# Patient Record
Sex: Female | Born: 1945 | Race: Black or African American | Hispanic: No | Smoking: Never smoker
Health system: Southern US, Community
[De-identification: ages and names within clinical notes are randomized; demographics above are authoritative.]

## PROBLEM LIST (undated history)

## (undated) DIAGNOSIS — F32A Depression, unspecified: Secondary | ICD-10-CM

## (undated) DIAGNOSIS — F329 Major depressive disorder, single episode, unspecified: Secondary | ICD-10-CM

## (undated) DIAGNOSIS — E119 Type 2 diabetes mellitus without complications: Secondary | ICD-10-CM

## (undated) DIAGNOSIS — E785 Hyperlipidemia, unspecified: Secondary | ICD-10-CM

## (undated) DIAGNOSIS — I10 Essential (primary) hypertension: Secondary | ICD-10-CM

## (undated) DIAGNOSIS — N022 Recurrent and persistent hematuria with diffuse membranous glomerulonephritis: Secondary | ICD-10-CM

## (undated) HISTORY — DX: Major depressive disorder, single episode, unspecified: F32.9

## (undated) HISTORY — DX: Hyperlipidemia, unspecified: E78.5

## (undated) HISTORY — DX: Depression, unspecified: F32.A

## (undated) HISTORY — DX: Type 2 diabetes mellitus without complications: E11.9

## (undated) HISTORY — DX: Recurrent and persistent hematuria with diffuse membranous glomerulonephritis: N02.2

## (undated) HISTORY — DX: Essential (primary) hypertension: I10

---

## 1997-08-25 ENCOUNTER — Other Ambulatory Visit: Admission: RE | Admit: 1997-08-25 | Discharge: 1997-08-25 | Payer: Self-pay | Admitting: Obstetrics

## 1997-09-23 ENCOUNTER — Observation Stay (HOSPITAL_COMMUNITY): Admission: AD | Admit: 1997-09-23 | Discharge: 1997-09-24 | Payer: Self-pay | Admitting: Diagnostic Radiology

## 1998-09-23 ENCOUNTER — Other Ambulatory Visit: Admission: RE | Admit: 1998-09-23 | Discharge: 1998-09-23 | Payer: Self-pay | Admitting: Obstetrics

## 1999-08-03 ENCOUNTER — Other Ambulatory Visit: Admission: RE | Admit: 1999-08-03 | Discharge: 1999-08-03 | Payer: Self-pay | Admitting: Obstetrics

## 1999-08-09 ENCOUNTER — Encounter: Payer: Self-pay | Admitting: Obstetrics

## 1999-08-09 ENCOUNTER — Ambulatory Visit (HOSPITAL_COMMUNITY): Admission: RE | Admit: 1999-08-09 | Discharge: 1999-08-09 | Payer: Self-pay | Admitting: Obstetrics

## 2000-01-04 ENCOUNTER — Encounter: Admission: RE | Admit: 2000-01-04 | Discharge: 2000-01-04 | Payer: Self-pay | Admitting: Obstetrics

## 2000-01-04 ENCOUNTER — Encounter: Payer: Self-pay | Admitting: Obstetrics

## 2001-02-07 ENCOUNTER — Encounter: Payer: Self-pay | Admitting: Obstetrics

## 2001-02-07 ENCOUNTER — Ambulatory Visit (HOSPITAL_COMMUNITY): Admission: RE | Admit: 2001-02-07 | Discharge: 2001-02-07 | Payer: Self-pay | Admitting: Obstetrics

## 2001-02-12 ENCOUNTER — Encounter: Payer: Self-pay | Admitting: Obstetrics

## 2001-02-12 ENCOUNTER — Ambulatory Visit (HOSPITAL_COMMUNITY): Admission: RE | Admit: 2001-02-12 | Discharge: 2001-02-12 | Payer: Self-pay | Admitting: Obstetrics

## 2002-09-12 ENCOUNTER — Emergency Department (HOSPITAL_COMMUNITY): Admission: EM | Admit: 2002-09-12 | Discharge: 2002-09-12 | Payer: Self-pay | Admitting: Emergency Medicine

## 2002-09-12 ENCOUNTER — Encounter: Payer: Self-pay | Admitting: Emergency Medicine

## 2004-04-06 ENCOUNTER — Other Ambulatory Visit: Admission: RE | Admit: 2004-04-06 | Discharge: 2004-04-06 | Payer: Self-pay | Admitting: Obstetrics and Gynecology

## 2006-08-17 ENCOUNTER — Emergency Department (HOSPITAL_COMMUNITY): Admission: EM | Admit: 2006-08-17 | Discharge: 2006-08-17 | Payer: Self-pay | Admitting: Family Medicine

## 2006-08-23 ENCOUNTER — Emergency Department (HOSPITAL_COMMUNITY): Admission: EM | Admit: 2006-08-23 | Discharge: 2006-08-23 | Payer: Self-pay | Admitting: Family Medicine

## 2008-03-05 ENCOUNTER — Ambulatory Visit: Payer: Self-pay | Admitting: Family Medicine

## 2008-03-16 ENCOUNTER — Ambulatory Visit: Payer: Self-pay | Admitting: *Deleted

## 2008-04-01 ENCOUNTER — Ambulatory Visit: Payer: Self-pay | Admitting: Family Medicine

## 2008-04-01 LAB — CONVERTED CEMR LAB
BUN: 17 mg/dL (ref 6–23)
Basophils Relative: 0 % (ref 0–1)
CO2: 22 meq/L (ref 19–32)
Calcium: 8.5 mg/dL (ref 8.4–10.5)
Chloride: 108 meq/L (ref 96–112)
Cholesterol: 398 mg/dL — ABNORMAL HIGH (ref 0–200)
Cocaine Metabolites: NEGATIVE
Creatinine, Ser: 0.81 mg/dL (ref 0.40–1.20)
Creatinine,U: 112.9 mg/dL
HDL: 73 mg/dL (ref >39)
Hemoglobin: 14.3 g/dL (ref 12.0–15.0)
Lymphocytes Relative: 38 % (ref 12–46)
MCHC: 34.5 g/dL (ref 30.0–36.0)
Marijuana Metabolite: POSITIVE — AB
Monocytes Absolute: 0.4 10*3/uL (ref 0.1–1.0)
Monocytes Relative: 6 % (ref 3–12)
Neutro Abs: 3.1 10*3/uL (ref 1.7–7.7)
Opiate Screen, Urine: NEGATIVE
Phencyclidine (PCP): NEGATIVE
RBC: 4.73 M/uL (ref 3.87–5.11)
Total CHOL/HDL Ratio: 5.5
Triglycerides: 326 mg/dL — ABNORMAL HIGH (ref <150)

## 2008-04-07 ENCOUNTER — Ambulatory Visit: Payer: Self-pay | Admitting: Internal Medicine

## 2008-04-21 ENCOUNTER — Encounter: Payer: Self-pay | Admitting: Family Medicine

## 2008-04-21 LAB — CONVERTED CEMR LAB
Creatinine 24 HR UR: 957 mg/24hr (ref 700–1800)
Creatinine Clearance: 82 mL/min (ref 75–115)

## 2008-04-29 ENCOUNTER — Ambulatory Visit: Payer: Self-pay | Admitting: Family Medicine

## 2008-05-07 ENCOUNTER — Ambulatory Visit: Payer: Self-pay | Admitting: Family Medicine

## 2008-05-25 ENCOUNTER — Ambulatory Visit: Payer: Self-pay | Admitting: Internal Medicine

## 2008-07-01 ENCOUNTER — Emergency Department (HOSPITAL_COMMUNITY): Admission: EM | Admit: 2008-07-01 | Discharge: 2008-07-01 | Payer: Self-pay | Admitting: Family Medicine

## 2008-07-27 ENCOUNTER — Ambulatory Visit: Payer: Self-pay | Admitting: Family Medicine

## 2008-07-27 LAB — CONVERTED CEMR LAB
AST: 28 units/L (ref 0–37)
Albumin: 2.7 g/dL — ABNORMAL LOW (ref 3.5–5.2)
Alkaline Phosphatase: 66 units/L (ref 39–117)
BUN: 14 mg/dL (ref 6–23)
HDL: 70 mg/dL (ref >39)
LDL Cholesterol: 132 mg/dL — ABNORMAL HIGH (ref 0–99)
Microalb, Ur: 379.4 mg/dL — ABNORMAL HIGH (ref 0.00–1.89)
Potassium: 4.4 meq/L (ref 3.5–5.3)
Sodium: 142 meq/L (ref 135–145)
Total Bilirubin: 0.5 mg/dL (ref 0.3–1.2)
VLDL: 25 mg/dL (ref 0–40)

## 2008-08-03 ENCOUNTER — Ambulatory Visit: Payer: Self-pay | Admitting: Family Medicine

## 2008-08-24 ENCOUNTER — Ambulatory Visit: Payer: Self-pay | Admitting: Internal Medicine

## 2008-08-25 ENCOUNTER — Ambulatory Visit: Payer: Self-pay | Admitting: Internal Medicine

## 2008-09-07 ENCOUNTER — Ambulatory Visit: Payer: Self-pay | Admitting: Internal Medicine

## 2008-09-09 ENCOUNTER — Ambulatory Visit (HOSPITAL_COMMUNITY): Admission: RE | Admit: 2008-09-09 | Discharge: 2008-09-09 | Payer: Self-pay | Admitting: Family Medicine

## 2008-09-09 ENCOUNTER — Ambulatory Visit: Payer: Self-pay | Admitting: Internal Medicine

## 2008-09-30 ENCOUNTER — Ambulatory Visit: Payer: Self-pay | Admitting: Internal Medicine

## 2008-10-09 ENCOUNTER — Ambulatory Visit (HOSPITAL_COMMUNITY): Admission: RE | Admit: 2008-10-09 | Discharge: 2008-10-09 | Payer: Self-pay | Admitting: Internal Medicine

## 2008-11-04 ENCOUNTER — Ambulatory Visit: Payer: Self-pay | Admitting: Internal Medicine

## 2008-11-12 ENCOUNTER — Ambulatory Visit (HOSPITAL_COMMUNITY): Admission: RE | Admit: 2008-11-12 | Discharge: 2008-11-12 | Payer: Self-pay | Admitting: Nephrology

## 2008-11-12 ENCOUNTER — Encounter (INDEPENDENT_AMBULATORY_CARE_PROVIDER_SITE_OTHER): Payer: Self-pay | Admitting: Diagnostic Radiology

## 2008-11-12 DIAGNOSIS — N022 Recurrent and persistent hematuria with diffuse membranous glomerulonephritis: Secondary | ICD-10-CM

## 2008-11-12 HISTORY — DX: Recurrent and persistent hematuria with diffuse membranous glomerulonephritis: N02.2

## 2009-03-23 ENCOUNTER — Ambulatory Visit: Payer: Self-pay | Admitting: Family Medicine

## 2009-04-12 ENCOUNTER — Encounter: Payer: Self-pay | Admitting: Family Medicine

## 2009-04-12 ENCOUNTER — Ambulatory Visit: Payer: Self-pay | Admitting: Family Medicine

## 2009-04-12 LAB — CONVERTED CEMR LAB
Albumin: 3.6 g/dL (ref 3.5–5.2)
Calcium: 8.9 mg/dL (ref 8.4–10.5)
Chloride: 108 meq/L (ref 96–112)
Collection Interval-CRCL: 24 hr
Creatinine Clearance: 127 mL/min — ABNORMAL HIGH (ref 75–115)
Creatinine, Ser: 0.74 mg/dL (ref 0.40–1.20)
Phosphorus: 4.8 mg/dL — ABNORMAL HIGH (ref 2.3–4.6)

## 2009-05-07 ENCOUNTER — Ambulatory Visit: Payer: Self-pay | Admitting: Family Medicine

## 2009-05-31 ENCOUNTER — Ambulatory Visit: Payer: Self-pay | Admitting: Internal Medicine

## 2009-05-31 ENCOUNTER — Encounter (INDEPENDENT_AMBULATORY_CARE_PROVIDER_SITE_OTHER): Payer: Self-pay | Admitting: Family Medicine

## 2009-05-31 LAB — CONVERTED CEMR LAB
Albumin: 3.5 g/dL (ref 3.5–5.2)
BUN: 18 mg/dL (ref 6–23)
CO2: 22 meq/L (ref 19–32)
Calcium: 8.5 mg/dL (ref 8.4–10.5)
Chloride: 109 meq/L (ref 96–112)
Creatinine, Ser: 0.75 mg/dL (ref 0.40–1.20)
Creatinine, Urine: 94.8 mg/dL
Glucose, Bld: 172 mg/dL — ABNORMAL HIGH (ref 70–99)
Phosphorus: 3.8 mg/dL (ref 2.3–4.6)
Potassium: 4.3 meq/L (ref 3.5–5.3)
Total Protein, Urine: 245

## 2009-08-24 ENCOUNTER — Ambulatory Visit: Payer: Self-pay | Admitting: Family Medicine

## 2009-08-24 LAB — CONVERTED CEMR LAB: Microalb, Ur: 195.91 mg/dL — ABNORMAL HIGH (ref 0.00–1.89)

## 2009-09-24 ENCOUNTER — Ambulatory Visit (HOSPITAL_COMMUNITY): Admission: RE | Admit: 2009-09-24 | Discharge: 2009-09-24 | Payer: Self-pay | Admitting: Internal Medicine

## 2009-09-29 ENCOUNTER — Encounter (INDEPENDENT_AMBULATORY_CARE_PROVIDER_SITE_OTHER): Payer: Self-pay | Admitting: Family Medicine

## 2009-09-29 ENCOUNTER — Ambulatory Visit: Payer: Self-pay | Admitting: Internal Medicine

## 2009-09-29 LAB — CONVERTED CEMR LAB
Albumin: 4.3 g/dL (ref 3.5–5.2)
BUN: 29 mg/dL — ABNORMAL HIGH (ref 6–23)
Creatinine, Ser: 0.96 mg/dL (ref 0.40–1.20)
Hgb A1c MFr Bld: 6.6 % — ABNORMAL HIGH (ref <5.7)
Phosphorus: 5 mg/dL — ABNORMAL HIGH (ref 2.3–4.6)

## 2009-11-30 ENCOUNTER — Ambulatory Visit: Payer: Self-pay | Admitting: Family Medicine

## 2010-02-04 ENCOUNTER — Encounter (INDEPENDENT_AMBULATORY_CARE_PROVIDER_SITE_OTHER): Payer: Self-pay | Admitting: Family Medicine

## 2010-02-04 ENCOUNTER — Other Ambulatory Visit: Admission: RE | Admit: 2010-02-04 | Discharge: 2010-02-04 | Payer: Self-pay | Admitting: Family Medicine

## 2010-02-04 LAB — CONVERTED CEMR LAB
Albumin: 4.3 g/dL (ref 3.5–5.2)
BUN: 15 mg/dL (ref 6–23)
CO2: 29 meq/L (ref 19–32)
Calcium: 9.3 mg/dL (ref 8.4–10.5)
Glucose, Bld: 113 mg/dL — ABNORMAL HIGH (ref 70–99)
Total Protein, Urine: 102

## 2010-02-17 ENCOUNTER — Encounter (INDEPENDENT_AMBULATORY_CARE_PROVIDER_SITE_OTHER): Payer: Self-pay | Admitting: Family Medicine

## 2010-02-17 LAB — CONVERTED CEMR LAB
CO2: 25 meq/L (ref 19–32)
Calcium: 9.4 mg/dL (ref 8.4–10.5)
Chloride: 106 meq/L (ref 96–112)
Glucose, Bld: 95 mg/dL (ref 70–99)
HDL: 52 mg/dL (ref >39)
Hgb A1c MFr Bld: 6 % — ABNORMAL HIGH (ref <5.7)
LDL Cholesterol: 109 mg/dL — ABNORMAL HIGH (ref 0–99)
Sodium: 141 meq/L (ref 135–145)
Total CHOL/HDL Ratio: 3.9

## 2010-03-03 ENCOUNTER — Ambulatory Visit: Payer: Self-pay | Admitting: Family Medicine

## 2010-06-08 ENCOUNTER — Encounter (INDEPENDENT_AMBULATORY_CARE_PROVIDER_SITE_OTHER): Payer: Self-pay | Admitting: Family Medicine

## 2010-06-08 LAB — CONVERTED CEMR LAB
ALT: 26 units/L (ref 0–35)
AST: 30 units/L (ref 0–37)
Alkaline Phosphatase: 51 units/L (ref 39–117)
CO2: 26 meq/L (ref 19–32)
Creatinine, Ser: 0.85 mg/dL (ref 0.40–1.20)
LDL Cholesterol: 63 mg/dL (ref 0–99)
Phosphorus: 4.4 mg/dL (ref 2.3–4.6)
Sodium: 142 meq/L (ref 135–145)
Total Bilirubin: 0.9 mg/dL (ref 0.3–1.2)
Total CHOL/HDL Ratio: 2.5
Total Protein: 6.7 g/dL (ref 6.0–8.3)
VLDL: 22 mg/dL (ref 0–40)

## 2010-06-10 ENCOUNTER — Encounter (INDEPENDENT_AMBULATORY_CARE_PROVIDER_SITE_OTHER): Payer: Self-pay | Admitting: Family Medicine

## 2010-06-10 LAB — CONVERTED CEMR LAB
ALT: 28 units/L (ref 0–35)
AST: 34 units/L (ref 0–37)
Albumin: 4.3 g/dL (ref 3.5–5.2)
Alkaline Phosphatase: 52 units/L (ref 39–117)
Creatinine, Urine: 53.5 mg/dL
Glucose, Bld: 75 mg/dL (ref 70–99)
Potassium: 4.9 meq/L (ref 3.5–5.3)
Protein, 24H Urine: 1554 mg/24hr — ABNORMAL HIGH (ref 50–100)
Sodium: 141 meq/L (ref 135–145)
Total Bilirubin: 0.7 mg/dL (ref 0.3–1.2)
Total Protein: 7 g/dL (ref 6.0–8.3)

## 2010-06-13 ENCOUNTER — Encounter: Payer: Self-pay | Admitting: Internal Medicine

## 2010-07-19 ENCOUNTER — Ambulatory Visit (HOSPITAL_COMMUNITY)
Admission: RE | Admit: 2010-07-19 | Discharge: 2010-07-19 | Disposition: A | Discharge status: Home / Self Care | Payer: Medicare Other | Source: Ambulatory Visit | Attending: Gastroenterology | Admitting: Gastroenterology

## 2010-07-19 ENCOUNTER — Other Ambulatory Visit: Payer: Self-pay | Admitting: Gastroenterology

## 2010-07-19 DIAGNOSIS — D126 Benign neoplasm of colon, unspecified: Secondary | ICD-10-CM | POA: Insufficient documentation

## 2010-07-19 DIAGNOSIS — Z01812 Encounter for preprocedural laboratory examination: Secondary | ICD-10-CM | POA: Insufficient documentation

## 2010-07-19 DIAGNOSIS — Z1211 Encounter for screening for malignant neoplasm of colon: Secondary | ICD-10-CM | POA: Insufficient documentation

## 2010-07-19 LAB — GLUCOSE, CAPILLARY: Glucose-Capillary: 109 mg/dL — ABNORMAL HIGH (ref 70–99)

## 2010-08-29 LAB — CBC
Hemoglobin: 12.8 g/dL (ref 12.0–15.0)
RDW: 13.6 % (ref 11.5–15.5)
WBC: 5.9 10*3/uL (ref 4.0–10.5)

## 2010-08-29 LAB — APTT: aPTT: 27 seconds (ref 24–37)

## 2010-08-29 LAB — GLUCOSE, CAPILLARY: Glucose-Capillary: 101 mg/dL — ABNORMAL HIGH (ref 70–99)

## 2010-08-29 LAB — PROTIME-INR: INR: 1 (ref 0.00–1.49)

## 2010-10-25 ENCOUNTER — Other Ambulatory Visit: Payer: Self-pay | Admitting: Family Medicine

## 2011-02-02 IMAGING — US US RENAL
1 series · 14 of 21 positions shown · non-contrast
Comparison: None

CLINICAL DATA: Proteinuria

RENAL/URINARY TRACT ULTRASOUND COMPLETE

[Series 1: us renal · 0.28mm/px · 14 of 21 slices shown]
[im 1/21]
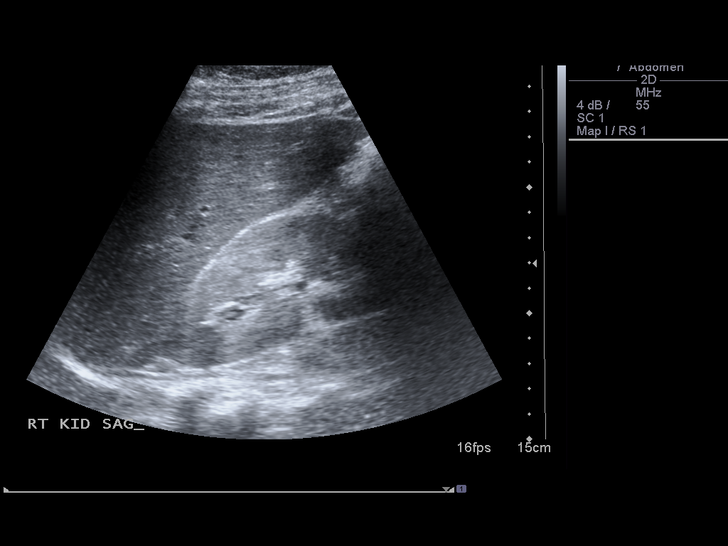
[im 3/21]
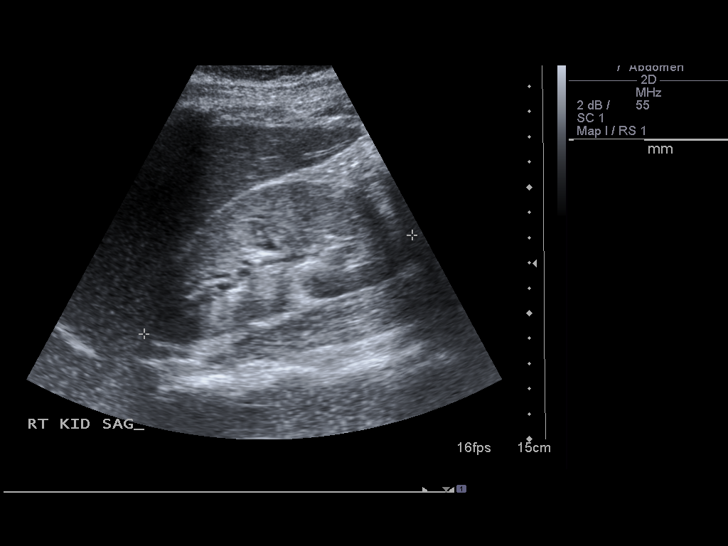
[im 4/21]
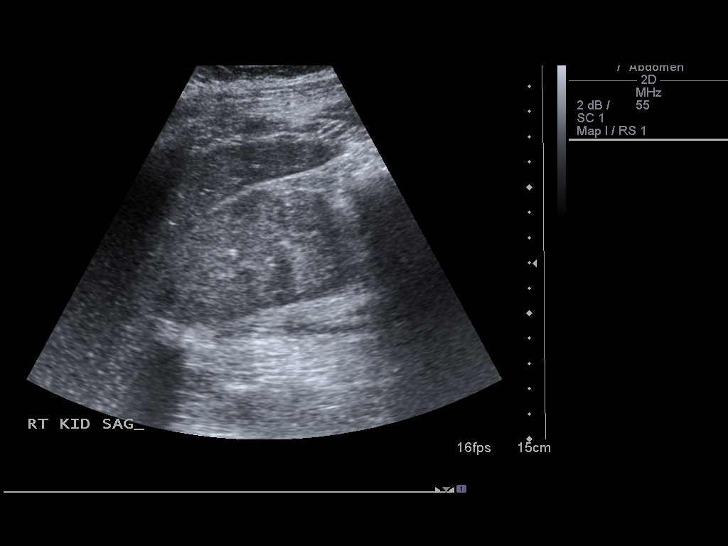
[im 6/21]
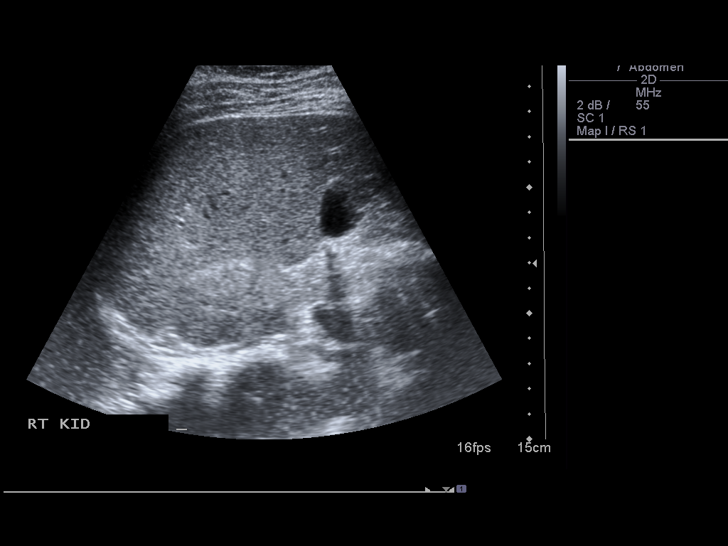
[im 7/21]
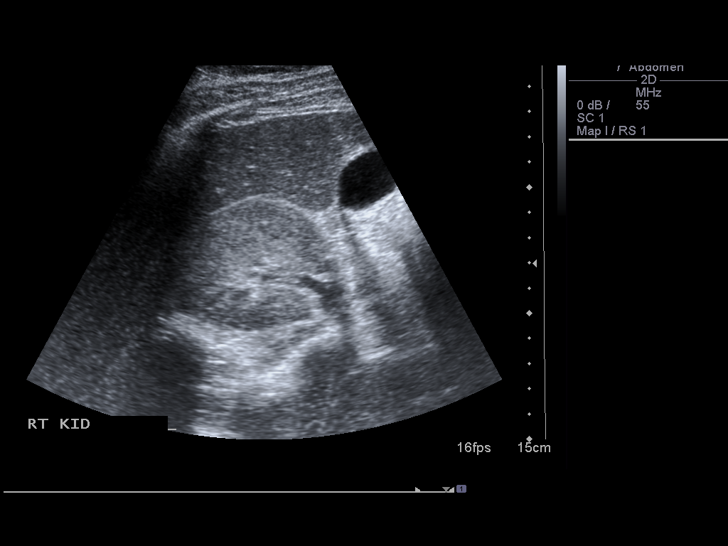
[im 9/21]
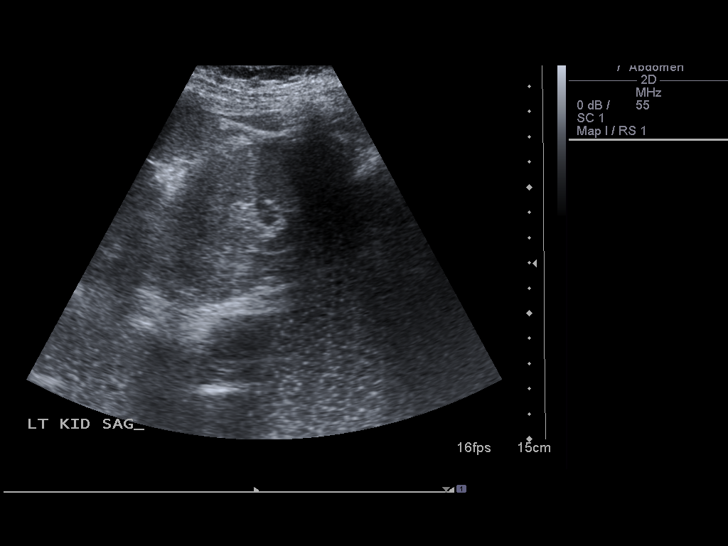
[im 10/21]
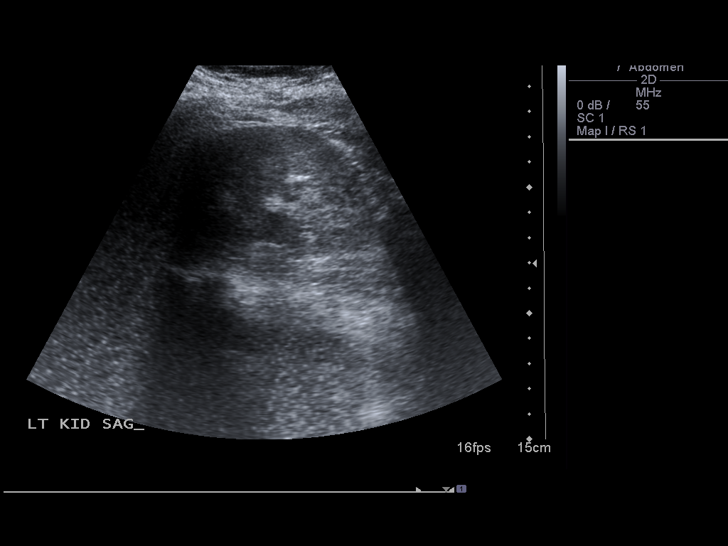
[im 12/21]
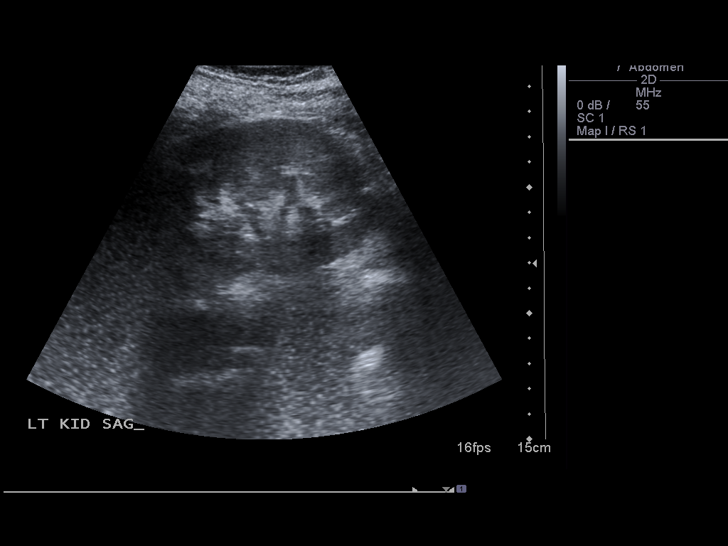
[im 13/21]
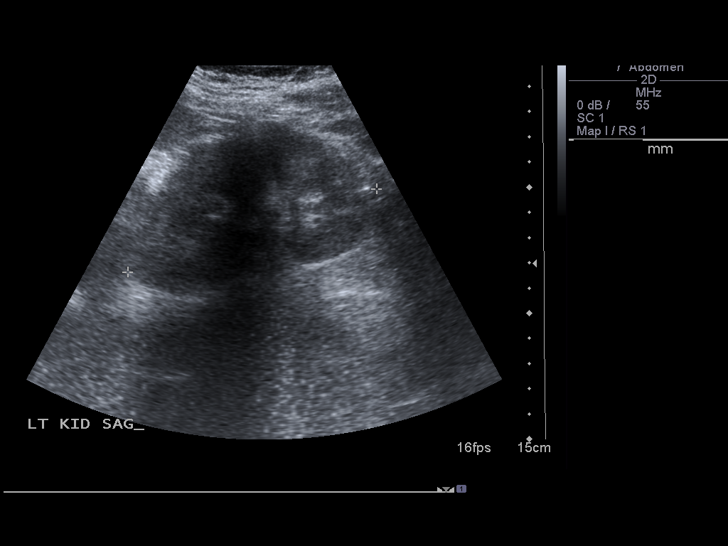
[im 15/21]
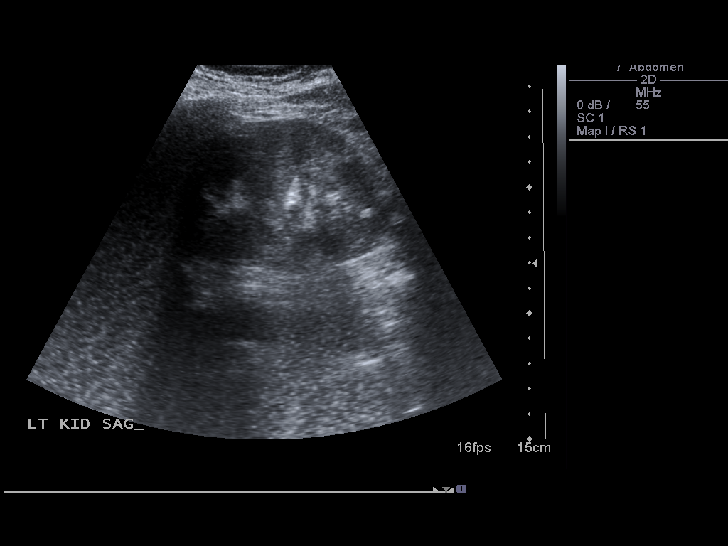
[im 16/21]
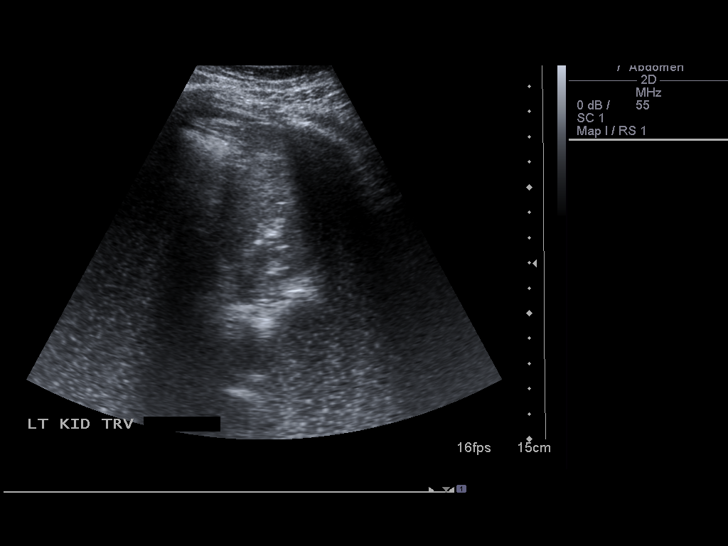
[im 18/21]
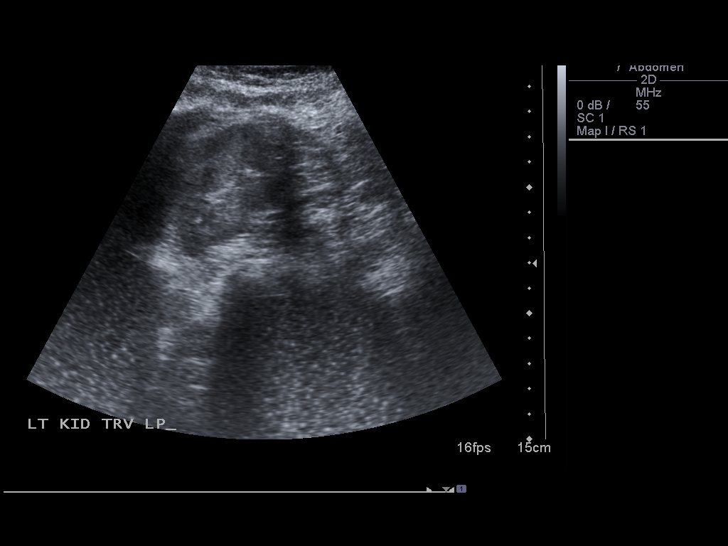
[im 19/21]
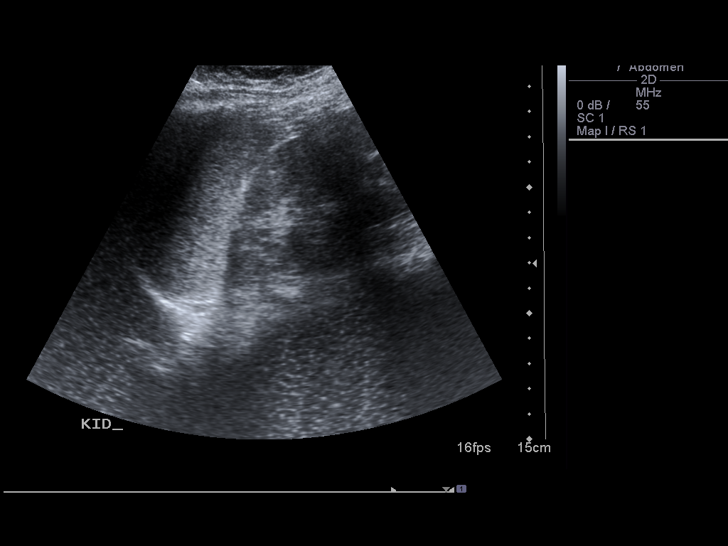
[im 21/21]
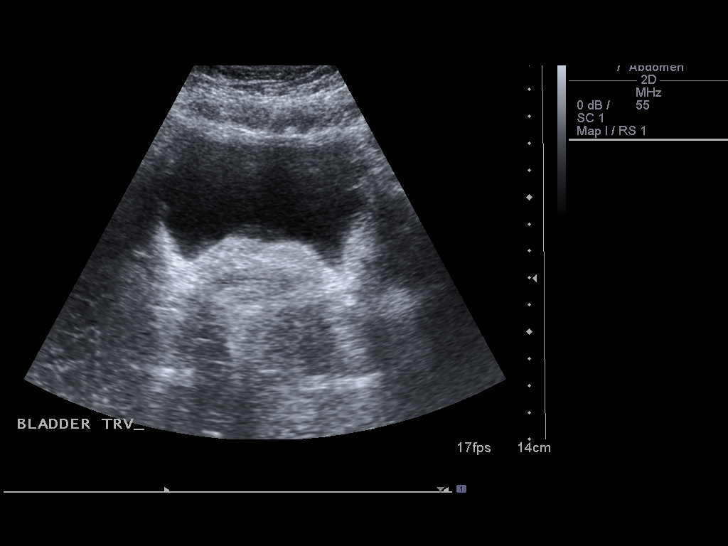

[14 of 21 positions shown; findings below may reference images not displayed]

FINDINGS: Right Kidney:  Normal in size and parenchymal echogenicity.  No
evidence of mass or hydronephrosis.

Left Kidney:  Normal in size and parenchymal echogenicity.  No
evidence of mass or hydronephrosis.

Bladder:  Appears normal for degree of bladder distention.
IMPRESSION: Normal study.

## 2011-11-07 ENCOUNTER — Other Ambulatory Visit (HOSPITAL_COMMUNITY)
Admission: RE | Admit: 2011-11-07 | Discharge: 2011-11-07 | Disposition: A | Discharge status: Home / Self Care | Payer: Medicare Other | Source: Ambulatory Visit | Attending: Family Medicine | Admitting: Family Medicine

## 2011-11-07 ENCOUNTER — Other Ambulatory Visit (HOSPITAL_COMMUNITY): Payer: Self-pay | Admitting: Family Medicine

## 2011-11-07 ENCOUNTER — Other Ambulatory Visit: Payer: Self-pay

## 2011-11-07 DIAGNOSIS — Z124 Encounter for screening for malignant neoplasm of cervix: Secondary | ICD-10-CM | POA: Insufficient documentation

## 2011-11-07 DIAGNOSIS — Z1231 Encounter for screening mammogram for malignant neoplasm of breast: Secondary | ICD-10-CM

## 2011-12-01 ENCOUNTER — Ambulatory Visit (HOSPITAL_COMMUNITY)
Admission: RE | Admit: 2011-12-01 | Discharge: 2011-12-01 | Disposition: A | Discharge status: Home / Self Care | Payer: Medicare Other | Source: Ambulatory Visit | Attending: Family Medicine | Admitting: Family Medicine

## 2011-12-01 DIAGNOSIS — Z1231 Encounter for screening mammogram for malignant neoplasm of breast: Secondary | ICD-10-CM | POA: Insufficient documentation

## 2011-12-06 ENCOUNTER — Other Ambulatory Visit: Payer: Self-pay | Admitting: Family Medicine

## 2011-12-06 DIAGNOSIS — R928 Other abnormal and inconclusive findings on diagnostic imaging of breast: Secondary | ICD-10-CM

## 2011-12-18 ENCOUNTER — Ambulatory Visit
Admission: RE | Admit: 2011-12-18 | Discharge: 2011-12-18 | Disposition: A | Discharge status: Home / Self Care | Payer: Medicare Other | Source: Ambulatory Visit | Attending: Family Medicine | Admitting: Family Medicine

## 2011-12-18 DIAGNOSIS — R928 Other abnormal and inconclusive findings on diagnostic imaging of breast: Secondary | ICD-10-CM

## 2013-02-21 ENCOUNTER — Other Ambulatory Visit: Payer: Self-pay

## 2013-02-21 DIAGNOSIS — Z1231 Encounter for screening mammogram for malignant neoplasm of breast: Secondary | ICD-10-CM

## 2013-02-26 ENCOUNTER — Ambulatory Visit
Admission: RE | Admit: 2013-02-26 | Discharge: 2013-02-26 | Disposition: A | Discharge status: Home / Self Care | Payer: Medicare Other | Source: Ambulatory Visit

## 2013-02-26 DIAGNOSIS — Z1231 Encounter for screening mammogram for malignant neoplasm of breast: Secondary | ICD-10-CM

## 2013-03-31 ENCOUNTER — Encounter: Payer: Self-pay | Admitting: *Deleted

## 2013-05-27 ENCOUNTER — Encounter (INDEPENDENT_AMBULATORY_CARE_PROVIDER_SITE_OTHER): Payer: Self-pay

## 2013-05-27 ENCOUNTER — Ambulatory Visit (HOSPITAL_COMMUNITY)
Admission: RE | Admit: 2013-05-27 | Discharge: 2013-05-27 | Disposition: A | Discharge status: Home / Self Care | Payer: Medicare HMO | Source: Ambulatory Visit | Attending: Internal Medicine | Admitting: Internal Medicine

## 2013-05-27 ENCOUNTER — Other Ambulatory Visit: Payer: Self-pay | Admitting: Internal Medicine

## 2013-05-27 DIAGNOSIS — R52 Pain, unspecified: Secondary | ICD-10-CM

## 2013-05-27 DIAGNOSIS — M25569 Pain in unspecified knee: Secondary | ICD-10-CM | POA: Insufficient documentation

## 2014-04-01 ENCOUNTER — Other Ambulatory Visit: Payer: Self-pay

## 2014-04-01 DIAGNOSIS — Z1231 Encounter for screening mammogram for malignant neoplasm of breast: Secondary | ICD-10-CM

## 2014-05-05 ENCOUNTER — Ambulatory Visit
Admission: RE | Admit: 2014-05-05 | Discharge: 2014-05-05 | Disposition: A | Discharge status: Home / Self Care | Payer: Medicare HMO | Source: Ambulatory Visit

## 2014-05-05 DIAGNOSIS — Z1231 Encounter for screening mammogram for malignant neoplasm of breast: Secondary | ICD-10-CM

## 2015-05-05 DIAGNOSIS — I10 Essential (primary) hypertension: Secondary | ICD-10-CM | POA: Diagnosis not present

## 2015-05-05 DIAGNOSIS — E559 Vitamin D deficiency, unspecified: Secondary | ICD-10-CM | POA: Diagnosis not present

## 2015-05-05 DIAGNOSIS — M150 Primary generalized (osteo)arthritis: Secondary | ICD-10-CM | POA: Diagnosis not present

## 2015-05-05 DIAGNOSIS — E119 Type 2 diabetes mellitus without complications: Secondary | ICD-10-CM | POA: Diagnosis not present

## 2015-05-14 DIAGNOSIS — Z0001 Encounter for general adult medical examination with abnormal findings: Secondary | ICD-10-CM | POA: Diagnosis not present

## 2015-06-28 ENCOUNTER — Other Ambulatory Visit: Payer: Self-pay

## 2015-06-28 DIAGNOSIS — Z1231 Encounter for screening mammogram for malignant neoplasm of breast: Secondary | ICD-10-CM

## 2015-07-06 ENCOUNTER — Ambulatory Visit
Admission: RE | Admit: 2015-07-06 | Discharge: 2015-07-06 | Disposition: A | Discharge status: Home / Self Care | Payer: Medicare HMO | Source: Ambulatory Visit

## 2015-07-06 DIAGNOSIS — Z1231 Encounter for screening mammogram for malignant neoplasm of breast: Secondary | ICD-10-CM | POA: Diagnosis not present

## 2015-11-02 DIAGNOSIS — I129 Hypertensive chronic kidney disease with stage 1 through stage 4 chronic kidney disease, or unspecified chronic kidney disease: Secondary | ICD-10-CM | POA: Diagnosis not present

## 2015-11-02 DIAGNOSIS — E669 Obesity, unspecified: Secondary | ICD-10-CM | POA: Diagnosis not present

## 2015-11-02 DIAGNOSIS — E785 Hyperlipidemia, unspecified: Secondary | ICD-10-CM | POA: Diagnosis not present

## 2015-11-02 DIAGNOSIS — N052 Unspecified nephritic syndrome with diffuse membranous glomerulonephritis: Secondary | ICD-10-CM | POA: Diagnosis not present

## 2015-11-02 DIAGNOSIS — E119 Type 2 diabetes mellitus without complications: Secondary | ICD-10-CM | POA: Diagnosis not present

## 2016-02-14 DIAGNOSIS — E559 Vitamin D deficiency, unspecified: Secondary | ICD-10-CM | POA: Diagnosis not present

## 2016-02-14 DIAGNOSIS — I10 Essential (primary) hypertension: Secondary | ICD-10-CM | POA: Diagnosis not present

## 2016-02-14 DIAGNOSIS — Z0001 Encounter for general adult medical examination with abnormal findings: Secondary | ICD-10-CM | POA: Diagnosis not present

## 2016-02-14 DIAGNOSIS — R7302 Impaired glucose tolerance (oral): Secondary | ICD-10-CM | POA: Diagnosis not present

## 2016-02-14 LAB — VITAMIN D 25 HYDROXY (VIT D DEFICIENCY, FRACTURES): Vit D, 25-Hydroxy: 23.5

## 2016-02-14 LAB — BASIC METABOLIC PANEL
BUN: 19 (ref 4–21)
CREATININE: 0.9 (ref 0.5–1.1)
Glucose: 155
POTASSIUM: 4.2 (ref 3.4–5.3)
Sodium: 145 (ref 137–147)

## 2016-02-14 LAB — CBC AND DIFFERENTIAL
HCT: 37 (ref 36–46)
HEMOGLOBIN: 12.4 (ref 12.0–16.0)
Platelets: 282 (ref 150–399)
WBC: 6.4

## 2016-02-14 LAB — LIPID PANEL
CHOLESTEROL: 207 — AB (ref 0–200)
HDL: 45 (ref 35–70)
LDL Cholesterol: 120
TRIGLYCERIDES: 211 — AB (ref 40–160)

## 2016-02-14 LAB — HEMOGLOBIN A1C: Hemoglobin A1C: 6

## 2016-02-14 LAB — TSH: TSH: 1.84 (ref 0.41–5.90)

## 2016-02-17 DIAGNOSIS — E119 Type 2 diabetes mellitus without complications: Secondary | ICD-10-CM | POA: Diagnosis not present

## 2016-02-17 DIAGNOSIS — I10 Essential (primary) hypertension: Secondary | ICD-10-CM | POA: Diagnosis not present

## 2016-03-08 DIAGNOSIS — Z23 Encounter for immunization: Secondary | ICD-10-CM | POA: Diagnosis not present

## 2016-04-08 DIAGNOSIS — Z1211 Encounter for screening for malignant neoplasm of colon: Secondary | ICD-10-CM | POA: Diagnosis not present

## 2016-05-31 DIAGNOSIS — M25511 Pain in right shoulder: Secondary | ICD-10-CM | POA: Diagnosis not present

## 2016-05-31 DIAGNOSIS — I10 Essential (primary) hypertension: Secondary | ICD-10-CM | POA: Diagnosis not present

## 2016-05-31 DIAGNOSIS — E119 Type 2 diabetes mellitus without complications: Secondary | ICD-10-CM | POA: Diagnosis not present

## 2016-06-26 DIAGNOSIS — E119 Type 2 diabetes mellitus without complications: Secondary | ICD-10-CM | POA: Diagnosis not present

## 2016-08-16 ENCOUNTER — Other Ambulatory Visit: Payer: Self-pay | Admitting: Internal Medicine

## 2016-08-16 DIAGNOSIS — Z1231 Encounter for screening mammogram for malignant neoplasm of breast: Secondary | ICD-10-CM

## 2016-09-05 ENCOUNTER — Ambulatory Visit
Admission: RE | Admit: 2016-09-05 | Discharge: 2016-09-05 | Disposition: A | Discharge status: Home / Self Care | Payer: Medicare HMO | Source: Ambulatory Visit | Attending: Internal Medicine | Admitting: Internal Medicine

## 2016-09-05 DIAGNOSIS — Z1231 Encounter for screening mammogram for malignant neoplasm of breast: Secondary | ICD-10-CM | POA: Diagnosis not present

## 2016-09-26 DIAGNOSIS — E559 Vitamin D deficiency, unspecified: Secondary | ICD-10-CM | POA: Diagnosis not present

## 2016-09-26 DIAGNOSIS — E119 Type 2 diabetes mellitus without complications: Secondary | ICD-10-CM | POA: Diagnosis not present

## 2016-09-26 DIAGNOSIS — I10 Essential (primary) hypertension: Secondary | ICD-10-CM | POA: Diagnosis not present

## 2016-09-26 DIAGNOSIS — E1165 Type 2 diabetes mellitus with hyperglycemia: Secondary | ICD-10-CM | POA: Diagnosis not present

## 2016-09-26 LAB — HEMOGLOBIN A1C: Hemoglobin A1C: 6.8

## 2016-09-26 LAB — CBC AND DIFFERENTIAL
HCT: 36 (ref 36–46)
Hemoglobin: 12.7 (ref 12.0–16.0)
NEUTROS ABS: 4
PLATELETS: 290 (ref 150–399)
WBC: 6.9

## 2016-09-26 LAB — BASIC METABOLIC PANEL
BUN: 15 (ref 4–21)
Creatinine: 0.9 (ref 0.5–1.1)
GLUCOSE: 164
Potassium: 3.5 (ref 3.4–5.3)
Sodium: 140 (ref 137–147)

## 2016-09-26 LAB — LIPID PANEL
Cholesterol: 136 (ref 0–200)
HDL: 50 (ref 35–70)
LDL CALC: 59
Triglycerides: 137 (ref 40–160)

## 2016-09-26 LAB — TSH: TSH: 2.16 (ref 0.41–5.90)

## 2016-09-26 LAB — VITAMIN D 25 HYDROXY (VIT D DEFICIENCY, FRACTURES): VIT D 25 HYDROXY: 37.9

## 2016-10-03 DIAGNOSIS — I10 Essential (primary) hypertension: Secondary | ICD-10-CM | POA: Diagnosis not present

## 2016-10-03 DIAGNOSIS — E119 Type 2 diabetes mellitus without complications: Secondary | ICD-10-CM | POA: Diagnosis not present

## 2016-10-09 DIAGNOSIS — I10 Essential (primary) hypertension: Secondary | ICD-10-CM | POA: Diagnosis not present

## 2016-10-09 DIAGNOSIS — E1165 Type 2 diabetes mellitus with hyperglycemia: Secondary | ICD-10-CM | POA: Diagnosis not present

## 2017-01-08 DIAGNOSIS — E1165 Type 2 diabetes mellitus with hyperglycemia: Secondary | ICD-10-CM | POA: Diagnosis not present

## 2017-01-08 DIAGNOSIS — E119 Type 2 diabetes mellitus without complications: Secondary | ICD-10-CM | POA: Diagnosis not present

## 2017-01-08 DIAGNOSIS — I10 Essential (primary) hypertension: Secondary | ICD-10-CM | POA: Diagnosis not present

## 2017-01-08 LAB — HEMOGLOBIN A1C: Hemoglobin A1C: 6.3

## 2017-01-08 LAB — BASIC METABOLIC PANEL
BUN: 16 (ref 4–21)
CREATININE: 0.8 (ref 0.5–1.1)
Glucose: 130
Potassium: 4.2 (ref 3.4–5.3)
Sodium: 145 (ref 137–147)

## 2017-01-08 LAB — TSH: TSH: 2.27 (ref 0.41–5.90)

## 2017-01-17 DIAGNOSIS — N052 Unspecified nephritic syndrome with diffuse membranous glomerulonephritis: Secondary | ICD-10-CM | POA: Diagnosis not present

## 2017-01-17 DIAGNOSIS — E785 Hyperlipidemia, unspecified: Secondary | ICD-10-CM | POA: Diagnosis not present

## 2017-01-17 DIAGNOSIS — E119 Type 2 diabetes mellitus without complications: Secondary | ICD-10-CM | POA: Diagnosis not present

## 2017-01-17 DIAGNOSIS — I129 Hypertensive chronic kidney disease with stage 1 through stage 4 chronic kidney disease, or unspecified chronic kidney disease: Secondary | ICD-10-CM | POA: Diagnosis not present

## 2017-01-17 DIAGNOSIS — E669 Obesity, unspecified: Secondary | ICD-10-CM | POA: Diagnosis not present

## 2017-03-21 DIAGNOSIS — Z23 Encounter for immunization: Secondary | ICD-10-CM | POA: Diagnosis not present

## 2017-03-21 DIAGNOSIS — Z0001 Encounter for general adult medical examination with abnormal findings: Secondary | ICD-10-CM | POA: Diagnosis not present

## 2017-04-03 DIAGNOSIS — N052 Unspecified nephritic syndrome with diffuse membranous glomerulonephritis: Secondary | ICD-10-CM | POA: Diagnosis not present

## 2017-07-02 ENCOUNTER — Other Ambulatory Visit: Payer: Medicare HMO

## 2017-07-04 ENCOUNTER — Ambulatory Visit (INDEPENDENT_AMBULATORY_CARE_PROVIDER_SITE_OTHER): Payer: Medicare HMO | Admitting: Family Medicine

## 2017-07-04 ENCOUNTER — Encounter: Payer: Self-pay | Admitting: Family Medicine

## 2017-07-04 VITALS — BP 128/86 | HR 65 | Ht 63.0 in | Wt 167.7 lb

## 2017-07-04 DIAGNOSIS — I10 Essential (primary) hypertension: Secondary | ICD-10-CM

## 2017-07-04 DIAGNOSIS — N183 Chronic kidney disease, stage 3 unspecified: Secondary | ICD-10-CM

## 2017-07-04 DIAGNOSIS — E663 Overweight: Secondary | ICD-10-CM

## 2017-07-04 DIAGNOSIS — E1169 Type 2 diabetes mellitus with other specified complication: Secondary | ICD-10-CM | POA: Insufficient documentation

## 2017-07-04 DIAGNOSIS — E0822 Diabetes mellitus due to underlying condition with diabetic chronic kidney disease: Secondary | ICD-10-CM | POA: Diagnosis not present

## 2017-07-04 DIAGNOSIS — R809 Proteinuria, unspecified: Secondary | ICD-10-CM | POA: Insufficient documentation

## 2017-07-04 DIAGNOSIS — E118 Type 2 diabetes mellitus with unspecified complications: Secondary | ICD-10-CM | POA: Insufficient documentation

## 2017-07-04 DIAGNOSIS — E1129 Type 2 diabetes mellitus with other diabetic kidney complication: Secondary | ICD-10-CM | POA: Diagnosis not present

## 2017-07-04 DIAGNOSIS — E119 Type 2 diabetes mellitus without complications: Secondary | ICD-10-CM

## 2017-07-04 DIAGNOSIS — E559 Vitamin D deficiency, unspecified: Secondary | ICD-10-CM | POA: Diagnosis not present

## 2017-07-04 DIAGNOSIS — I152 Hypertension secondary to endocrine disorders: Secondary | ICD-10-CM | POA: Insufficient documentation

## 2017-07-04 DIAGNOSIS — E1159 Type 2 diabetes mellitus with other circulatory complications: Secondary | ICD-10-CM | POA: Diagnosis not present

## 2017-07-04 DIAGNOSIS — R69 Illness, unspecified: Secondary | ICD-10-CM | POA: Diagnosis not present

## 2017-07-04 DIAGNOSIS — F39 Unspecified mood [affective] disorder: Secondary | ICD-10-CM | POA: Diagnosis not present

## 2017-07-04 DIAGNOSIS — E782 Mixed hyperlipidemia: Secondary | ICD-10-CM

## 2017-07-04 LAB — POCT GLYCOSYLATED HEMOGLOBIN (HGB A1C): Hemoglobin A1C: 6.9

## 2017-07-04 NOTE — Progress Notes (Signed)
New patient office visit note:  Impression and Recommendations:    1. Hypertension associated with diabetes ([place])   2. Diabetes mellitus without complication ([place])   3. Diabetes mellitus due to underlying condition with stage 3 chronic kidney disease, without long-term current use of insulin ([place])   4. Microalbuminuria due to type 2 diabetes mellitus ([place])   5. Mixed diabetic hyperlipidemia associated with type 2 diabetes mellitus ([place])   6. Mood disorder ([place])   7. Overweight (BMI 25.0-29.9)   8. Vitamin D insufficiency     1. Diabetes - Advised the patient that an A1C of 6.9 is well controlled for her age.    - However, with her added kidney disease, advised the patient that the better we control her blood sugars, the better her kidney health will be.  Our goal is to prevent worsening kidney disease.  - Discussed with the patient that metformin may be a better medicine to use to control her sugars.    - check her fasting blood sugars on the glucometer regularly, especially when she eats especially bad, or especially good.  Obtain fasting blood sugar readings after a "good meal for dinner" vs a "bad meal for dinner."  If she feels enthusiastic about it, she may also record some 2-hour post-prandial readings.  - Patient will keep a blood glucose log and bring this to her next appointment.  - Noted that alcohol is sugar, and cutting down on alcohol should help to control her sugars.   2. Ambulatory Referral to Opthalmology - She should continue getting routine diabetic eye exams.  3. Kidney Disease - Advised the patient that the better we can control her blood sugar and blood pressure, the better her kidney health will be.  - Reviewed that proteinuria can be caused by both diabetes and high blood pressure.  - Reviewed that losartan can help protect the kidneys, and she currently takes losartan.  4. HTN - We will not make any medication changes at this time, especially  given the fact that she regularly sees nephrology for her blood pressure.  - Per patient, she takes her losartan, but only took HCTZ as needed for swelling in her legs, and only took it occasionally.  - Reviewed that her goal blood pressure should fall consistently under 130/80.  Patient should watch for dizziness or lightheadedness - make sure she does not feel these symptoms.  With her medical history, given her diabetes and kidney concerns, she should have a blood pressure consistently under 130/80.  - To control her blood pressure, recommend meditation, weight loss, diet, exercise, and potentially altered dosage or different medicines.  We will monitor and evaluate this in the future.  - Advised the patient to take her medications as prescribed, every day.  - Patient should check her blood pressure every day, and keep a log to bring in to her next appointment.  Prior to taking her blood pressure readings at home, she should sit for 15 minutes, resting quietly.  She should also not consume caffeine prior to taking her blood pressure readings.  - Educational handout on taking blood pressure readings provided to the patient today.  5. Cholesterol - Advised the patient that she should be taking her Lipitor every night before bed.  6. General health - Advised the patient to continue her active habits, keep up with her exercise and outdoor activities.  7. Left Knee Pain - Since patient mentioned this when we were walking out of the  office visit, advice given to patient and we will bring her back to fully evaluate in the future as needed.  Told patient she is more than welcome to do that in the future as desired.  - If arthritis in her knee flares up, ice for 15-20 minutes.  Also apply ice after exercise, going on the trail, going up and down stairs, to help with the pain.  If the pain doesn't subside, she may add Tylenol.  If the pain still doesn't subside, even after proper treatment, she will  make an appointment to evaluate it.  8. Follow-Up - Patient will return in the near future for fasting blood work, to establish baseline.  Patient knows to come in at her convenience.  - The patient would like to come in only if there's a problem with her lab work.  - Otherwise, we will see her back in 3 months for a check of her A1C and blood pressure, and OV to follow.  - If things are not controlled, she will come in to the clinic sooner than planned.    Education and routine counseling performed. Handouts provided.  Orders Placed This Encounter  Procedures  . CBC with Differential/Platelet  . Comprehensive metabolic panel  . Lipid panel  . Magnesium  . Phosphorus  . TSH  . T4, free  . VITAMIN D 25 Hydroxy (Vit-D Deficiency, Fractures)  . Microalbumin / creatinine urine ratio  . Ambulatory referral to Ophthalmology  . POCT glycosylated hemoglobin (Hb A1C)   Gross side effects, risk and benefits, and alternatives of medications discussed with patient.  Patient is aware that all medications have potential side effects and we are unable to predict every side effect or drug-drug interaction that may occur.  Expresses verbal understanding and consents to current therapy plan and treatment regimen.  Return for diabetes and blood pressure follow up every 1mo;  near future come in for fbw only.  Please see AVS handed out to patient at the end of our visit for further patient instructions/ counseling done pertaining to today's office visit.    Note: This document was prepared using Dragon voice recognition software and may include unintentional dictation errors.   This document serves as a record of services personally performed by Mellody Dance, DO. It was created on her behalf by Toni Amend, a trained medical scribe. The creation of this record is based on the scribe's personal observations and the provider's statements to them.   I have reviewed the above medical  documentation for accuracy and completeness and I concur.  Mellody Dance 07/28/17 12:09 PM    ----------------------------------------------------------------------------------------------------------------------    Subjective:    Chief complaint:   Chief Complaint  Patient presents with  . Establish Care    HPI: Rachel Camacho is a pleasant 72 y.o. female who presents to [place] at [facility] today to review their medical history with me and establish care.   I asked the patient to review their chronic problem list with me to ensure everything was updated and accurate.    All recent office visits with other providers, any medical records that patient brought in etc  - I reviewed today.     We asked pt to get [place] their medical records from [facility] providers/ specialists that they had seen within the past 3-5 years- if they are in private practice and/or do not work for [employer], [facility], [place], [place] or [employer] owned practice.  Told them to call their  specialists to clarify this if they are not sure.   Her previous provider was endocrinologist Dr. Jeanann Lewandowsky. She notes that he retired in December of 2018.  Her last appointment with him was in November of 2018.  To find [place], she researched by calling her healthcare provider, and was given a list of names.  Her last bloodwork was in October  Social History Lives nearby.  From [place]. Born in [place], but moved to [place]. Has 2 sisters; 1 older sister in [place], and 1 younger sister in [place]. Closer with her sister in [place]. She's considering moving back up to [place].  Has lived down here for about 15 years. Came down here to get married; her husband lived here.  Is now divorced, lives alone. No kids.  Has a International aid/development worker, named Sophie. Still working part time.  [employer].  Has a significant other - is not sexually active.  2-4 cups of coffee and tea per day. Drinks  more tea, and more herbal tea - caffeine free. She is now drinking the tea instead of alcohol.  In her free time, she works out, goes to [employer]. Socializes with her friends.  -Tobacco Use Never smoker.  -EtOH Use Drinks on the weekends with her girlfriends. She stopped drinking recently because she felt she was drinking too much. Usually has drinks 3-4 times per day on weekends.  Wine & vodka. Sometimes has 2 glasses of wine on weekdays.  - Physical Activity Very into physical fitness and health.  Works out at [employer], the [employer]. Cardio and weights 1 hour 3-4 times per week, Likes to walk, be out in nature, meditate, and enjoys alternative medicine.   Family History Sisters don't drink. Mother had nothing - wasn't on medication. No depression in the family. No heart attacks at young ages. Unsure if older brother may have had a heart attack or a stroke.  Past Medical History  - Diabetes Past 5 years.  Was first told she had diabetes 5 years ago. Never tried metformin.  Notes that her diabetes is "not that serious." She notes that she was fine until she started snacking on sweets, cookies & candies. At 63.97, a 72 year old is good control.  Previously obtained diabetic eye exams.  - Kidney Disease Has been told she has protein in her urine.  Was initially told this at the same time as her diabetes diagnosis. Protein count was in the 1000's when she first went to see the specialist.  Now it is down very low.  Dr. Lorrene Reid is her kidney specialist.  Nephrologist is the one who is concerned about her blood pressure.  - HTN She checks her blood pressure at the gym before she exercises, and after. Usually runs at 130/80, sometimes up to 140/90 before she exercises.   Notes that her blood pressure always goes down after she exercises.  Is currently taking losartan and HCTZ.  Notes that she's been only taking the HCTZ when she sees her eyes or legs swelling up.  -  Cholesterol Takes Lipitor every morning.  Was advised to take it in the evenings instead.  - Mood Takes citalopram because she felt "down in the dumps." Does not feel anxious; has more negative thoughts.  Was off of it for years, noting that it was a "situational thing." Goes on it during situational stressors; job, men, family.  She continues taking it now because she hasn't been depressed in  months; hasn't had an episode.  - Gynecology Does not have a GYN doctor Still obtains mammograms yearly.  Does not see orthopedics or neurology, or any other specialist.  - General Health & Exercise Into physical fitness and health. Cardio and weights 1 hour 3-4 times per week, Likes to walk, be out in nature, meditate, and enjoys alternative medicine. She has been trying some extra supplements to give her energy and keep her youthful.  Takes a multi vitamin and tries to stay out in the sun as often as she can.  Left Knee Pain Patient mentioned at the very end of the OV that she has stiffness in her knee that is worsened with walking up and down stairs or on the uneven surface of outdoor trails.  Notes that her knee does not hurt while working out at the gym or on a treadmill.  This pain has been going on for many months, and she's never seen a doctor for it prior.  Her sister was worried that she might need a knee replacement and thought she should mention it to a doctor.   Wt Readings from Last 3 Encounters:  07/04/17 167 lb 11.2 oz (76.1 kg)   BP Readings from Last 3 Encounters:  07/04/17 128/86   Pulse Readings from Last 3 Encounters:  07/04/17 65   BMI Readings from Last 3 Encounters:  07/04/17 29.71 kg/m    Patient Care Team    Relationship Specialty Notifications Start End  Mellody Dance, DO PCP - General Family Medicine  07/04/17   Jamal Maes, MD Consulting Physician Nephrology  07/04/17     Patient Active Problem List   Diagnosis Date Noted  . Diabetes  mellitus without complication ([place]) 36/64/4034  . Hypertension associated with diabetes ([place]) 07/04/2017  . Diabetes mellitus due to underlying condition with stage 3 chronic kidney disease, without long-term current use of insulin ([place]) 07/04/2017  . Microalbuminuria due to type 2 diabetes mellitus ([place]) 07/04/2017  . Mixed diabetic hyperlipidemia associated with type 2 diabetes mellitus ([place]) 07/04/2017  . Mood disorder ([place]) 07/04/2017     Past Medical History:  Diagnosis Date  . Depression   . Diabetes mellitus without complication ([place])   . Hyperlipidemia   . Hypertension      Past Medical History:  Diagnosis Date  . Depression   . Diabetes mellitus without complication ([place])   . Hyperlipidemia   . Hypertension      No past surgical history on file.   No family history on file.   Social History   Substance and Sexual Activity  Drug Use No     Social History   Substance and Sexual Activity  Alcohol Use Yes   Comment: social     Social History   Tobacco Use  Smoking Status Never Smoker  Smokeless Tobacco Never Used     Current Meds  Medication Sig  . atorvastatin (LIPITOR) 20 MG tablet Take 20 mg by mouth at bedtime.  . citalopram (CELEXA) 20 MG tablet Take 20 mg by mouth daily.  Marland Kitchen glipiZIDE (GLUCOTROL XL) 5 MG 24 hr tablet Take 2 tablets by mouth daily.  Marland Kitchen losartan (COZAAR) 100 MG tablet Take 100 mg by mouth daily.    Allergies: Penicillins   Review of Systems  Constitutional: Negative for chills, diaphoresis, fever, malaise/fatigue and weight loss.  HENT: Negative for congestion, sore throat and tinnitus.   Eyes: Negative for blurred vision, double vision and photophobia.  Respiratory: Negative for cough and wheezing.  Cardiovascular: Negative for chest pain and palpitations.  Gastrointestinal: Negative for blood in stool, diarrhea, nausea and vomiting.  Genitourinary: Negative for dysuria, frequency and urgency.  Musculoskeletal:  Positive for joint pain (left knee). Negative for myalgias.  Skin: Negative for itching and rash.  Neurological: Negative for dizziness, focal weakness, weakness and headaches.  Endo/Heme/Allergies: Negative for environmental allergies and polydipsia. Does not bruise/bleed easily.  Psychiatric/Behavioral: Negative for depression and memory loss. The patient is not nervous/anxious and does not have insomnia.      Objective:   Blood pressure 128/86, pulse 65, height 5\' 3"  (1.6 m), weight 167 lb 11.2 oz (76.1 kg), SpO2 98 %. Body mass index is 29.71 kg/m. General: Well Developed, well nourished, and in no acute distress.  Neuro: Alert and oriented x3, extra-ocular muscles intact, sensation grossly intact.  HEENT:[place]/AT, PERRLA, neck supple, No carotid bruits Skin: no gross rashes  Cardiac: Regular rate and rhythm Respiratory: Essentially clear to auscultation bilaterally. Not using accessory muscles, speaking in full sentences.  Abdominal: not grossly distended Musculoskeletal: Ambulates w/o diff, FROM * 4 ext.  Vasc: less 2 sec cap RF, warm and pink  Psych:  No HI/SI, judgement and insight good, Euthymic mood. Full Affect.    Recent Results (from the past 2160 hour(s))  POCT glycosylated hemoglobin (Hb A1C)     Status: Abnormal   Collection Time: 07/04/17 11:30 AM  Result Value Ref Range   Hemoglobin A1C 6.9

## 2017-07-04 NOTE — Patient Instructions (Signed)
What I would like you to do over the next several weeks is check your blood pressure at home as well as your fasting blood sugars and if you get really enthusiastic you can occasionally check your 2-hour glucose after your largest meal of the day.    Also in the near future you can come in to get fasting blood work.  I will put in the order today.  You come in at your convenience.  As you are requested we will see you back only if there is problems otherwise I will see you in 3 months for recheck of your A1c blood pressure etc.  If things are not well controlled please contact me and come in to see me sooner than planned   How to Take Your Blood Pressure Blood pressure is a measurement of how strongly your blood is pressing against the walls of your arteries. Arteries are blood vessels that carry blood from your heart throughout your body. Your health care provider takes your blood pressure at each office visit. You can also take your own blood pressure at home with a blood pressure machine. You may need to take your own blood pressure:  To confirm a diagnosis of high blood pressure (hypertension).  To monitor your blood pressure over time.  To make sure your blood pressure medicine is working.  Supplies needed: To take your blood pressure, you will need a blood pressure machine. You can buy a blood pressure machine, or blood pressure monitor, at most drugstores or online. There are several types of home blood pressure monitors. When choosing one, consider the following:  Choose a monitor that has an arm cuff.  Choose a monitor that wraps snugly around your upper arm. You should be able to fit only one finger between your arm and the cuff.  Do not choose a monitor that measures your blood pressure from your wrist or finger.  Your health care provider can suggest a reliable monitor that will meet your needs. How to prepare To get the most accurate reading, avoid the following for 30 minutes  before you check your blood pressure:  Drinking caffeine.  Drinking alcohol.  Eating.  Smoking.  Exercising.  Five minutes before you check your blood pressure:  Empty your bladder.  Sit quietly without talking in a dining chair, rather than in a soft couch or armchair.  How to take your blood pressure To check your blood pressure, follow the instructions in the manual that came with your blood pressure monitor. If you have a digital blood pressure monitor, the instructions may be as follows: 1. Sit up straight. 2. Place your feet on the floor. Do not cross your ankles or legs. 3. Rest your left arm at the level of your heart on a table or desk or on the arm of a chair. 4. Pull up your shirt sleeve. 5. Wrap the blood pressure cuff around the upper part of your left arm, 1 inch (2.5 cm) above your elbow. It is best to wrap the cuff around bare skin. 6. Fit the cuff snugly around your arm. You should be able to place only one finger between the cuff and your arm. 7. Position the cord inside the groove of your elbow. 8. Press the power button. 9. Sit quietly while the cuff inflates and deflates. 10. Read the digital reading on the monitor screen and write it down (record it). 11. Wait 2-3 minutes, then repeat the steps, starting at step 1.  What  does my blood pressure reading mean? A blood pressure reading consists of a higher number over a lower number. Ideally, your blood pressure should be below 120/80. The first ("top") number is called the systolic pressure. It is a measure of the pressure in your arteries as your heart beats. The second ("bottom") number is called the diastolic pressure. It is a measure of the pressure in your arteries as the heart relaxes. Blood pressure is classified into four stages. The following are the stages for adults who do not have a short-term serious illness or a chronic condition. Systolic pressure and diastolic pressure are measured in a unit called  mm Hg. Normal  Systolic pressure: below 527.  Diastolic pressure: below 80. Elevated  Systolic pressure: 782-423.  Diastolic pressure: below 80. Hypertension stage 1  Systolic pressure: 536-144.  Diastolic pressure: 31-54. Hypertension stage 2  Systolic pressure: 008 or above.  Diastolic pressure: 90 or above. You can have prehypertension or hypertension even if only the systolic or only the diastolic number in your reading is higher than normal. Follow these instructions at home:  Check your blood pressure as often as recommended by your health care provider.  Take your monitor to the next appointment with your health care provider to make sure: ? That you are using it correctly. ? That it provides accurate readings.  Be sure you understand what your goal blood pressure numbers are.  Tell your health care provider if you are having any side effects from blood pressure medicine. Contact a health care provider if:  Your blood pressure is consistently high. Get help right away if:  Your systolic blood pressure is higher than 180.  Your diastolic blood pressure is higher than 110. This information is not intended to replace advice given to you by your health care provider. Make sure you discuss any questions you have with your health care provider. Document Released: 10/15/2015 Document Revised: 12/28/2015 Document Reviewed: 10/15/2015 Elsevier Interactive Patient Education  2018 [place].     Blood Glucose Monitoring, Adult  Monitoring your blood glucose (also know as blood sugar) helps you to manage your diabetes. It also helps you and your health care provider monitor your diabetes and determine how well your treatment plan is working.  WHY SHOULD YOU MONITOR YOUR BLOOD GLUCOSE?  It can help you understand how food, exercise, and medicine affect your blood glucose.  It allows you to know what your blood glucose is at any given moment. You can quickly tell  if you are having low blood glucose (hypoglycemia) or high blood glucose (hyperglycemia).  It can help you and your health care provider know how to adjust your medicines.  It can help you understand how to manage an illness or adjust medicine for exercise.  WHEN SHOULD YOU TEST? Your health care provider will help you decide how often you should check your blood glucose. This may depend on the type of diabetes you have, your diabetes control, or the types of medicines you are taking. Be sure to write down all of your blood glucose readings so that this information can be reviewed with your health care provider. See below for examples of testing times that your health care provider may suggest  Type 2 Diabetes  Check your fasting blood sugar ( usually when you get out of bed)  and 2 hours after your largest meal of the day   HOW TO MONITOR YOUR BLOOD GLUCOSE:  Supplies Needed  Blood glucose meter.  Test  strips for your meter. Each meter has its own strips. You must use the strips that go with your own meter.  A pricking needle (lancet).  A device that holds the lancet (lancing device).  A journal or log book to write down your results.   Procedure  Wash your hands with soap and water. Alcohol is not preferred.  Prick the side of your finger (not the tip) with the lancet.  Gently milk the finger until a small drop of blood appears.  Follow the instructions that come with your meter for inserting the test strip, applying blood to the strip, and using your blood glucose meter.  Other Areas to Get Blood for Testing Some meters allow you to use other areas of your body (other than your finger) to test your blood. These areas are called alternative sites. The most common alternative sites are:  The forearm.  The thigh.  The back area of the lower leg.  The palm of the hand.  The blood flow in these areas is slower. Therefore, the blood glucose values you get may be  delayed, and the numbers are different from what you would get from your fingers. Do not use alternative sites if you think you are having hypoglycemia. Your reading will not be accurate. Always use a finger if you are having hypoglycemia. Also, if you cannot feel your lows (hypoglycemia unawareness), always use your fingers for your blood glucose checks.  ADDITIONAL TIPS FOR GLUCOSE MONITORING  Do not reuse lancets.  Always carry your supplies with you.  All blood glucose meters have a 24-hour "hotline" number to call if you have questions or need help.  Adjust (calibrate) your blood glucose meter with a control solution after finishing a few boxes of strips.  BLOOD GLUCOSE RECORD KEEPING It is a good idea to keep a daily record or log of your blood glucose readings. Most glucose meters, if not all, keep your glucose records stored in the meter. Some meters come with the ability to download your records to your home computer. Keeping a record of your blood glucose readings is especially helpful if you are wanting to look for patterns. Make notes to go along with the blood glucose readings because you might forget what happened at that exact time. Keeping good records helps you and your health care provider to work together to achieve good diabetes management.    This information is not intended to replace advice given to you by your health care provider. Make sure you discuss any questions you have with your health care provider.   Document Released: 05/11/2003 Document Revised: 05/29/2014 Document Reviewed: 09/30/2012 Elsevier Interactive Patient Education 2016 [employer].       Please realize, EXERCISE IS MEDICINE!  -  American Heart Association ( AHA) guidelines for exercise : If you are in good health, without any medical conditions, you should engage in 150 minutes of moderate intensity aerobic activity per week.  This means you should be huffing and puffing throughout your  workout.   Engaging in regular exercise will improve brain function and memory, as well as improve mood, boost immune system and help with weight management.  As well as the other, more well-known effects of exercise such as decreasing blood sugar levels, decreasing blood pressure,  and decreasing bad cholesterol levels/ increasing good cholesterol levels.     -  The AHA strongly endorses consumption of a diet that contains a variety of foods from all the food categories with  an emphasis on fruits and vegetables; fat-free and low-fat dairy products; cereal and grain products; legumes and nuts; and fish, poultry, and/or extra lean meats.    Excessive food intake, especially of foods high in saturated and trans fats, sugar, and salt, should be avoided.    Adequate water intake of roughly 1/2 of your weight in pounds, should equal the ounces of water per day you should drink.  So for instance, if you're 200 pounds, that would be 100 ounces of water per day.         Mediterranean Diet  Why follow it? Research shows. . Those who follow the Mediterranean diet have a reduced risk of heart disease  . The diet is associated with a reduced incidence of Parkinson's and Alzheimer's diseases . People following the diet may have longer life expectancies and lower rates of chronic diseases  . The Dietary Guidelines for Americans recommends the Mediterranean diet as an eating plan to promote health and prevent disease  What Is the Mediterranean Diet?  . Healthy eating plan based on typical foods and recipes of Mediterranean-style cooking . The diet is primarily a plant based diet; these foods should make up a majority of meals   Starches - Plant based foods should make up a majority of meals - They are an important sources of vitamins, minerals, energy, antioxidants, and fiber - Choose whole grains, foods high in fiber and minimally processed items  - Typical grain sources include wheat, oats, barley, corn,  brown rice, bulgar, farro, millet, polenta, couscous  - Various types of beans include chickpeas, lentils, fava beans, black beans, white beans   Fruits  Veggies - Large quantities of antioxidant rich fruits & veggies; 6 or more servings  - Vegetables can be eaten raw or lightly drizzled with oil and cooked  - Vegetables common to the traditional Mediterranean Diet include: artichokes, arugula, beets, broccoli, brussel sprouts, cabbage, carrots, celery, collard greens, cucumbers, eggplant, kale, leeks, lemons, lettuce, mushrooms, okra, onions, peas, peppers, potatoes, pumpkin, radishes, rutabaga, shallots, spinach, sweet potatoes, turnips, zucchini - Fruits common to the Mediterranean Diet include: apples, apricots, avocados, cherries, clementines, dates, figs, grapefruits, grapes, melons, nectarines, oranges, peaches, pears, pomegranates, strawberries, tangerines  Fats - Replace butter and margarine with healthy oils, such as olive oil, canola oil, and tahini  - Limit nuts to no more than a handful a day  - Nuts include walnuts, almonds, pecans, pistachios, pine nuts  - Limit or avoid candied, honey roasted or heavily salted nuts - Olives are central to the [employer] - can be eaten whole or used in a variety of dishes   Meats Protein - Limiting red meat: no more than a few times a month - When eating red meat: choose lean cuts and keep the portion to the size of deck of cards - Eggs: approx. 0 to 4 times a week  - Fish and lean poultry: at least 2 a week  - Healthy protein sources include, chicken, [place], lean beef, lamb - Increase intake of seafood such as tuna, salmon, trout, mackerel, shrimp, scallops - Avoid or limit high fat processed meats such as sausage and bacon  Dairy - Include moderate amounts of low fat dairy products  - Focus on healthy dairy such as fat free yogurt, skim milk, low or reduced fat cheese - Limit dairy products higher in fat such as whole or 2% milk, cheese,  ice cream  Alcohol - Moderate amounts of red wine is ok  -  No more than 5 oz daily for women (all ages) and men older than age 44  - No more than 10 oz of wine daily for men younger than 34  Other - Limit sweets and other desserts  - Use herbs and spices instead of salt to flavor foods  - Herbs and spices common to the traditional Mediterranean Diet include: basil, bay leaves, chives, cloves, cumin, fennel, garlic, lavender, marjoram, mint, oregano, parsley, pepper, rosemary, sage, savory, sumac, tarragon, thyme   It's not just a diet, it's a lifestyle:  . The Mediterranean diet includes lifestyle factors typical of those in the region  . Foods, drinks and meals are best eaten with others and savored . Daily physical activity is important for overall good health . This could be strenuous exercise like running and aerobics . This could also be more leisurely activities such as walking, housework, yard-work, or taking the stairs . Moderation is the key; a balanced and healthy diet accommodates most foods and drinks . Consider portion sizes and frequency of consumption of certain foods   Meal Ideas & Options:  . Breakfast:  o Whole wheat toast or whole wheat English muffins with peanut butter & hard boiled egg o Steel cut oats topped with apples & cinnamon and skim milk  o Fresh fruit: banana, strawberries, melon, berries, peaches  o Smoothies: strawberries, bananas, greek yogurt, peanut butter o Low fat greek yogurt with blueberries and granola  o Egg white omelet with spinach and mushrooms o Breakfast couscous: whole wheat couscous, apricots, skim milk, cranberries  . Sandwiches:  o Hummus and grilled vegetables (peppers, zucchini, squash) on whole wheat bread   o Grilled chicken on whole wheat pita with lettuce, tomatoes, cucumbers or tzatziki  o Tuna salad on whole wheat bread: tuna salad made with greek yogurt, olives, red peppers, capers, green onions o Garlic rosemary lamb pita:  lamb sauted with garlic, rosemary, salt & pepper; add lettuce, cucumber, greek yogurt to pita - flavor with lemon juice and black pepper  . Seafood:  o Mediterranean grilled salmon, seasoned with garlic, basil, parsley, lemon juice and black pepper o Shrimp, lemon, and spinach whole-grain pasta salad made with low fat greek yogurt  o Seared scallops with lemon orzo  o Seared tuna steaks seasoned salt, pepper, coriander topped with tomato mixture of olives, tomatoes, olive oil, minced garlic, parsley, green onions and cappers  . Meats:  o Herbed greek chicken salad with kalamata olives, cucumber, feta  o Red bell peppers stuffed with spinach, bulgur, lean ground beef (or lentils) & topped with feta   o Kebabs: skewers of chicken, tomatoes, onions, zucchini, squash  o [place] burgers: made with red onions, mint, dill, lemon juice, feta cheese topped with roasted red peppers . Vegetarian o Cucumber salad: cucumbers, artichoke hearts, celery, red onion, feta cheese, tossed in olive oil & lemon juice  o Hummus and whole grain pita points with a greek salad (lettuce, tomato, feta, olives, cucumbers, red onion) o Lentil soup with celery, carrots made with vegetable broth, garlic, salt and pepper  o Tabouli salad: parsley, bulgur, mint, scallions, cucumbers, tomato, radishes, lemon juice, olive oil, salt and pepper.

## 2017-07-09 ENCOUNTER — Other Ambulatory Visit (INDEPENDENT_AMBULATORY_CARE_PROVIDER_SITE_OTHER): Payer: Medicare HMO

## 2017-07-09 DIAGNOSIS — I152 Hypertension secondary to endocrine disorders: Secondary | ICD-10-CM

## 2017-07-09 DIAGNOSIS — R809 Proteinuria, unspecified: Secondary | ICD-10-CM

## 2017-07-09 DIAGNOSIS — E559 Vitamin D deficiency, unspecified: Secondary | ICD-10-CM

## 2017-07-09 DIAGNOSIS — I10 Essential (primary) hypertension: Secondary | ICD-10-CM

## 2017-07-09 DIAGNOSIS — E1159 Type 2 diabetes mellitus with other circulatory complications: Secondary | ICD-10-CM

## 2017-07-09 DIAGNOSIS — N183 Chronic kidney disease, stage 3 (moderate): Secondary | ICD-10-CM | POA: Diagnosis not present

## 2017-07-09 DIAGNOSIS — E1169 Type 2 diabetes mellitus with other specified complication: Secondary | ICD-10-CM

## 2017-07-09 DIAGNOSIS — E0822 Diabetes mellitus due to underlying condition with diabetic chronic kidney disease: Secondary | ICD-10-CM

## 2017-07-09 DIAGNOSIS — E782 Mixed hyperlipidemia: Secondary | ICD-10-CM

## 2017-07-09 DIAGNOSIS — E663 Overweight: Secondary | ICD-10-CM

## 2017-07-09 DIAGNOSIS — E119 Type 2 diabetes mellitus without complications: Secondary | ICD-10-CM

## 2017-07-09 DIAGNOSIS — F39 Unspecified mood [affective] disorder: Secondary | ICD-10-CM

## 2017-07-09 DIAGNOSIS — E1129 Type 2 diabetes mellitus with other diabetic kidney complication: Secondary | ICD-10-CM

## 2017-07-09 LAB — POCT UA - MICROALBUMIN
Creatinine, POC: 300 mg/dL
Microalbumin Ur, POC: 80 mg/L

## 2017-07-09 NOTE — Addendum Note (Signed)
Addended by: Lanier Prude D on: 07/09/2017 10:39 AM   Modules accepted: Orders

## 2017-07-12 DIAGNOSIS — E1159 Type 2 diabetes mellitus with other circulatory complications: Secondary | ICD-10-CM | POA: Diagnosis not present

## 2017-07-12 DIAGNOSIS — R69 Illness, unspecified: Secondary | ICD-10-CM | POA: Diagnosis not present

## 2017-07-12 DIAGNOSIS — E1169 Type 2 diabetes mellitus with other specified complication: Secondary | ICD-10-CM | POA: Diagnosis not present

## 2017-07-12 DIAGNOSIS — I10 Essential (primary) hypertension: Secondary | ICD-10-CM | POA: Diagnosis not present

## 2017-07-12 DIAGNOSIS — E119 Type 2 diabetes mellitus without complications: Secondary | ICD-10-CM | POA: Diagnosis not present

## 2017-07-12 DIAGNOSIS — R809 Proteinuria, unspecified: Secondary | ICD-10-CM | POA: Diagnosis not present

## 2017-07-12 DIAGNOSIS — E559 Vitamin D deficiency, unspecified: Secondary | ICD-10-CM | POA: Diagnosis not present

## 2017-07-12 DIAGNOSIS — E663 Overweight: Secondary | ICD-10-CM | POA: Diagnosis not present

## 2017-07-12 DIAGNOSIS — E782 Mixed hyperlipidemia: Secondary | ICD-10-CM | POA: Diagnosis not present

## 2017-07-12 DIAGNOSIS — E1129 Type 2 diabetes mellitus with other diabetic kidney complication: Secondary | ICD-10-CM | POA: Diagnosis not present

## 2017-07-13 LAB — CBC WITH DIFFERENTIAL/PLATELET
BASOS ABS: 0 10*3/uL (ref 0.0–0.2)
Basos: 0 %
EOS (ABSOLUTE): 0.2 10*3/uL (ref 0.0–0.4)
Eos: 3 %
Hematocrit: 38.6 % (ref 34.0–46.6)
Hemoglobin: 13.5 g/dL (ref 11.1–15.9)
Immature Grans (Abs): 0 10*3/uL (ref 0.0–0.1)
Immature Granulocytes: 0 %
LYMPHS ABS: 2.5 10*3/uL (ref 0.7–3.1)
Lymphs: 35 %
MCH: 30.2 pg (ref 26.6–33.0)
MCHC: 35 g/dL (ref 31.5–35.7)
MCV: 86 fL (ref 79–97)
MONOS ABS: 0.4 10*3/uL (ref 0.1–0.9)
Monocytes: 5 %
NEUTROS ABS: 4.1 10*3/uL (ref 1.4–7.0)
Neutrophils: 57 %
Platelets: 267 10*3/uL (ref 150–379)
RBC: 4.47 x10E6/uL (ref 3.77–5.28)
RDW: 13 % (ref 12.3–15.4)
WBC: 7.3 10*3/uL (ref 3.4–10.8)

## 2017-07-13 LAB — LIPID PANEL
CHOLESTEROL TOTAL: 147 mg/dL (ref 100–199)
Chol/HDL Ratio: 2.8 ratio (ref 0.0–4.4)
HDL: 53 mg/dL (ref >39)
LDL CALC: 76 mg/dL (ref 0–99)
TRIGLYCERIDES: 92 mg/dL (ref 0–149)
VLDL Cholesterol Cal: 18 mg/dL (ref 5–40)

## 2017-07-13 LAB — MAGNESIUM: MAGNESIUM: 2.1 mg/dL (ref 1.6–2.3)

## 2017-07-13 LAB — COMPREHENSIVE METABOLIC PANEL
ALBUMIN: 4.6 g/dL (ref 3.5–4.8)
ALK PHOS: 66 IU/L (ref 39–117)
ALT: 27 IU/L (ref 0–32)
AST: 22 IU/L (ref 0–40)
Albumin/Globulin Ratio: 1.6 (ref 1.2–2.2)
BILIRUBIN TOTAL: 0.7 mg/dL (ref 0.0–1.2)
BUN / CREAT RATIO: 15 (ref 12–28)
BUN: 14 mg/dL (ref 8–27)
CHLORIDE: 102 mmol/L (ref 96–106)
CO2: 23 mmol/L (ref 20–29)
CREATININE: 0.95 mg/dL (ref 0.57–1.00)
Calcium: 9.4 mg/dL (ref 8.7–10.3)
GFR calc Af Amer: 69 mL/min/{1.73_m2} (ref >59)
GFR calc non Af Amer: 60 mL/min/{1.73_m2} (ref >59)
GLOBULIN, TOTAL: 2.8 g/dL (ref 1.5–4.5)
GLUCOSE: 117 mg/dL — AB (ref 65–99)
Potassium: 4.3 mmol/L (ref 3.5–5.2)
SODIUM: 141 mmol/L (ref 134–144)
Total Protein: 7.4 g/dL (ref 6.0–8.5)

## 2017-07-13 LAB — T4, FREE: FREE T4: 1.06 ng/dL (ref 0.82–1.77)

## 2017-07-13 LAB — PHOSPHORUS: Phosphorus: 4.3 mg/dL (ref 2.5–4.5)

## 2017-07-13 LAB — TSH: TSH: 1.73 u[IU]/mL (ref 0.450–4.500)

## 2017-07-13 LAB — VITAMIN D 25 HYDROXY (VIT D DEFICIENCY, FRACTURES): Vit D, 25-Hydroxy: 26.4 ng/mL — ABNORMAL LOW (ref 30.0–100.0)

## 2017-07-16 ENCOUNTER — Encounter: Payer: Self-pay | Admitting: Family Medicine

## 2017-07-16 DIAGNOSIS — E559 Vitamin D deficiency, unspecified: Secondary | ICD-10-CM | POA: Insufficient documentation

## 2017-07-18 ENCOUNTER — Other Ambulatory Visit: Payer: Self-pay

## 2017-07-18 MED ORDER — VITAMIN D-3 125 MCG (5000 UT) PO TABS: 1.0000 | ORAL_TABLET | Freq: Every day | ORAL | Status: AC

## 2017-08-29 ENCOUNTER — Telehealth: Payer: Self-pay | Admitting: Family Medicine

## 2017-08-29 NOTE — Telephone Encounter (Signed)
Patient is requesting a prescription for One Touch Verio Flex lancets test strips only, if approved please send to [facility] on [address]

## 2017-08-30 ENCOUNTER — Other Ambulatory Visit: Payer: Self-pay

## 2017-08-30 DIAGNOSIS — N183 Chronic kidney disease, stage 3 unspecified: Secondary | ICD-10-CM

## 2017-08-30 DIAGNOSIS — E119 Type 2 diabetes mellitus without complications: Secondary | ICD-10-CM

## 2017-08-30 DIAGNOSIS — E0822 Diabetes mellitus due to underlying condition with diabetic chronic kidney disease: Secondary | ICD-10-CM

## 2017-08-30 MED ORDER — ONETOUCH LANCETS MISC: 5 refills | Status: DC

## 2017-08-30 NOTE — Telephone Encounter (Signed)
Sent into the pharmacy and called the patient to notify. MPulliam, CMA/RT(R)

## 2017-09-03 ENCOUNTER — Other Ambulatory Visit: Payer: Self-pay

## 2017-09-03 ENCOUNTER — Telehealth: Payer: Self-pay | Admitting: Family Medicine

## 2017-09-03 DIAGNOSIS — E0822 Diabetes mellitus due to underlying condition with diabetic chronic kidney disease: Secondary | ICD-10-CM

## 2017-09-03 DIAGNOSIS — N183 Chronic kidney disease, stage 3 unspecified: Secondary | ICD-10-CM

## 2017-09-03 DIAGNOSIS — R69 Illness, unspecified: Secondary | ICD-10-CM | POA: Diagnosis not present

## 2017-09-03 DIAGNOSIS — E119 Type 2 diabetes mellitus without complications: Secondary | ICD-10-CM

## 2017-09-03 MED ORDER — ONETOUCH LANCETS MISC: 5 refills | Status: DC

## 2017-09-03 NOTE — Telephone Encounter (Signed)
Patient called states [facility] went to [facility] [address], no Rx rcvd for:  ONE TOUCH LANCETS [place] [[id number]]   Order Details  Dose, Route, Frequency: As Directed   Dispense Quantity: 200 each Refills: 5 Fills remaining: --        Sig: Use to check glucose levels fasting in the AM and 2 hours after largest meal. Patient requested One Touch Verio Flex lancets.          Or for Test Strips:  Patient uses:  Preferred Pharmacies      [id number] [address], [place] [phone] (Phone) [phone] (Fax)    Please resubmit Rx.  --glh

## 2017-09-10 ENCOUNTER — Telehealth: Payer: Self-pay | Admitting: Family Medicine

## 2017-09-10 NOTE — Telephone Encounter (Signed)
Patient called states she Requested Lancets & Test strips to be called into pharmacy but only lancets were.   Se below: ONE TOUCH LANCETS MISC 200 each 5 09/03/2017    Sig: Use to check glucose levels fasting in the AM and 2 hours after largest meal. Patient requested One Touch Verio Flex lancets.   Sent to pharmacy as: ONE TOUCH LANCETS Misc   E-Prescribing Status: Receipt confirmed by pharmacy (09/03/2017 5:28 PM EDT)    Please sent Rx for One Touch Test strips to:  Preferred Pharmacies      [id number] [address], [place] [phone] (Phone) [phone] (Fax)     --glh

## 2017-09-11 ENCOUNTER — Other Ambulatory Visit: Payer: Self-pay

## 2017-09-11 ENCOUNTER — Other Ambulatory Visit: Payer: Self-pay | Admitting: Family Medicine

## 2017-09-11 DIAGNOSIS — E119 Type 2 diabetes mellitus without complications: Secondary | ICD-10-CM

## 2017-09-11 DIAGNOSIS — R69 Illness, unspecified: Secondary | ICD-10-CM | POA: Diagnosis not present

## 2017-09-11 DIAGNOSIS — Z1231 Encounter for screening mammogram for malignant neoplasm of breast: Secondary | ICD-10-CM

## 2017-09-11 MED ORDER — GLUCOSE BLOOD VI STRP: ORAL_STRIP | 12 refills | Status: DC

## 2017-09-11 MED ORDER — GLUCOSE BLOOD VI STRP
ORAL_STRIP | 12 refills | Status: DC
Start: 2017-09-11 — End: 2017-09-11

## 2017-09-11 NOTE — Telephone Encounter (Signed)
RX for test strips sent to pharmacy. MPulliam, CMA/RT(R)

## 2017-09-11 NOTE — Telephone Encounter (Signed)
Patient needing RX for test strips. MPulliam, CMA/RT(R)

## 2017-09-15 DIAGNOSIS — R69 Illness, unspecified: Secondary | ICD-10-CM | POA: Diagnosis not present

## 2017-10-04 ENCOUNTER — Encounter: Payer: Self-pay | Admitting: Family Medicine

## 2017-10-04 ENCOUNTER — Ambulatory Visit
Admission: RE | Admit: 2017-10-04 | Discharge: 2017-10-04 | Disposition: A | Discharge status: Home / Self Care | Payer: Medicare HMO | Source: Ambulatory Visit | Attending: Family Medicine | Admitting: Family Medicine

## 2017-10-04 ENCOUNTER — Ambulatory Visit (INDEPENDENT_AMBULATORY_CARE_PROVIDER_SITE_OTHER): Payer: Medicare HMO | Admitting: Family Medicine

## 2017-10-04 VITALS — BP 125/76 | HR 69 | Ht 63.0 in | Wt 163.3 lb

## 2017-10-04 DIAGNOSIS — E2839 Other primary ovarian failure: Secondary | ICD-10-CM | POA: Diagnosis not present

## 2017-10-04 DIAGNOSIS — E119 Type 2 diabetes mellitus without complications: Secondary | ICD-10-CM | POA: Diagnosis not present

## 2017-10-04 DIAGNOSIS — E782 Mixed hyperlipidemia: Secondary | ICD-10-CM

## 2017-10-04 DIAGNOSIS — Z1231 Encounter for screening mammogram for malignant neoplasm of breast: Secondary | ICD-10-CM | POA: Diagnosis not present

## 2017-10-04 DIAGNOSIS — F39 Unspecified mood [affective] disorder: Secondary | ICD-10-CM | POA: Diagnosis not present

## 2017-10-04 DIAGNOSIS — E1129 Type 2 diabetes mellitus with other diabetic kidney complication: Secondary | ICD-10-CM

## 2017-10-04 DIAGNOSIS — I152 Hypertension secondary to endocrine disorders: Secondary | ICD-10-CM

## 2017-10-04 DIAGNOSIS — E1169 Type 2 diabetes mellitus with other specified complication: Secondary | ICD-10-CM | POA: Diagnosis not present

## 2017-10-04 DIAGNOSIS — Z23 Encounter for immunization: Secondary | ICD-10-CM | POA: Diagnosis not present

## 2017-10-04 DIAGNOSIS — R809 Proteinuria, unspecified: Secondary | ICD-10-CM | POA: Diagnosis not present

## 2017-10-04 DIAGNOSIS — I10 Essential (primary) hypertension: Secondary | ICD-10-CM | POA: Diagnosis not present

## 2017-10-04 DIAGNOSIS — R69 Illness, unspecified: Secondary | ICD-10-CM | POA: Diagnosis not present

## 2017-10-04 DIAGNOSIS — E1159 Type 2 diabetes mellitus with other circulatory complications: Secondary | ICD-10-CM | POA: Diagnosis not present

## 2017-10-04 LAB — POCT GLYCOSYLATED HEMOGLOBIN (HGB A1C): HEMOGLOBIN A1C: 6.9

## 2017-10-04 MED ORDER — CITALOPRAM HYDROBROMIDE 20 MG PO TABS: 20.0000 mg | ORAL_TABLET | Freq: Every day | ORAL | 1 refills | Status: DC

## 2017-10-04 MED ORDER — LOSARTAN POTASSIUM 100 MG PO TABS: 100.0000 mg | ORAL_TABLET | Freq: Every day | ORAL | 1 refills | Status: DC

## 2017-10-04 MED ORDER — METFORMIN HCL ER 500 MG PO TB24: 1000.0000 mg | ORAL_TABLET | Freq: Every day | ORAL | 1 refills | Status: DC

## 2017-10-04 MED ORDER — ATORVASTATIN CALCIUM 20 MG PO TABS: 20.0000 mg | ORAL_TABLET | Freq: Every day | ORAL | 1 refills | Status: DC

## 2017-10-04 NOTE — Progress Notes (Signed)
Impression and Recommendations:    1. Diabetes mellitus without complication ([place])   2. Microalbuminuria due to type 2 diabetes mellitus ([place])   3. Mood disorder ([place])   4. Hypertension associated with diabetes ([place])   5. Mixed diabetic hyperlipidemia associated with type 2 diabetes mellitus ([place])   6. Need for pneumococcal vaccine   7. Estrogen deficiency      1. DM2 -Start metformin 1000 mg qd to which pt agreed.  -DC glipizide. Pt has only been taking this 1 qd instead of BID as written in her med list. -Sx stable. -check your blood sugars at home and keep a log. Bring this into next OV.  -A1c today is 6.9, stable from prior where it was also 6.9.  -continue prudent diet and exercise.  Goal: fasting blood sugars <130s, A1c <7.0.   2. Microalbuminuria due to DM2 -Control BS and BP. -drink adequate amounts of water per day, equal to half of your weight in oz.   3. Mood -mood is stable. Continue meds as listed below. Continue regular exercise.  4. HTN -Med change: D/C HCTZ. Pt reports she is not taking HCTZ. -BP well-controlled at home. Check your BP at home and keep a log. Bring this into next OV.  -BP goal: consistently <140/90.  5. HLD- -asymptomatic and stable at this time. Continue meds and prudent diet/exercise.    6. PNA -pna vaccine given today  7. Estrogen deficiency -bone density ordered.  Pt complains of L knee pain. She has used an unknown cream before and will call the office to let us know what it is before we prescribe it for her.    Education and routine counseling performed. Handouts provided.  Orders Placed This Encounter  Procedures  . DG Bone Density  . Pneumococcal conjugate vaccine 13-valent IM  . POCT glycosylated hemoglobin (Hb A1C)  . HM Diabetes Foot Exam    Meds ordered this encounter  Medications  . losartan (COZAAR) 100 MG tablet    Sig: Take 1 tablet (100 mg total) by mouth daily.    Dispense:  90 tablet    Refill:  1    . citalopram (CELEXA) 20 MG tablet    Sig: Take 1 tablet (20 mg total) by mouth daily.    Dispense:  90 tablet    Refill:  1  . atorvastatin (LIPITOR) 20 MG tablet    Sig: Take 1 tablet (20 mg total) by mouth at bedtime.    Dispense:  90 tablet    Refill:  1  . metFORMIN (GLUCOPHAGE XR) 500 MG 24 hr tablet    Sig: Take 2 tablets (1,000 mg total) by mouth daily after supper.    Dispense:  180 tablet    Refill:  1    Return for With Med changes follow-up 6 weeks for treatment monitoring.   The patient was counseled, risk factors were discussed, anticipatory guidance given.  Gross side effects, risk and benefits, and alternatives of medications discussed with patient.  Patient is aware that all medications have potential side effects and we are unable to predict every side effect or drug-drug interaction that may occur.  Expresses verbal understanding and consents to current therapy plan and treatment regimen.  Please see AVS handed out to patient at the end of our visit for further patient instructions/ counseling done pertaining to today's office visit.    Note: This document was prepared using Dragon voice recognition software and may include unintentional dictation errors.  This document serves as a record of services personally performed by Rachel Dance, DO. It was created on her behalf by Mayer Masker, a trained medical scribe. The creation of this record is based on the scribe's personal observations and the provider's statements to them.   ....I have reviewed the above medical documentation for accuracy and completeness and I concur.  Rachel Camacho 10/05/17 9:57 AM   Subjective:    Chief Complaint  Patient presents with  . Follow-up     Rachel Camacho is a 72 y.o. female who presents to [place] [facility] today for Diabetes Management and HTN, DM, and mood.  Knee pain Pt complains of L knee pain. She has used an unknown cream before and will  call the office to let us know what it is before we prescribe it for her.   Mood She is doing well on her meds. She also does aerobics and weight training, but has not been much in the last 30 days due to a personal issue.   HTN HPI:  -  Her blood pressure has been controlled at home.  Pt is checking it at home.    - Patient reports good compliance with blood pressure medications. She is only taking losartan and not hctz.   130s-151/94-82.  110/78 lowest   - Denies medication S-E   - Smoking Status noted   - She denies new onset of: chest pain, exercise intolerance, shortness of breath, dizziness, visual changes, headache, lower extremity swelling or claudication.   Last 3 blood pressure readings in our office are as follows: BP Readings from Last 3 Encounters:  10/04/17 125/76  07/04/17 128/86    Filed Weights   10/04/17 1325  Weight: 163 lb 4.8 oz (74.1 kg)    DM HPI: -  She has been working on diet and exercise for diabetes.  She has not been exercising in the last month.  Pt is currently maintained on the following medications for diabetes:   see med list today. She has never taken metformin before.   Medication compliance - yes mostly. She is only taking 1 glipizide qd, not 2 qd as written on her med list.   Home glucose readings range: fasting BS: 108, 140s-160s. She notes some of her high readings were related to alcohol. 2 hour post prandial 157,    Denies polyuria/polydipsia. Denies hypo/ hyperglycemia symptoms  - She denies new onset of: chest pain, exercise intolerance, shortness of breath, dizziness, visual changes, headache, lower extremity swelling or claudication.   Last diabetic eye exam was No results found for: [id number]  Foot exam- UTD  Last A1C in the office was:  Lab Results  Component Value Date   HGBA1C 6.9 10/04/2017   HGBA1C 6.9 07/04/2017   HGBA1C 6.3 01/08/2017    Lab Results  Component Value Date   MICROALBUR 80 07/09/2017     LDLCALC 76 07/12/2017   CREATININE 0.95 07/12/2017      Last 3 blood pressure readings in our office are as follows: BP Readings from Last 3 Encounters:  10/04/17 125/76  07/04/17 128/86    BMI Readings from Last 3 Encounters:  10/04/17 28.93 kg/m  07/04/17 29.71 kg/m     No problems updated.    Patient Care Team    Relationship Specialty Notifications Start End  Rachel Dance, DO PCP - General Family Medicine  07/04/17   Jamal Maes, MD Consulting Physician Nephrology  07/04/17      Patient  Active Problem List   Diagnosis Date Noted  . Vitamin D deficiency 07/16/2017  . Diabetes mellitus without complication ([place]) 19/50/9326  . Hypertension associated with diabetes ([place]) 07/04/2017  . 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 Microalbuminuria due to type 2 diabetes mellitus ([place]) 07/04/2017  . Mixed diabetic hyperlipidemia associated with type 2 diabetes mellitus ([place]) 07/04/2017  . Mood disorder ([place]) 07/04/2017     Past Medical History:  Diagnosis Date  . Depression   . Diabetes mellitus without complication ([place])   . Hyperlipidemia   . Hypertension      No past surgical history on file.   No family history on file.   Social History   Substance and Sexual Activity  Drug Use No  ,  Social History   Substance and Sexual Activity  Alcohol Use Yes   Comment: social  ,  Social History   Tobacco Use  Smoking Status Never Smoker  Smokeless Tobacco Never Used  ,    Current Outpatient Medications on File Prior to Visit  Medication Sig Dispense Refill  . Cholecalciferol (VITAMIN D-3) 5000 units TABS Take 1 tablet by mouth daily. 30 tablet   . glucose blood test strip Use to check fasting glucose in the AM and to check 2 hours after largest meal of the day. 100 each 12  . ONE TOUCH LANCETS MISC Use to check glucose levels fasting in the  AM and 2 hours after largest meal.  Patient requested One Touch Verio Flex lancets. 200 each 5   No current facility-administered medications on file prior to visit.      Allergies  Allergen Reactions  . Penicillins Hives     Review of Systems:   General:  Denies fever, chills Optho/Auditory:   Denies visual changes, blurred vision Respiratory:   Denies SOB, cough, wheeze, DIB  Cardiovascular:   Denies chest pain, palpitations, painful respirations Gastrointestinal:   Denies nausea, vomiting, diarrhea.  Endocrine:     Denies new hot or cold intolerance Musculoskeletal:  Denies joint swelling, gait issues, or new unexplained myalgias/ arthralgias Skin:  Denies rash, suspicious lesions  Neurological:    Denies dizziness, unexplained weakness, numbness  Psychiatric/Behavioral:   Denies mood changes    Objective:     Blood pressure 125/76, pulse 69, height 5\' 3"  (1.6 m), weight 163 lb 4.8 oz (74.1 kg), SpO2 98 %.  Body mass index is 28.93 kg/m.  General: Well Developed, well nourished, and in no acute distress.  HEENT: Normocephalic, atraumatic, pupils equal round reactive to light, neck supple, No carotid bruits, no JVD Skin: Warm and dry, cap RF less 2 sec Cardiac: Regular rate and rhythm, S1, S2 WNL's, no murmurs rubs or gallops Respiratory: ECTA B/L, Not using accessory muscles, speaking in full sentences. NeuroM-Sk: Ambulates w/o assistance, moves ext * 4 w/o difficulty, sensation grossly intact.  Ext: scant edema b/l lower ext Psych: No HI/SI, judgement and insight good, Euthymic mood. Full Affect.

## 2017-10-04 NOTE — Patient Instructions (Signed)
We are taking you off your glipizide as well as your hydrochlorothiazide and changing you to metformin and you will continue your losartan alone for your blood pressure.    Diabetes Mellitus and Standards of Medical Care  Managing diabetes (diabetes mellitus) can be complicated. Your diabetes treatment may be managed by a team of health care providers, including:  A diet and nutrition specialist (registered dietitian).  A nurse.  A certified diabetes educator (CDE).  A diabetes specialist (endocrinologist).  An eye doctor.  A primary care provider.  A dentist.  Your health care providers follow a schedule in order to help you get the best quality of care. The following schedule is a general guideline for your diabetes management plan. Your health care providers may also give you more specific instructions.  HbA1c (hemoglobin A1c) test This test provides information about blood sugar (glucose) control over the previous 2-3 months. It is used to check whether your diabetes management plan needs to be adjusted.  If you are meeting your treatment goals, this test is done at least 2 times a year.  If you are not meeting treatment goals or if your treatment goals have changed, this test is done 4 times a year.  Blood pressure test  This test is done at every routine medical visit. For most people, the goal is less than 130/80. Ask your health care provider what your goal blood pressure should be.  Dental and eye exams  Visit your dentist two times a year.  If you have type 1 diabetes, get an eye exam 3-5 years after you are diagnosed, and then once a year after your first exam. ? If you were diagnosed with type 1 diabetes as a child, get an eye exam when you are age 11 or older and have had diabetes for 3-5 years. After the first exam, you should get an eye exam once a year.  If you have type 2 diabetes, have an eye exam as soon as you are diagnosed, and then once a year after  your first exam.  Foot care exam  Visual foot exams are done at every routine medical visit. The exams check for cuts, bruises, redness, blisters, sores, or other problems with the feet.  A complete foot exam is done by your health care provider once a year. This exam includes an inspection of the structure and skin of your feet, and a check of the pulses and sensation in your feet. ? Type 1 diabetes: Get your first exam 3-5 years after diagnosis. ? Type 2 diabetes: Get your first exam as soon as you are diagnosed.  Check your feet every day for cuts, bruises, redness, blisters, or sores. If you have any of these or other problems that are not healing, contact your health care provider.  Kidney function test (urine microalbumin)  This test is done once a year. ? Type 1 diabetes: Get your first test 5 years after diagnosis. ? Type 2 diabetes: Get your first test as soon as you are diagnosed._  If you have chronic kidney disease (CKD), get a serum creatinine and estimated glomerular filtration rate (eGFR) test once a year.  Lipid profile (cholesterol, HDL, LDL, triglycerides)  This test should be done when you are diagnosed with diabetes, and every 5 years after the first test. If you are on medicines to lower your cholesterol, you may need to get this test done every year. ? The goal for LDL is less than 100 mg/dL (5.5 mmol/L).  If you are at high risk, the goal is less than 70 mg/dL (3.9 mmol/L). ? The goal for HDL is 40 mg/dL (2.2 mmol/L) for men and 50 mg/dL(2.8 mmol/L) for women. An HDL cholesterol of 60 mg/dL (3.3 mmol/L) or higher gives some protection against heart disease. ? The goal for triglycerides is less than 150 mg/dL (8.3 mmol/L).  Immunizations  The yearly flu (influenza) vaccine is recommended for everyone 6 months or older who has diabetes.  The pneumonia (pneumococcal) vaccine is recommended for everyone 2 years or older who has diabetes. If you are 21 or older, you  may get the pneumonia vaccine as a series of two separate shots.  The hepatitis B vaccine is recommended for adults shortly after they have been diagnosed with diabetes.  The Tdap (tetanus, diphtheria, and pertussis) vaccine should be given: ? According to normal childhood vaccination schedules, for children. ? Every 10 years, for adults who have diabetes.  The shingles vaccine is recommended for people who have had chicken pox and are 50 years or older.  Mental and emotional health  Screening for symptoms of eating disorders, anxiety, and depression is recommended at the time of diagnosis and afterward as needed. If your screening shows that you have symptoms (you have a positive screening result), you may need further evaluation and be referred to a mental health care provider.  Diabetes self-management education  Education about how to manage your diabetes is recommended at diagnosis and ongoing as needed.  Treatment plan  Your treatment plan will be reviewed at every medical visit.  Summary  Managing diabetes (diabetes mellitus) can be complicated. Your diabetes treatment may be managed by a team of health care providers.  Your health care providers follow a schedule in order to help you get the best quality of care.  Standards of care including having regular physical exams, blood tests, blood pressure monitoring, immunizations, screening tests, and education about how to manage your diabetes.  Your health care providers may also give you more specific instructions based on your individual health.      Type 2 Diabetes Mellitus, Self Care, Adult Caring for yourself after you have been diagnosed with type 2 diabetes (type 2 diabetes mellitus) means keeping your blood sugar (glucose) under control with a balance of:  Nutrition.  Exercise.  Lifestyle changes.  Medicines or insulin, if necessary.  Support from your team of health care providers and others.  The  following information explains what you need to know to manage your diabetes at home. What do I need to do to manage my blood glucose?  Check your blood glucose every day, as often as told by your health care provider.  Contact your health care provider if your blood glucose is above your target for 2 tests in a row.  Have your A1c (hemoglobin A1c) level checked at least two times a year, or as often as told by your health care provider. Your health care provider will set individualized treatment goals for you. Generally, the goal of treatment is to maintain the following blood glucose levels:  Before meals (preprandial): 80-130 mg/dL (4.4-7.2 mmol/L).  After meals (postprandial): below 180 mg/dL (10 mmol/L).  A1c level: less than 7%.  What do I need to know about hyperglycemia and hypoglycemia? What is hyperglycemia? Hyperglycemia, also called high blood glucose, occurs when blood glucose is too high.Make sure you know the early signs of hyperglycemia, such as:  Increased thirst.  Hunger.  Feeling very tired.  Needing  to urinate more often than usual.  Blurry vision.  What is hypoglycemia? Hypoglycemia, also called low blood glucose, occurswith a blood glucose level at or below 70 mg/dL (3.9 mmol/L). The risk for hypoglycemia increases during or after exercise, during sleep, during illness, and when skipping meals or not eating for a long time (fasting). It is important to know the symptoms of hypoglycemia and treat it right away. Always have a 15-gram rapid-acting carbohydrate snack with you to treat low blood glucose. Family members and close friends should also know the symptoms and should understand how to treat hypoglycemia, in case you are not able to treat yourself. What are the symptoms of hypoglycemia? Hypoglycemia symptoms can include:  Hunger.  Anxiety.  Sweating and feeling clammy.  Confusion.  Dizziness or feeling  light-headed.  Sleepiness.  Nausea.  Increased heart rate.  Headache.  Blurry vision.  Seizure.  Nightmares.  Tingling or numbness around the mouth, lips, or tongue.  A change in speech.  Decreased ability to concentrate.  A change in coordination.  Restless sleep.  Tremors or shakes.  Fainting.  Irritability.  How do I treat hypoglycemia?  If you are alert and able to swallow safely, follow the 15:15 rule:  Take 15 grams of a rapid-acting carbohydrate. Rapid-acting options include: ? 1 tube of glucose gel. ? 3 glucose pills. ? 6-8 pieces of hard candy. ? 4 oz (120 mL) of fruit juice. ? 4 oz (120 mL) of regular (not diet) soda.  Check your blood glucose 15 minutes after you take the carbohydrate.  If the repeat blood glucose level is still at or below 70 mg/dL (3.9 mmol/L), take 15 grams of a carbohydrate again.  If your blood glucose level does not increase above 70 mg/dL (3.9 mmol/L) after 3 tries, seek emergency medical care.  After your blood glucose level returns to normal, eat a meal or a snack within 1 hour.  How do I treat severe hypoglycemia? Severe hypoglycemia is when your blood glucose level is at or below 54 mg/dL (3 mmol/L). Severe hypoglycemia is an emergency. Do not wait to see if the symptoms will go away. Get medical help right away. Call your local emergency services (911 in the [place] Do not drive yourself to the hospital. If you have severe hypoglycemia and you cannot eat or drink, you may need an injection of glucagon. A family member or close friend should learn how to check your blood glucose and how to give you a glucagon injection. Ask your health care provider if you need to have an emergency glucagon injection kit available. Severe hypoglycemia may need to be treated in a hospital. The treatment may include getting glucose through an IV tube. You may also need treatment for the cause of your hypoglycemia. Can having diabetes put me at  risk for other conditions? Having diabetes can put you at risk for other long-term (chronic) conditions, such as heart disease and kidney disease. Your health care provider may prescribe medicines to help prevent complications from diabetes. These medicines may include:  Aspirin.  Medicine to lower cholesterol.  Medicine to control blood pressure.  What else can I do to manage my diabetes? Take your diabetes medicines as told  If your health care provider prescribed insulin or diabetes medicines, take them every day.  Do not run out of insulin or other diabetes medicines that you take. Plan ahead so you always have these available.  If you use insulin, adjust your dosage based on how  physically active you are and what foods you eat. Your health care provider will tell you how to adjust your dosage. Make healthy food choices  The things that you eat and drink affect your blood glucose and your insulin dosage. Making good choices helps to control your diabetes and prevent other health problems. A healthy meal plan includes eating lean proteins, complex carbohydrates, fresh fruits and vegetables, low-fat dairy products, and healthy fats. Make an appointment to see a diet and nutrition specialist (registered dietitian) to help you create an eating plan that is right for you. Make sure that you:  Follow instructions from your health care provider about eating or drinking restrictions.  Drink enough fluid to keep your urine clear or pale yellow.  Eat healthy snacks between nutritious meals.  Track the carbohydrates that you eat. Do this by reading food labels and learning the standard serving sizes of foods.  Follow your sick day plan whenever you cannot eat or drink as usual. Make this plan in advance with your health care provider.  Stay active  Exercise regularly, as told by your health care provider. This may include:  Stretching and doing strength exercises, such as yoga or  weightlifting, at least 2 times a week.  Doing at least 150 minutes of moderate-intensity or vigorous-intensity exercise each week. This could be brisk walking, biking, or water aerobics. ? Spread out your activity over at least 3 days of the week. ? Do not go more than 2 days in a row without doing some kind of physical activity.  When you start a new exercise or activity, work with your health care provider to adjust your insulin, medicines, or food intake as needed. Make healthy lifestyle choices  Do not use any tobacco products, such as cigarettes, chewing tobacco, and e-cigarettes. If you need help quitting, ask your health care provider.  If your health care provider says that alcohol is safe for you, limit alcohol intake to no more than 1 drink per day for nonpregnant women and 2 drinks per day for men. One drink equals 12 oz of beer, 5 oz of wine, or 1 oz of hard liquor.  Learn to manage stress. If you need help with this, ask your health care provider. Care for your body   Keep your immunizations up to date. In addition to getting vaccinations as told by your health care provider, it is recommended that you get vaccinated against the following illnesses: ? The flu (influenza). Get a flu shot every year. ? Pneumonia. ? Hepatitis B.  Schedule an eye exam soon after your diagnosis, and then one time every year after that.  Check your skin and feet every day for cuts, bruises, redness, blisters, or sores. Schedule a foot exam with your health care provider once every year.  Brush your teeth and gums two times a day, and floss at least one time a day. Visit your dentist at least once every 6 months.  Maintain a healthy weight. General instructions  Take over-the-counter and prescription medicines only as told by your health care provider.  Share your diabetes management plan with people in your workplace, school, and household.  Check your urine for ketones when you are ill  and as told by your health care provider.  Ask your health care provider: ? Do I need to meet with a diabetes educator? ? Where can I find a support group for people with diabetes?  Carry a medical alert card or wear medical alert jewelry.  Keep all follow-up visits as told by your health care provider. This is important. Where to find more information: For more information about diabetes, visit:  American Diabetes Association (ADA): www.diabetes.org  American Association of Diabetes Educators (AADE): www.diabeteseducator.org/patient-resources  This information is not intended to replace advice given to you by your health care provider. Make sure you discuss any questions you have with your health care provider. Document Released: 08/30/2015 Document Revised: 10/14/2015 Document Reviewed: 06/11/2015 Elsevier Interactive Patient Education  2017 [place].      Blood Glucose Monitoring, Adult Monitoring your blood sugar (glucose) helps you manage your diabetes. It also helps you and your health care provider determine how well your diabetes management plan is working. Blood glucose monitoring involves checking your blood glucose as often as directed, and keeping a record (log) of your results over time. Why should I monitor my blood glucose? Checking your blood glucose regularly can:  Help you understand how food, exercise, illnesses, and medicines affect your blood glucose.  Let you know what your blood glucose is at any time. You can quickly tell if you are having low blood glucose (hypoglycemia) or high blood glucose (hyperglycemia).  Help you and your health care provider adjust your medicines as needed.  When should I check my blood glucose? Follow instructions from your health care provider about how often to check your blood glucose.   This may depend on:  The type of diabetes you have.  How well-controlled your diabetes is.  Medicines you are taking.  If you have  type 1 diabetes:  Check your blood glucose at least 2 times a day.  Also check your blood glucose: ? Before every insulin injection. ? Before and after exercise. ? Between meals. ? 2 hours after a meal. ? Occasionally between 2:00 a.m. and 3:00 a.m., as directed. ? Before potentially dangerous tasks, like driving or using heavy machinery. ? At bedtime.  You may need to check your blood glucose more often, up to 6-10 times a day: ? If you use an insulin pump. ? If you need multiple daily injections (MDI). ? If your diabetes is not well-controlled. ? If you are ill. ? If you have a history of severe hypoglycemia. ? If you have a history of not knowing when your blood glucose is getting low (hypoglycemia unawareness).  If you have type 2 diabetes:  If you take insulin or other diabetes medicines, check your blood glucose at least 2 times a day.  If you are on intensive insulin therapy, check your blood glucose at least 4 times a day. Occasionally, you may also need to check between 2:00 a.m. and 3:00 a.m., as directed.  Also check your blood glucose: ? Before and after exercise. ? Before potentially dangerous tasks, like driving or using heavy machinery.  You may need to check your blood glucose more often if: ? Your medicine is being adjusted. ? Your diabetes is not well-controlled. ? You are ill.  What is a blood glucose log?  A blood glucose log is a record of your blood glucose readings. It helps you and your health care provider: ? Look for patterns in your blood glucose over time. ? Adjust your diabetes management plan as needed.  Every time you check your blood glucose, write down your result and notes about things that may be affecting your blood glucose, such as your diet and exercise for the day.  Most glucose meters store a record of glucose readings in the meter.  Some meters allow you to download your records to a computer. How do I check my blood  glucose? Follow these steps to get accurate readings of your blood glucose: Supplies needed   Blood glucose meter.  Test strips for your meter. Each meter has its own strips. You must use the strips that come with your meter.  A needle to prick your finger (lancet). Do not use lancets more than once.  A device that holds the lancet (lancing device).  A journal or log book to write down your results.  Procedure  Wash your hands with soap and water.  Prick the side of your finger (not the tip) with the lancet. Use a different finger each time.  Gently rub the finger until a small drop of blood appears.  Follow instructions that come with your meter for inserting the test strip, applying blood to the strip, and using your blood glucose meter.  Write down your result and any notes.  Alternative testing sites  Some meters allow you to use areas of your body other than your finger (alternative sites) to test your blood.  If you think you may have hypoglycemia, or if you have hypoglycemia unawareness, do not use alternative sites. Use your finger instead.  Alternative sites may not be as accurate as the fingers, because blood flow is slower in these areas. This means that the result you get may be delayed, and it may be different from the result that you would get from your finger.  The most common alternative sites are: ? Forearm. ? Thigh. ? Palm of the hand.  Additional tips  Always keep your supplies with you.  If you have questions or need help, all blood glucose meters have a 24-hour "hotline" number that you can call. You may also contact your health care provider.  After you use a few boxes of test strips, adjust (calibrate) your blood glucose meter by following instructions that came with your meter.    The American Diabetes Association suggests the following targets for most nonpregnant adults with diabetes.  More or less stringent glycemic goals may be appropriate  for each individual.  A1C: Less than 7% A1C may also be reported as eAG: Less than 154 mg/dl Before a meal (preprandial plasma glucose): 80-130 mg/dl 1-2 hours after beginning of the meal (Postprandial plasma glucose)*: Less than 180 mg/dl  *Postprandial glucose may be targeted if A1C goals are not met despite reaching preprandial glucose goals.   GOALS in short:  The goals are for the Hgb A1C to be less than 7.0 & blood pressure to be less than 130/80.    It is recommended that all diabetics are educated on and follow a healthy diabetic diet, exercise for 30 minutes 3-4 times per week (walking, biking, swimming, or machine), monitor blood glucose readings and bring that record with you to be reviewed at your next office visit.     You should be checking fasting blood sugars- especially after you eat poorly or eat really healthy, and also check 2 hour postprandial blood sugars after largest meal of the day.    Write these down and bring in your log at each office visit.    You will need to be seen every 3 months by the provider managing your Diabetes unless told otherwise by that provider.   You will need yearly eye exams from an eye specialist and foot exams to check the nerves of your feet.  Also, your urine should be  checked yearly as well to make sure excess protein is not present.   If you are checking your blood pressure at home, please record it and bring it to your next office visit.    Follow the Dietary Approaches to Stop Hypertension (DASH) diet (3 servings of fruit and vegetables daily, whole grains, low sodium, low-fat proteins).  See below.    Lastly, when it comes to your cholesterol, the goal is to have the HDL (good cholesterol) >40, and the LDL (bad cholesterol) <100.   It is recommended that you follow a heart healthy, low saturated and trans-fat diet and exercise for 30 minutes at least 5 times a week.     (( Check out the DASH diet = 1.5 Gram Low Sodium Diet   A 1.5  gram sodium diet restricts the amount of sodium in the diet to no more than 1.5 g or 1500 mg daily.  The American Heart Association recommends Americans over the age of 55 to consume no more than 1500 mg of sodium each day to reduce the risk of developing high blood pressure.  Research also shows that limiting sodium may reduce heart attack and stroke risk.  Many foods contain sodium for flavor and sometimes as a preservative.  When the amount of sodium in a diet needs to be low, it is important to know what to look for when choosing foods and drinks.  The following includes some information and guidelines to help make it easier for you to adapt to a low sodium diet.    QUICK TIPS  Do not add salt to food.  Avoid convenience items and fast food.  Choose unsalted snack foods.  Buy lower sodium products, often labeled as "lower sodium" or "no salt added."  Check food labels to learn how much sodium is in 1 serving.  When eating at a restaurant, ask that your food be prepared with less salt or none, if possible.    READING FOOD LABELS FOR SODIUM INFORMATION  The nutrition facts label is a good place to find how much sodium is in foods. Look for products with no more than 400 mg of sodium per serving.  Remember that 1.5 g = 1500 mg.  The food label may also list foods as:  Sodium-free: Less than 5 mg in a serving.  Very low sodium: 35 mg or less in a serving.  Low-sodium: 140 mg or less in a serving.  Light in sodium: 50% less sodium in a serving. For example, if a food that usually has 300 mg of sodium is changed to become light in sodium, it will have 150 mg of sodium.  Reduced sodium: 25% less sodium in a serving. For example, if a food that usually has 400 mg of sodium is changed to reduced sodium, it will have 300 mg of sodium.    CHOOSING FOODS  Grains  Avoid: Salted crackers and snack items. Some cereals, including instant hot cereals. Bread stuffing and biscuit mixes. Seasoned rice or  pasta mixes.  Choose: Unsalted snack items. Low-sodium cereals, oats, puffed wheat and rice, shredded wheat. English muffins and bread. Pasta.  Meats  Avoid: Salted, canned, smoked, spiced, pickled meats, including fish and poultry. Bacon, ham, sausage, cold cuts, hot dogs, anchovies.  Choose: Low-sodium canned tuna and salmon. Fresh or frozen meat, poultry, and fish.  Dairy  Avoid: Processed cheese and spreads. Cottage cheese. Buttermilk and condensed milk. Regular cheese.  Choose: Milk. Low-sodium cottage cheese. Yogurt. Sour cream. Low-sodium  cheese.  Fruits and Vegetables  Avoid: Regular canned vegetables. Regular canned tomato sauce and paste. Frozen vegetables in sauces. Olives. Angie Fava. Relishes. Sauerkraut.  Choose: Low-sodium canned vegetables. Low-sodium tomato sauce and paste. Frozen or fresh vegetables. Fresh and frozen fruit.  Condiments  Avoid: Canned and packaged gravies. Worcestershire sauce. Tartar sauce. Barbecue sauce. Soy sauce. Steak sauce. Ketchup. Onion, garlic, and table salt. Meat flavorings and tenderizers.  Choose: Fresh and dried herbs and spices. Low-sodium varieties of mustard and ketchup. Lemon juice. Tabasco sauce. Horseradish.    SAMPLE 1.5 GRAM SODIUM MEAL PLAN:   Breakfast / Sodium (mg)  1 cup low-fat milk / 143 mg  1 whole-wheat English muffin / 240 mg  1 tbs heart-healthy margarine / 153 mg  1 hard-boiled egg / 139 mg  1 small orange / 0 mg  Lunch / Sodium (mg)  1 cup raw carrots / 76 mg  2 tbs no salt added peanut butter / 5 mg  2 slices whole-wheat bread / 270 mg  1 tbs jelly / 6 mg   cup red grapes / 2 mg  Dinner / Sodium (mg)  1 cup whole-wheat pasta / 2 mg  1 cup low-sodium tomato sauce / 73 mg  3 oz lean ground beef / 57 mg  1 small side salad (1 cup raw spinach leaves,  cup cucumber,  cup yellow bell pepper) with 1 tsp olive oil and 1 tsp red wine vinegar / 25 mg  Snack / Sodium (mg)  1 container low-fat vanilla yogurt / 107 mg  3  graham cracker squares / 127 mg  Nutrient Analysis  Calories: 1745  Protein: 75 g  Carbohydrate: 237 g  Fat: 57 g  Sodium: 1425 mg  Document Released: 05/08/2005 Document Revised: 01/18/2011 Document Reviewed: 08/09/2009  ExitCare Patient Information 2012 [place].))    This information is not intended to replace advice given to you by your health care provider. Make sure you discuss any questions you have with your health care provider. Document Released: 05/11/2003 Document Revised: 11/26/2015 Document Reviewed: 10/18/2015 Elsevier Interactive Patient Education  2017 [employer].

## 2017-10-29 ENCOUNTER — Telehealth: Payer: Self-pay | Admitting: Family Medicine

## 2017-10-29 NOTE — Telephone Encounter (Signed)
Patient states has questions regarding new medication & request medical assistant call her at [phone].  --forwarding message to medical assistant. --Dion Body

## 2017-10-30 ENCOUNTER — Inpatient Hospital Stay: Admission: RE | Admit: 2017-10-30 | Payer: Medicare HMO | Source: Ambulatory Visit

## 2017-10-30 NOTE — Telephone Encounter (Signed)
Not sure if she tapered dose as we discussed at the [place] and not sure why it has been almost a month since we last seen her/ gave her the medicine, and we're just hearing about it now.   However, if she is taking the glipizide as written and sugars are well controlled, then that is fine for her to continue that treatment regimen.  DC metformin.    -Follow-up as we discussed last office visit and have her bring in log of her blood pressures and blood sugars- fasting and 2 hours postprandial. Thanks, Dr. Jenetta Downer

## 2017-10-30 NOTE — Telephone Encounter (Signed)
Pt states that metformin made her nauseous and she could not tolerate it.  She states that she D/C'd the metformin after 3 days and resumed glipizide, taking 2 tablets.  She states that her blood sugars have been running 117-120 now.  Pt would like to know if she should continue the glipizide or if you want her to go back on the metformin.  If resuming the metformin is what is recommended, any other advice on how she may help curb the nausea associated with this medication.  Charyl Bigger, CMA

## 2017-10-31 DIAGNOSIS — R69 Illness, unspecified: Secondary | ICD-10-CM | POA: Diagnosis not present

## 2017-11-01 NOTE — Telephone Encounter (Signed)
LVM for pt to call to discuss.  T. Caydyn Sprung, CMA  

## 2017-11-13 ENCOUNTER — Ambulatory Visit
Admission: RE | Admit: 2017-11-13 | Discharge: 2017-11-13 | Disposition: A | Discharge status: Home / Self Care | Payer: Medicare HMO | Source: Ambulatory Visit | Attending: Family Medicine | Admitting: Family Medicine

## 2017-11-13 DIAGNOSIS — E2839 Other primary ovarian failure: Secondary | ICD-10-CM

## 2017-11-13 DIAGNOSIS — Z780 Asymptomatic menopausal state: Secondary | ICD-10-CM | POA: Diagnosis not present

## 2017-11-13 DIAGNOSIS — M85851 Other specified disorders of bone density and structure, right thigh: Secondary | ICD-10-CM | POA: Diagnosis not present

## 2017-11-13 NOTE — Telephone Encounter (Signed)
Please document your conversation with pt regarding these issues.  Charyl Bigger, CMA

## 2017-11-13 NOTE — Telephone Encounter (Signed)
Spoke to patient and informed her of Dr. Hershal Coria message.  Per patient her sugar are doing well on just the glipizide, the highest that she has gotten is 120s.  Patient advised to continue to monitor fasting in the morning and again 2 hours after largest meal.  Patient also advised to follow up as discussed at LOV and to follow up sooner if sugars become elevated or if she starts to have any problems or concerns.  Patient expressed understanding. MPulliam, CMA/RT(R)

## 2017-11-27 ENCOUNTER — Telehealth: Payer: Self-pay | Admitting: Family Medicine

## 2017-11-27 DIAGNOSIS — R69 Illness, unspecified: Secondary | ICD-10-CM | POA: Diagnosis not present

## 2017-11-27 NOTE — Telephone Encounter (Signed)
Spoke to patient, she would like to know if Janumet XR is an option since she is not able to tolerate the Metformin due to nausea.  Patient is currently taking Glipizide 5mg  24 hour tab - 2 tabs daily that she was taking before trying the Metformin. Patient states that glucose is ranging from 110 to 125 currently and at times is below 100. Patient has a follow up on 12/19/2017.  Please advise if the patient needs to come in sooner or if any changes in medication is needed at this time. LOV was 10/04/17. MPulliam, CMA/RT(R)

## 2017-11-27 NOTE — Telephone Encounter (Signed)
Patient called while provider & medical assistant in [place] with another patient --advised Rachel Camacho I would forward a message that she wished a return phone call to discuss medicines @ [phone].  --forwarding message to medical assistant.  --Dion Body

## 2017-11-28 NOTE — Telephone Encounter (Signed)
We can discuss at her follow-up office visit.

## 2017-11-28 NOTE — Telephone Encounter (Signed)
Patient notified. MPulliam, CMA/RT(R)  

## 2017-12-19 ENCOUNTER — Encounter: Payer: Self-pay | Admitting: Family Medicine

## 2017-12-19 ENCOUNTER — Ambulatory Visit (INDEPENDENT_AMBULATORY_CARE_PROVIDER_SITE_OTHER): Payer: Medicare HMO | Admitting: Family Medicine

## 2017-12-19 VITALS — BP 130/80 | HR 60 | Ht 63.0 in | Wt 163.6 lb

## 2017-12-19 DIAGNOSIS — I10 Essential (primary) hypertension: Secondary | ICD-10-CM

## 2017-12-19 DIAGNOSIS — Z1211 Encounter for screening for malignant neoplasm of colon: Secondary | ICD-10-CM | POA: Diagnosis not present

## 2017-12-19 DIAGNOSIS — N183 Chronic kidney disease, stage 3 unspecified: Secondary | ICD-10-CM

## 2017-12-19 DIAGNOSIS — E0822 Diabetes mellitus due to underlying condition with diabetic chronic kidney disease: Secondary | ICD-10-CM

## 2017-12-19 DIAGNOSIS — R809 Proteinuria, unspecified: Secondary | ICD-10-CM

## 2017-12-19 DIAGNOSIS — E559 Vitamin D deficiency, unspecified: Secondary | ICD-10-CM

## 2017-12-19 DIAGNOSIS — F39 Unspecified mood [affective] disorder: Secondary | ICD-10-CM

## 2017-12-19 DIAGNOSIS — E1169 Type 2 diabetes mellitus with other specified complication: Secondary | ICD-10-CM | POA: Diagnosis not present

## 2017-12-19 DIAGNOSIS — E782 Mixed hyperlipidemia: Secondary | ICD-10-CM

## 2017-12-19 DIAGNOSIS — E119 Type 2 diabetes mellitus without complications: Secondary | ICD-10-CM

## 2017-12-19 DIAGNOSIS — Z23 Encounter for immunization: Secondary | ICD-10-CM

## 2017-12-19 DIAGNOSIS — E1129 Type 2 diabetes mellitus with other diabetic kidney complication: Secondary | ICD-10-CM

## 2017-12-19 DIAGNOSIS — I152 Hypertension secondary to endocrine disorders: Secondary | ICD-10-CM

## 2017-12-19 DIAGNOSIS — R69 Illness, unspecified: Secondary | ICD-10-CM | POA: Diagnosis not present

## 2017-12-19 DIAGNOSIS — E1159 Type 2 diabetes mellitus with other circulatory complications: Secondary | ICD-10-CM | POA: Diagnosis not present

## 2017-12-19 LAB — POCT GLYCOSYLATED HEMOGLOBIN (HGB A1C): HbA1c, POC (controlled diabetic range): 6.4 % (ref 0.0–7.0)

## 2017-12-19 MED ORDER — METFORMIN HCL ER 500 MG PO TB24: 1000.0000 mg | ORAL_TABLET | Freq: Every day | ORAL | 1 refills | Status: DC

## 2017-12-19 MED ORDER — TETANUS-DIPHTH-ACELL PERTUSSIS 5-2-15.5 LF-MCG/0.5 IM SUSP: 0.5000 mL | Freq: Once | INTRAMUSCULAR | 0 refills | Status: AC

## 2017-12-19 NOTE — Patient Instructions (Addendum)
Rachel Camacho,  As we discussed in the office visit we are transitioning you off glipizide and onto metformin.   Instead of taking it as written on the bottle, please take your Glucophage\ metformin- 1/2 tablet nightly after dinner or after your largest meal of the day.  Try that for 3 days or so and if well tolerated from a GI standpoint, go to 1 full tablet after largest meal of the day.  If you tolerate that well after 3 to 4 days, then take as written on the bottle as toelrated.   -Next office visit we will recheck your vitamin D, CMP, A1c

## 2017-12-19 NOTE — Progress Notes (Signed)
Impression and Recommendations:    1. Diabetes mellitus without complication ([place])   2. Hypertension associated with diabetes ([place])   3. 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 Microalbuminuria due to type 2 diabetes mellitus ([place])   5. Mixed diabetic hyperlipidemia associated with type 2 diabetes mellitus ([place])   6. Mood disorder ([place])   7. Vitamin D deficiency   8. Need for Tdap vaccination   9. Screening for colon cancer      Diabetes -Discontinued Glipizide due to low BS at times -Restarted Metformin START LOW DOSE to 1000mg  twice per day if tol -Discussed best times to check blood sugar and checking if she feels shaky or dizzy - A1c, cmp with next follow up in 3 months    HTN -HTN is within goal; discussed goal BP readings as less than 140/90 for age.  -Encouraged patient to continue monitoring bp and recording numbers. Requested patient to bring a record for next follow up -cont ARB  Kidneys/microalbumin associated with diabetes --PT will continue ARB; see med list -Serine/creatine WNL; reviewed last labs in Feb -Urine microalb Last checked 06/2017, will recheck yearly  HLD -Continue taking lipitor -LDL at goal  Lifestyle -Discussed possible allergies causing facial swelling at night -patient is unsure and symptoms not too bothersome at this time.  We will continue to monitor and let us know in the future, red flag symptoms discussed with patient which she had none today -Recommended referral for allergist if symptoms worse; patient declined today -Offered suggestions for home hygiene and changes to help alleviate allergies  -Please realize, EXERCISE IS MEDICINE! -  American Heart Association ( AHA) guidelines for exercise : you should engage in 150 minutes of moderate intensity aerobic activity per week.  - Engaging in regular exercise will improve brain function and  memory, as well as improve mood, boost immune system and help with weight management.   - Other beneficial effects of exercise: decreasing blood sugar levels, decreasing blood pressure,  and decreasing bad cholesterol levels/ increasing good cholesterol levels.   -  The AHA strongly endorses consumption of a diet that contains a variety of foods from all the food categories with an emphasis on fruits and vegetables; fat-free and low-fat dairy products; cereal and grain products; legumes and nuts; and fish, poultry, and/or extra lean meats.     - Excessive food intake, especially of foods high in saturated and trans fats, sugar, and salt, should be avoided.     - Adequate water intake of roughly 1/2 of your weight in pounds, should equal the ounces of water per day you should drink.  So for instance, if you're 200 pounds, that would be 100 ounces of water per day   Mood -Mood stable; continue on current medication   Education and routine counseling performed. Handouts provided.  Orders Placed This Encounter  Procedures  . Comprehensive metabolic panel  . Hemoglobin A1c  . VITAMIN D 25 Hydroxy (Vit-D Deficiency, Fractures)  . Ambulatory referral to Gastroenterology  . POCT glycosylated hemoglobin (Hb A1C)    Medications Discontinued During This Encounter  Medication Reason      . glipiZIDE (GLUCOTROL XL) 5 MG 24 hr tablet       Meds ordered this encounter  Medications  . Tdap (ADACEL) 09-20-13.5 LF-MCG/0.5 injection    Sig: Inject 0.5 mLs into the muscle once for 1 dose.  Dispense:  0.5 mL    Refill:  0  . metFORMIN (GLUCOPHAGE XR) 500 MG 24 hr tablet    Sig: Take 2 tablets (1,000 mg total) by mouth daily after supper.    Dispense:  180 tablet    Refill:  1    Return for 3 mo f/up- for changes in med- back to metfromin.   The patient was counseled, risk factors were discussed, anticipatory guidance given.  Gross side effects, risk and benefits, and alternatives of  medications discussed with patient.  Patient is aware that all medications have potential side effects and we are unable to predict every side effect or drug-drug interaction that may occur.  Expresses verbal understanding and consents to current therapy plan and treatment regimen.  Please see AVS handed out to patient at the end of our visit for further patient instructions/ counseling done pertaining to today's office visit.    Note:  This document was prepared using Dragon voice recognition software and may include unintentional dictation errors.  I have reviewed the above documentation for accuracy and completeness, and I agree with the above.    This document serves as a record of services personally performed by Mellody Dance, MD. It was created on her behalf by Georga Bora, a trained medical scribe. The creation of this record is based on the scribe's personal observations and the provider's statements to them.   I have reviewed the above medical documentation for accuracy and completeness and I concur.  Mellody Dance 12/19/17 3:23 PM    Subjective:    Chief Complaint  Patient presents with  . Follow-up      Rachel Camacho is a 72 y.o. female who presents to [place] at [facility] today for Diabetes Management.     Lifestyle -Patient states she has experienced some facial swelling twice per week when she wakes up -Denies rash, SOB, runny nose -States she is taking Vitamin D supplements regularly -Has been increasing vegetable intake because her friend is a Psychologist, sport and exercise and gives her free vegetables  DM -A1C is 6.4 today - She has been working on diet and exercise for diabetes  -Pt is currently maintained on the following medications for diabetes; see med list today  -Patient states she has been having issues with metformin and stopped taking it after 2 days due to nausea, dizziness, and diarrhea. She was taking 500mg  XR metformin  -States she  occasionally has lows with shakiness because she forgets to eat   -Home glucose readings is 64 at lowest. Has been regularly checking sugar at home   -Denies polyuria/polydipsia.  Last diabetic eye exam was No results found for: HMDIABEYEEXA  Foot exam- UTD  Last A1C in the office was:  Lab Results  Component Value Date   HGBA1C 6.4 12/19/2017   HGBA1C 6.9 10/04/2017   HGBA1C 6.9 07/04/2017    Lab Results  Component Value Date   MICROALBUR 80 07/09/2017   [place] 76 07/12/2017   CREATININE 0.95 07/12/2017     HTN -States her blood pressure is higher in the morning but when checked again later, it is along regular levels -Has been checking her BP at home and logging, but she forgot the record at home -Denies SOB, dizziness on exertion, CP  Last 3 blood pressure readings in our office are as follows: BP Readings from Last 3 Encounters:  12/19/17 130/80  10/04/17 125/76  07/04/17 128/86    BMI Readings from Last 3 Encounters:  12/19/17  28.98 kg/m  10/04/17 28.93 kg/m  07/04/17 29.71 kg/m     No problems updated.    Patient Care Team    Relationship Specialty Notifications Start End  Mellody Dance, DO PCP - General Family Medicine  07/04/17   Jamal Maes, MD Consulting Physician Nephrology  07/04/17      Patient Active Problem List   Diagnosis Date Noted  . Vitamin D deficiency 07/16/2017  . Diabetes mellitus without complication ([place]) 38/46/6599  . Hypertension associated with diabetes ([place]) 07/04/2017  . 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 Microalbuminuria due to type 2 diabetes mellitus ([place]) 07/04/2017  . Mixed diabetic hyperlipidemia associated with type 2 diabetes mellitus ([place]) 07/04/2017  . Mood disorder ([place]) 07/04/2017     Past Medical History:  Diagnosis Date  . Depression   . Diabetes mellitus without complication ([place])   .  Hyperlipidemia   . Hypertension      No past surgical history on file.   No family history on file.   Social History   Substance and Sexual Activity  Drug Use No  ,  Social History   Substance and Sexual Activity  Alcohol Use Yes   Comment: social  ,  Social History   Tobacco Use  Smoking Status Never Smoker  Smokeless Tobacco Never Used  ,    Current Outpatient Medications on File Prior to Visit  Medication Sig Dispense Refill  . atorvastatin (LIPITOR) 20 MG tablet Take 1 tablet (20 mg total) by mouth at bedtime. 90 tablet 1  . Calcium Carbonate-Vit D-Min (CALCIUM 1200 PO) Take 1 tablet by mouth daily.    . Cholecalciferol (VITAMIN D-3) 5000 units TABS Take 1 tablet by mouth daily. 30 tablet   . citalopram (CELEXA) 20 MG tablet Take 1 tablet (20 mg total) by mouth daily. 90 tablet 1  . glucose blood test strip Use to check fasting glucose in the AM and to check 2 hours after largest meal of the day. 100 each 12  . losartan (COZAAR) 100 MG tablet Take 1 tablet (100 mg total) by mouth daily. 90 tablet 1  . ONE TOUCH LANCETS MISC Use to check glucose levels fasting in the AM and 2 hours after largest meal.  Patient requested One Touch Verio Flex lancets. 200 each 5   No current facility-administered medications on file prior to visit.      Allergies  Allergen Reactions  . Penicillins Hives     Review of Systems:   General:  Denies fever, chills Optho/Auditory:   Denies visual changes, blurred vision Respiratory:   Denies SOB, cough, wheeze, DIB  Cardiovascular:   Denies chest pain, palpitations, painful respirations Gastrointestinal:   Denies nausea, vomiting, diarrhea.  Endocrine:     Denies new hot or cold intolerance Musculoskeletal:  Denies joint swelling, gait issues, or new unexplained myalgias/ arthralgias Skin:  Denies rash, suspicious lesions  Neurological:    Denies dizziness, unexplained weakness, numbness  Psychiatric/Behavioral:   Denies mood  changes    Objective:     Blood pressure 130/80, pulse 60, height 5\' 3"  (1.6 m), weight 163 lb 9.6 oz (74.2 kg), SpO2 98 %.  Body mass index is 28.98 kg/m.  General: Well Developed, well nourished, and in no acute distress.  HEENT: Normocephalic, atraumatic, pupils equal round reactive to light, neck supple, No carotid bruits, no JVD Skin: Warm and dry, cap RF less 2 sec  Cardiac: Regular rate and rhythm, S1, S2 WNL's, no murmurs rubs or gallops Respiratory: ECTA B/L, Not using accessory muscles, speaking in full sentences. NeuroM-Sk: Ambulates w/o assistance, moves ext * 4 w/o difficulty, sensation grossly intact.  Ext: scant edema b/l lower ext Psych: No HI/SI, judgement and insight good, Euthymic mood. Full Affect.

## 2017-12-31 ENCOUNTER — Ambulatory Visit (INDEPENDENT_AMBULATORY_CARE_PROVIDER_SITE_OTHER): Payer: Medicare HMO

## 2017-12-31 DIAGNOSIS — Z111 Encounter for screening for respiratory tuberculosis: Secondary | ICD-10-CM

## 2017-12-31 NOTE — Progress Notes (Signed)
HPI:  Patient is here for PPD placement.  A&P:  Patient tolerated injection well without complications.  T. Avalynne Diver, CMA  

## 2018-01-02 ENCOUNTER — Other Ambulatory Visit (INDEPENDENT_AMBULATORY_CARE_PROVIDER_SITE_OTHER): Payer: Medicare HMO

## 2018-01-02 DIAGNOSIS — Z111 Encounter for screening for respiratory tuberculosis: Secondary | ICD-10-CM

## 2018-01-02 NOTE — Progress Notes (Signed)
HPI:  Patient is here for PPD read.  A&P:  Results were negative, 0 mm induration.  Charyl Bigger, CMA

## 2018-01-14 DIAGNOSIS — E119 Type 2 diabetes mellitus without complications: Secondary | ICD-10-CM | POA: Diagnosis not present

## 2018-01-14 DIAGNOSIS — R69 Illness, unspecified: Secondary | ICD-10-CM | POA: Diagnosis not present

## 2018-01-14 DIAGNOSIS — H25013 Cortical age-related cataract, bilateral: Secondary | ICD-10-CM | POA: Diagnosis not present

## 2018-01-14 DIAGNOSIS — H5213 Myopia, bilateral: Secondary | ICD-10-CM | POA: Diagnosis not present

## 2018-01-14 LAB — HM DIABETES EYE EXAM

## 2018-01-16 DIAGNOSIS — R69 Illness, unspecified: Secondary | ICD-10-CM | POA: Diagnosis not present

## 2018-02-28 ENCOUNTER — Encounter: Payer: Self-pay | Admitting: Nephrology

## 2018-02-28 DIAGNOSIS — I129 Hypertensive chronic kidney disease with stage 1 through stage 4 chronic kidney disease, or unspecified chronic kidney disease: Secondary | ICD-10-CM | POA: Diagnosis not present

## 2018-02-28 DIAGNOSIS — E119 Type 2 diabetes mellitus without complications: Secondary | ICD-10-CM | POA: Diagnosis not present

## 2018-02-28 DIAGNOSIS — N052 Unspecified nephritic syndrome with diffuse membranous glomerulonephritis: Secondary | ICD-10-CM | POA: Diagnosis not present

## 2018-02-28 DIAGNOSIS — Z23 Encounter for immunization: Secondary | ICD-10-CM | POA: Diagnosis not present

## 2018-02-28 DIAGNOSIS — E785 Hyperlipidemia, unspecified: Secondary | ICD-10-CM | POA: Diagnosis not present

## 2018-03-03 DIAGNOSIS — R69 Illness, unspecified: Secondary | ICD-10-CM | POA: Diagnosis not present

## 2018-04-09 ENCOUNTER — Ambulatory Visit (INDEPENDENT_AMBULATORY_CARE_PROVIDER_SITE_OTHER): Payer: Medicare HMO | Admitting: Family Medicine

## 2018-04-09 ENCOUNTER — Encounter: Payer: Self-pay | Admitting: Family Medicine

## 2018-04-09 VITALS — BP 112/77 | HR 73 | Temp 98.0°F | Ht 63.0 in | Wt 160.2 lb

## 2018-04-09 DIAGNOSIS — E782 Mixed hyperlipidemia: Secondary | ICD-10-CM | POA: Diagnosis not present

## 2018-04-09 DIAGNOSIS — I152 Hypertension secondary to endocrine disorders: Secondary | ICD-10-CM

## 2018-04-09 DIAGNOSIS — F39 Unspecified mood [affective] disorder: Secondary | ICD-10-CM | POA: Diagnosis not present

## 2018-04-09 DIAGNOSIS — E2839 Other primary ovarian failure: Secondary | ICD-10-CM | POA: Diagnosis not present

## 2018-04-09 DIAGNOSIS — E1169 Type 2 diabetes mellitus with other specified complication: Secondary | ICD-10-CM | POA: Diagnosis not present

## 2018-04-09 DIAGNOSIS — I10 Essential (primary) hypertension: Secondary | ICD-10-CM | POA: Diagnosis not present

## 2018-04-09 DIAGNOSIS — E559 Vitamin D deficiency, unspecified: Secondary | ICD-10-CM | POA: Diagnosis not present

## 2018-04-09 DIAGNOSIS — E1159 Type 2 diabetes mellitus with other circulatory complications: Secondary | ICD-10-CM

## 2018-04-09 DIAGNOSIS — R69 Illness, unspecified: Secondary | ICD-10-CM | POA: Diagnosis not present

## 2018-04-09 DIAGNOSIS — E119 Type 2 diabetes mellitus without complications: Secondary | ICD-10-CM

## 2018-04-09 DIAGNOSIS — Z23 Encounter for immunization: Secondary | ICD-10-CM

## 2018-04-09 LAB — POCT GLYCOSYLATED HEMOGLOBIN (HGB A1C): Hemoglobin A1C: 6.6 % — AB (ref 4.0–5.6)

## 2018-04-09 MED ORDER — PNEUMOCOCCAL VAC POLYVALENT 25 MCG/0.5ML IJ INJ
0.5000 mL | INJECTION | INTRAMUSCULAR | Status: AC
  Administered 2018-04-09: 0.5 mL via INTRAMUSCULAR

## 2018-04-09 MED ORDER — CITALOPRAM HYDROBROMIDE 20 MG PO TABS: 10.0000 mg | ORAL_TABLET | Freq: Every day | ORAL | 1 refills | Status: DC

## 2018-04-09 NOTE — Patient Instructions (Addendum)
Will decrease Celexa to 10 mg EVERY DAY-instead of your 20 mg every other day.   Please just cut your 20s in half and take half a tab daily until those are gone then, we can send a new prescription.  Call in to the clinic for further advice if you feel that your mood is not managed well on 10 mg daily. Follow-up for mood assessment as recommended.

## 2018-04-09 NOTE — Progress Notes (Signed)
Impression and Recommendations:    1. Diabetes mellitus without complication ([place])   2. Hypertension associated with diabetes ([place])   3. Mixed diabetic hyperlipidemia associated with type 2 diabetes mellitus ([place])   4. Mood disorder ([place])   5. Vitamin D deficiency   6. Need for pneumococcal vaccine   7. Estrogen deficiency    1. Diabetes Mellitus - Restarted metformin last visit. - A1c is 6.6 today. - Continue treatment plan as prescribed.  See med list below. - Patient tolerating meds well without complication.  Denies S-E.  - Will continue to monitor.  - Strongly encouraged patient to take metformin after lunch every day, or after breakfast every day, or after supper every day.  Per patient, sometimes she doesn't eat.  - Advised patient to eat regularly every day; continue with adequate food intake and continue with 2 tablets after supper.  Reviewed that she can even eat a cup of yogurt, cup of cottage cheese, etc.  - Educated patient about medication, importance of adequate nutrition, and answered all questions about health at length.  - Counseled patient on pathophysiology of disease and discussed various treatment options, which often includes dietary and lifestyle modifications as first line.  Importance of low carb/ketogenic diet discussed with patient in addition to regular exercise.   - Check FBS and 2 hours after the biggest meal of your day.  Keep log and bring in next OV for my review.   Also, if you ever feel poorly, please check your blood pressure and blood sugar, as one or the other could be the cause of your symptoms.  - Being a diabetic, you need yearly eye and foot exams. Make appt.for diabetic eye exam.  2. Hypertension Associated with Diabetes - Stable at this time. - Continue treatment plan as prescribed.  See med list below. - Patient tolerating meds well without complication.  Denies S-E  - Lifestyle changes such as dash diet and engaging in a  regular exercise program discussed with patient.  Educational handouts provided  - Ambulatory BP monitoring encouraged. Keep log and bring in next OV.  3. Hyperlipidemia Associated with Diabetes - Stable at this time.  Last checked 06/2017. - Continue treatment plan as prescribed.  See med list below. - Patient tolerating meds well without complication.  Denies S-E.  - Will continue to monitor.  - Dietary changes such as low saturated & trans fat and low carb/ ketogenic diets discussed with patient.  Encouraged regular exercise and weight loss when appropriate.   4. Mood - Patient wishes to wean off of Celexa. - Reduced to 10 mg daily.   - Patient may wean down to 5 mg daily if mood remains controlled.  - Mood remains stable at this time. - Treatment plan changed today.  See med list below. - Patient tolerating meds well without complication.  Denies S-E  - Reviewed the "spokes of the wheel" of mood and health management.  Stressed the importance of ongoing prudent habits, including regular exercise, appropriate sleep hygiene, healthful dietary habits, and prayer/meditation to relax.  5. BMI Counseling Explained to patient what BMI refers to, and what it means medically.    Told patient to think about it as a "medical risk stratification measurement" and how increasing BMI is associated with increasing risk/ or worsening state of various diseases such as hypertension, hyperlipidemia, diabetes, premature OA, depression etc.  American Heart Association guidelines for healthy diet, basically Mediterranean diet, and exercise guidelines of 30 minutes  5 days per week or more discussed in detail.  Health counseling performed.  All questions answered.  6. Lifestyle & Preventative Health Maintenance - Advised patient to continue working toward exercising to improve overall mental, physical, and emotional health.    - Encouraged patient to engage in daily physical activity, especially a formal  exercise routine.  Recommended that the patient eventually strive for at least 150 minutes of moderate cardiovascular activity per week according to guidelines established by the [facility].   - Healthy dietary habits encouraged, including low-carb, and high amounts of lean protein in diet.   - Patient should also consume adequate amounts of water.  7. Follow-Up - Prescriptions refilled today PRN. - Re-check fasting lab work as recommended next appointment. - Otherwise, continue to return for CPE and chronic follow-up as scheduled.   - Patient knows to call in sooner if desired to address acute concerns.    Orders Placed This Encounter  Procedures  . CBC with Differential/Platelet  . Comprehensive metabolic panel  . Hemoglobin A1c  . Lipid panel  . T4, free  . TSH  . VITAMIN D 25 Hydroxy (Vit-D Deficiency, Fractures)  . B12  . POCT glycosylated hemoglobin (Hb A1C)    Medications Discontinued During This Encounter  Medication Reason  . citalopram (CELEXA) 20 MG tablet       Meds ordered this encounter  Medications  . citalopram (CELEXA) 20 MG tablet    Sig: Take 0.5 tablets (10 mg total) by mouth daily.    Dispense:  90 tablet    Refill:  1  . pneumococcal 23 valent vaccine (PNU-IMMUNE) injection 0.5 mL    The patient was counseled, risk factors were discussed, anticipatory guidance given.  Gross side effects, risk and benefits, and alternatives of medications discussed with patient.  Patient is aware that all medications have potential side effects and we are unable to predict every side effect or drug-drug interaction that may occur.  Expresses verbal understanding and consents to current therapy plan and treatment regimen.   Return for Follow-up 4 months for diabetes, mood, blood pressure with full set of FBW 3-day prior.   Please see AVS handed out to patient at the end of our visit for further patient instructions/ counseling done pertaining to today's office visit.      Note:  This document was prepared using Dragon voice recognition software and may include unintentional dictation errors.  This document serves as a record of services personally performed by Mellody Dance, DO. It was created on her behalf by Toni Amend, a trained medical scribe. The creation of this record is based on the scribe's personal observations and the provider's statements to them.   I have reviewed the above medical documentation for accuracy and completeness and I concur.  Mellody Dance, DO 04/15/2018 6:29 AM        Subjective:    Chief Complaint  Patient presents with  . Follow-up    Rachel Camacho is a 72 y.o. female who presents to [facility] today for Diabetes Management.    Patient has been taking Lipitor at night as prescribed.  She has been managed on Lipitor for a long time.  Continues on losartan as prescribed.  States that her blood pressure goes up sometimes.  Patient wants to discontinue use of Celexa, but agrees to reduce dose instead.  Patient feels her energy levels have improved since starting Vitamin D supplementation.  DM HPI: -  She has  been working on diet and exercise for diabetes  She has started exercising again regularly.  She works out at [employer].  Pt is currently maintained on the following medications for diabetes:   see med list today.  Patient continues taking metformin.  States now she is taking two pills.  Medication compliance - States "sometimes if I don't eat, I don't take it."  States sometimes she eats lunch but doesn't eat dinner.    Feels she did not take her metformin about 10-20% of the time.  Home glucose readings range: Fasting blood sugars were 157, 143 when she was on 1/2 One one pill, 146, 143, 137. On two pills, 142, 124, 125, 122, 138, 118, 120, 124, 109, 110, 124. One time her fasting sugar was 47.  Notes she "felt fine." Denies symptoms of experiencing lows.  Notes  in the past she has passed out once due to low blood sugar.   Denies polyuria/polydipsia. Denies hypo/ hyperglycemia symptoms - She denies new onset of: chest pain, exercise intolerance, shortness of breath, dizziness, visual changes, headache, lower extremity swelling or claudication.   Last diabetic eye exam was  Lab Results  Component Value Date   HMDIABEYEEXA No Retinopathy 01/14/2018   HMDIABEYEEXA No Retinopathy 01/14/2018    Foot exam- UTD  Last A1C in the office was:  Lab Results  Component Value Date   HGBA1C 6.6 (A) 04/09/2018   HGBA1C 6.4 12/19/2017   HGBA1C 6.9 10/04/2017    Lab Results  Component Value Date   MICROALBUR 80 07/09/2017   LDLCALC 76 07/12/2017   CREATININE 0.95 07/12/2017     Last 3 blood pressure readings in our office are as follows: BP Readings from Last 3 Encounters:  04/09/18 112/77  12/19/17 130/80  10/04/17 125/76    BMI Readings from Last 3 Encounters:  04/09/18 28.38 kg/m  12/19/17 28.98 kg/m  10/04/17 28.93 kg/m     No problems updated.    Patient Care Team    Relationship Specialty Notifications Start End  Mellody Dance, DO PCP - General Family Medicine  07/04/17   Jamal Maes, MD Consulting Physician Nephrology  07/04/17      Patient Active Problem List   Diagnosis Date Noted  . Vitamin D deficiency 07/16/2017  . Diabetes mellitus without complication ([place]) 53/29/9242  . Hypertension associated with diabetes ([place]) 07/04/2017  . 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 Microalbuminuria due to type 2 diabetes mellitus ([place]) 07/04/2017  . Mixed diabetic hyperlipidemia associated with type 2 diabetes mellitus ([place]) 07/04/2017  . Mood disorder ([place]) 07/04/2017  . 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 Depression   . Diabetes mellitus without  complication ([place])   . Hyperlipidemia   . Hypertension   . 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 Intial UPC 4.7 grams. Conservative Rx. 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 Lorrene Reid)     History reviewed. No pertinent surgical history.   History reviewed. No pertinent family history.   Social History   Substance and Sexual Activity  Drug Use No  ,  Social History   Substance and Sexual Activity  Alcohol Use Yes   Comment: social  ,  Social History   Tobacco Use  Smoking Status Never Smoker  Smokeless Tobacco Never Used  ,    Current  Outpatient Medications on File Prior to Visit  Medication Sig Dispense Refill  . atorvastatin (LIPITOR) 20 MG tablet Take 1 tablet (20 mg total) by mouth at bedtime. 90 tablet 1  . Calcium Carbonate-Vit D-Min (CALCIUM 1200 PO) Take 1 tablet by mouth daily.    . Cholecalciferol (VITAMIN D-3) 5000 units TABS Take 1 tablet by mouth daily. 30 tablet   . glucose blood test strip Use to check fasting glucose in the AM and to check 2 hours after largest meal of the day. 100 each 12  . losartan (COZAAR) 100 MG tablet Take 1 tablet (100 mg total) by mouth daily. 90 tablet 1  . metFORMIN (GLUCOPHAGE XR) 500 MG 24 hr tablet Take 2 tablets (1,000 mg total) by mouth daily after supper. 180 tablet 1  . ONE TOUCH LANCETS MISC Use to check glucose levels fasting in the AM and 2 hours after largest meal.  Patient requested One Touch Verio Flex lancets. 200 each 5   No current facility-administered medications on file prior to visit.      Allergies  Allergen Reactions  . Penicillins Hives     Review of Systems:   General:  Denies fever, chills Optho/Auditory:   Denies visual changes, blurred vision Respiratory:   Denies SOB, cough, wheeze, DIB  Cardiovascular:   Denies chest pain, palpitations, painful respirations Gastrointestinal:   Denies nausea, vomiting, diarrhea.  Endocrine:     Denies new hot  or cold intolerance Musculoskeletal:  Denies joint swelling, gait issues, or new unexplained myalgias/ arthralgias Skin:  Denies rash, suspicious lesions  Neurological:    Denies dizziness, unexplained weakness, numbness  Psychiatric/Behavioral:   Denies mood changes    Objective:     Blood pressure 112/77, pulse 73, temperature 98 F (36.7 C), height 5\' 3"  (1.6 m), weight 160 lb 3.2 oz (72.7 kg), SpO2 98 %.  Body mass index is 28.38 kg/m.  General: Well Developed, well nourished, and in no acute distress.  HEENT: Normocephalic, atraumatic, pupils equal round reactive to light, neck supple, No carotid bruits, no JVD Skin: Warm and dry, cap RF less 2 sec Cardiac: Regular rate and rhythm, S1, S2 WNL's, no murmurs rubs or gallops Respiratory: ECTA B/L, Not using accessory muscles, speaking in full sentences. NeuroM-Sk: Ambulates w/o assistance, moves ext * 4 w/o difficulty, sensation grossly intact.  Ext: scant edema b/l lower ext Psych: No HI/SI, judgement and insight good, Euthymic mood. Full Affect.

## 2018-04-19 ENCOUNTER — Other Ambulatory Visit: Payer: Self-pay | Admitting: Family Medicine

## 2018-04-19 DIAGNOSIS — E119 Type 2 diabetes mellitus without complications: Secondary | ICD-10-CM

## 2018-04-23 DIAGNOSIS — R69 Illness, unspecified: Secondary | ICD-10-CM | POA: Diagnosis not present

## 2018-04-24 DIAGNOSIS — Z86010 Personal history of colonic polyps: Secondary | ICD-10-CM | POA: Diagnosis not present

## 2018-04-24 DIAGNOSIS — K621 Rectal polyp: Secondary | ICD-10-CM | POA: Diagnosis not present

## 2018-04-24 DIAGNOSIS — D123 Benign neoplasm of transverse colon: Secondary | ICD-10-CM | POA: Diagnosis not present

## 2018-04-24 DIAGNOSIS — D125 Benign neoplasm of sigmoid colon: Secondary | ICD-10-CM | POA: Diagnosis not present

## 2018-04-24 DIAGNOSIS — D120 Benign neoplasm of cecum: Secondary | ICD-10-CM | POA: Diagnosis not present

## 2018-04-24 LAB — HM COLONOSCOPY

## 2018-04-26 DIAGNOSIS — K621 Rectal polyp: Secondary | ICD-10-CM | POA: Diagnosis not present

## 2018-04-26 DIAGNOSIS — D125 Benign neoplasm of sigmoid colon: Secondary | ICD-10-CM | POA: Diagnosis not present

## 2018-04-26 DIAGNOSIS — D120 Benign neoplasm of cecum: Secondary | ICD-10-CM | POA: Diagnosis not present

## 2018-04-26 DIAGNOSIS — D123 Benign neoplasm of transverse colon: Secondary | ICD-10-CM | POA: Diagnosis not present

## 2018-05-09 ENCOUNTER — Encounter: Payer: Self-pay | Admitting: Family Medicine

## 2018-05-09 ENCOUNTER — Ambulatory Visit (INDEPENDENT_AMBULATORY_CARE_PROVIDER_SITE_OTHER): Payer: Medicare HMO | Admitting: Family Medicine

## 2018-05-09 VITALS — BP 132/84 | HR 61 | Temp 97.6°F | Ht 63.0 in | Wt 160.4 lb

## 2018-05-09 DIAGNOSIS — Z8719 Personal history of other diseases of the digestive system: Secondary | ICD-10-CM

## 2018-05-09 NOTE — Patient Instructions (Signed)
Ventral Hernia    A ventral hernia is a bulge of tissue from inside the abdomen that pushes through a weak area of the muscles that form the front wall of the abdomen. The tissues inside the abdomen are inside a sac (peritoneum). These tissues include the small intestine, large intestine, and the fatty tissue that covers the intestines (omentum). Sometimes, the bulge that forms a hernia contains intestines. Other hernias contain only fat. Ventral hernias do not go away without surgical treatment.  There are several types of ventral hernias. You may have:   A hernia at an incision site from previous abdominal surgery (incisional hernia).   A hernia just above the belly button (epigastric hernia), or at the belly button (umbilical hernia). These types of hernias can develop from heavy lifting or straining.   A hernia that comes and goes (reducible hernia). It may be visible only when you lift or strain. This type of hernia can be pushed back into the abdomen (reduced).   A hernia that traps abdominal tissue inside the hernia (incarcerated hernia). This type of hernia does not reduce.   A hernia that cuts off blood flow to the tissues inside the hernia (strangulated hernia). The tissues can start to die if this happens. This is a very painful bulge that cannot be reduced. A strangulated hernia is a medical emergency.  What are the causes?  This condition is caused by abdominal tissue putting pressure on an area of weakness in the abdominal muscles.  What increases the risk?  The following factors may make you more likely to develop this condition:   Being female.   Being 60 or older.   Being overweight or obese.   Having had previous abdominal surgery, especially if there was an infection after surgery.   Having had an injury to the abdominal wall.   Having had several pregnancies.   Having a buildup of fluid inside the abdomen (ascites).  What are the signs or symptoms?  The only symptom of a ventral hernia  may be a painless bulge in the abdomen. A reducible hernia may be visible only when you strain, cough, or lift. Other symptoms may include:   Dull pain.   A feeling of pressure.  Signs and symptoms of a strangulated hernia may include:   Increasing pain.   Nausea and vomiting.   Pain when pressing on the hernia.   The skin over the hernia turning red or purple.   Constipation.   Blood in the stool (feces).  How is this diagnosed?  This condition may be diagnosed based on:   Your symptoms.   Your medical history.   A physical exam. You may be asked to cough or strain while standing. These actions increase the pressure inside your abdomen and force the hernia through the opening in your muscles. Your health care provider may try to reduce the hernia by pressing on it.   Imaging studies, such as an ultrasound or CT scan.  How is this treated?  This condition is treated with surgery. If you have a strangulated hernia, surgery is done as soon as possible. If your hernia is small and not incarcerated, you may be asked to lose some weight before surgery.  Follow these instructions at home:   Follow instructions from your health care provider about eating or drinking restrictions.   If you are overweight, your health care provider may recommend that you increase your activity level and eat a healthier diet.     Do not lift anything that is heavier than 10 lb (4.5 kg).   Return to your normal activities as told by your health care provider. Ask your health care provider what activities are safe for you. You may need to avoid activities that increase pressure on your hernia.   Take over-the-counter and prescription medicines only as told by your health care provider.   Keep all follow-up visits as told by your health care provider. This is important.  Contact a health care provider if:   Your hernia gets larger.   Your hernia becomes painful.  Get help right away if:   Your hernia becomes increasingly  painful.   You have pain along with any of the following:  ? Changes in skin color in the area of the hernia.  ? Nausea.  ? Vomiting.  ? Fever.  Summary   A ventral hernia is a bulge of tissue from inside the abdomen that pushes through a weak area of the muscles that form the front wall of the abdomen.   This condition is treated with surgery, which may be urgent depending on your hernia.   Do not lift anything that is heavier than 10 lb (4.5 kg), and follow activity instructions from your health care provider.  This information is not intended to replace advice given to you by your health care provider. Make sure you discuss any questions you have with your health care provider.  Document Released: 04/24/2012 Document Revised: 06/20/2017 Document Reviewed: 11/27/2016  [employer]vier Interactive Patient Education  2019 [employer]

## 2018-05-09 NOTE — Progress Notes (Signed)
\  Pt here for an acute care OV today   Impression and Recommendations:    1. H/O ventral hernia- MOST LIKLEY- supra-umbilical      H/O ventral hernia- MOST LIKLEY- supra-umbilical  1. Abdominal Concern - MOST LIKELY H/o Ventral Hernia, Supra-Umbilical - Patient with abdominal concerns, onset six days ago, but normal abdominal exam today.   - Advised patient that she may be experiencing a hernia.  - Extensively educated the patient about the nature of hernias during appointment today.  - Discussed that as long as the area of concern "goes back in" and retracts back into her abdomen, she should not be concerned.  Reviewed red flag symptoms with the patient today.    - Discussed that if she begins experiencing nausea or vomiting, develops food intolerance, or is suddenly unable to eat or pass stool, she should seek medical attention.  Otherwise, advised patient that there is nothing to be done to address this at this time.  - Reviewed that if patient's pain returns or becomes severe with tenderness to touch, patient should seek immediate medical attention.  - Advised patient on proper breathing techniques during activities such as abdominal workouts.    Education and routine counseling performed. Handouts provided    Expresses verbal understanding and consents to current therapy and treatment regimen.  No barriers to understanding were identified.  Red flag symptoms and signs discussed in detail.  Patient expressed understanding regarding what to do in case of emergency\urgent symptoms   Please see AVS handed out to patient at the end of our visit for further patient instructions/ counseling done pertaining to today's office visit.   Return if symptoms worsen or fail to improve, for f/up chronic care as discussed prior.     Note:  This document was prepared occasionally using Dragon voice recognition software and may include unintentional dictation errors in addition to  a scribe.  This document serves as a record of services personally performed by Mellody Dance, DO. It was created on her behalf by Toni Amend, a trained medical scribe. The creation of this record is based on the scribe's personal observations and the provider's statements to them.   I have reviewed the above medical documentation for accuracy and completeness and I concur.  Mellody Dance, DO 05/09/2018 11:50 AM      -------------------------------------------------------------------------------------------------------------------------------------    Subjective:    CC:  Chief Complaint  Patient presents with  . Cyst    HPI: Elta Angell is a 72 y.o. female who presents to [place] at [facility] today for issues as discussed below.  Friday night (6 days ago), felt a "lump in her stomach" on the way home from dinner.  Notes she was laughing and felt her stomach.  At onset, "it was really big and hard," and there was pain when she palpated it.  States today "it's not as big as it was and it's not as sore."  Prior, "I couldn't even touch there, it was sore."  Denies nausea, vomiting, notes her stools are passing normally, "pretty much."  Denies fever or chills.  Has never had this before.  Denies experiencing lipomas or other growths beneath the skin in the past.  She has been working out, doing aerobics, "trying to stay healthy."    Notes that she's been doing more abdominal workouts in particular.  She has been going to the gym and working out at least three times per week, and particularly focusing on  the elliptical machine.   No problems updated.   Wt Readings from Last 3 Encounters:  05/09/18 160 lb 6.4 oz (72.8 kg)  04/09/18 160 lb 3.2 oz (72.7 kg)  12/19/17 163 lb 9.6 oz (74.2 kg)   BP Readings from Last 3 Encounters:  05/09/18 132/84  04/09/18 112/77  12/19/17 130/80   BMI Readings from Last 3 Encounters:  05/09/18 28.41 kg/m    04/09/18 28.38 kg/m  12/19/17 28.98 kg/m     Patient Care Team    Relationship Specialty Notifications Start End  Mellody Dance, DO PCP - General Family Medicine  07/04/17   Jamal Maes, MD Consulting Physician Nephrology  07/04/17      Patient Active Problem List   Diagnosis Date Noted  . Vitamin D deficiency 07/16/2017  . Diabetes mellitus without complication ([place]) 27/25/3664  . Hypertension associated with diabetes ([place]) 07/04/2017  . 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 Microalbuminuria due to type 2 diabetes mellitus ([place]) 07/04/2017  . Mixed diabetic hyperlipidemia associated with type 2 diabetes mellitus ([place]) 07/04/2017  . Mood disorder ([place]) 07/04/2017  . 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 Depression   . Diabetes mellitus without complication ([place])   . Hyperlipidemia   . Hypertension   . 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 Intial UPC 4.7 grams. Conservative Rx. 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 Lorrene Reid)     History reviewed. No pertinent surgical history.   History reviewed. No pertinent family history.   Social History   Socioeconomic History  . Marital status: Divorced    Spouse name: Not on file  . Number of children: Not on file  . Years of education: Not on file  . Highest education level: Not on file  Occupational History  . Not on file  Social Needs  . Financial resource strain: Not on file  . Food insecurity:    Worry: Not on file    Inability: Not on file  . Transportation needs:    Medical: Not on file    Non-medical: Not on file  Tobacco Use  . Smoking status: Never Smoker  . Smokeless tobacco: Never Used  Substance and Sexual Activity  . Alcohol use: Yes    Comment: social  . Drug use: No  . Sexual  activity: Never    Birth control/protection: None  Lifestyle  . Physical activity:    Days per week: Not on file    Minutes per session: Not on file  . Stress: Not on file  Relationships  . Social connections:    Talks on phone: Not on file    Gets together: Not on file    Attends religious service: Not on file    Active member of club or organization: Not on file    Attends meetings of clubs or organizations: Not on file    Relationship status: Not on file  . Intimate partner violence:    Fear of current or ex partner: Not on file    Emotionally abused: Not on file    Physically abused: Not on file    Forced sexual activity: Not on file  Other Topics Concern  . Not on file  Social History Narrative  . Not on file     Current Meds  Medication Sig  .  atorvastatin (LIPITOR) 20 MG tablet Take 1 tablet (20 mg total) by mouth at bedtime.  . Calcium Carbonate-Vit D-Min (CALCIUM 1200 PO) Take 1 tablet by mouth daily.  . Cholecalciferol (VITAMIN D-3) 5000 units TABS Take 1 tablet by mouth daily.  . citalopram (CELEXA) 20 MG tablet Take 0.5 tablets (10 mg total) by mouth daily.  Marland Kitchen glucose blood test strip Use to check fasting glucose in the AM and to check 2 hours after largest meal of the day.  . losartan (COZAAR) 100 MG tablet Take 1 tablet (100 mg total) by mouth daily.  . metFORMIN (GLUCOPHAGE-XR) 500 MG 24 hr tablet Take 2 tablets (1,000 mg total) by mouth daily after supper.  . ONE TOUCH LANCETS MISC Use to check glucose levels fasting in the AM and 2 hours after largest meal.  Patient requested One Touch Verio Flex lancets.    Allergies:  Allergies  Allergen Reactions  . Penicillins Hives     Review of Systems: General:   Denies fever, chills, unexplained weight loss.  Optho/Auditory:   Denies visual changes, blurred vision/LOV Respiratory:   Denies wheeze, DOE more than baseline levels.   Cardiovascular:   Denies chest pain, palpitations, new onset peripheral edema    Gastrointestinal:   Denies nausea, vomiting, diarrhea, abd pain.  Genitourinary: Denies dysuria, freq/ urgency, flank pain or discharge from genitals.  Endocrine:     Denies hot or cold intolerance, polyuria, polydipsia. Musculoskeletal:   Denies unexplained myalgias, joint swelling, unexplained arthralgias, gait problems.  Skin:  Denies new onset rash, suspicious lesions Neurological:     Denies dizziness, unexplained weakness, numbness  Psychiatric/Behavioral:   Denies mood changes, suicidal or homicidal ideations, hallucinations    Objective:   Blood pressure 132/84, pulse 61, temperature 97.6 F (36.4 C), height 5\' 3"  (1.6 m), weight 160 lb 6.4 oz (72.8 kg), SpO2 100 %. Body mass index is 28.41 kg/m. General:  Well Developed, well nourished, appropriate for stated age.  Neuro:  Alert and oriented,  extra-ocular muscles intact  HEENT:  Normocephalic, atraumatic, neck supple Skin:  no gross rash, warm, pink. Cardiac:  RRR, S1 S2 Respiratory:  ECTA B/L and A/P, Not using accessory muscles, speaking in full sentences- unlabored. Vascular:  Ext warm, no cyanosis apprec.; cap RF less 2 sec. Psych:  No HI/SI, judgement and insight good, Euthymic mood. Full Affect. Abdominal: Soft, non-tender, bowel sounds active all four quadrants, NO   G/R/R, no masses, no organomegaly.

## 2018-05-28 ENCOUNTER — Other Ambulatory Visit: Payer: Self-pay | Admitting: Family Medicine

## 2018-05-28 DIAGNOSIS — E1169 Type 2 diabetes mellitus with other specified complication: Secondary | ICD-10-CM

## 2018-05-28 DIAGNOSIS — E782 Mixed hyperlipidemia: Principal | ICD-10-CM

## 2018-06-06 ENCOUNTER — Other Ambulatory Visit: Payer: Self-pay

## 2018-06-06 DIAGNOSIS — E119 Type 2 diabetes mellitus without complications: Secondary | ICD-10-CM

## 2018-06-06 DIAGNOSIS — E1169 Type 2 diabetes mellitus with other specified complication: Secondary | ICD-10-CM

## 2018-06-06 DIAGNOSIS — E782 Mixed hyperlipidemia: Principal | ICD-10-CM

## 2018-06-06 DIAGNOSIS — F39 Unspecified mood [affective] disorder: Secondary | ICD-10-CM

## 2018-06-06 MED ORDER — ATORVASTATIN CALCIUM 20 MG PO TABS: ORAL_TABLET | ORAL | 1 refills | Status: DC

## 2018-06-06 MED ORDER — CITALOPRAM HYDROBROMIDE 20 MG PO TABS: 10.0000 mg | ORAL_TABLET | Freq: Every day | ORAL | 1 refills | Status: DC

## 2018-06-06 MED ORDER — METFORMIN HCL ER 500 MG PO TB24: 1000.0000 mg | ORAL_TABLET | Freq: Every day | ORAL | 1 refills | Status: DC

## 2018-06-06 NOTE — Telephone Encounter (Signed)
Pharmacy sent refill request for atorvastatin, reviewed chart and sent in refill per office policy. MPulliam, CMA/RT(R)

## 2018-06-09 ENCOUNTER — Other Ambulatory Visit: Payer: Self-pay | Admitting: Family Medicine

## 2018-06-09 DIAGNOSIS — I152 Hypertension secondary to endocrine disorders: Secondary | ICD-10-CM

## 2018-06-09 DIAGNOSIS — E1159 Type 2 diabetes mellitus with other circulatory complications: Secondary | ICD-10-CM

## 2018-06-09 DIAGNOSIS — I10 Essential (primary) hypertension: Principal | ICD-10-CM

## 2018-06-10 ENCOUNTER — Other Ambulatory Visit: Payer: Self-pay

## 2018-06-10 DIAGNOSIS — I10 Essential (primary) hypertension: Secondary | ICD-10-CM

## 2018-06-10 DIAGNOSIS — I152 Hypertension secondary to endocrine disorders: Secondary | ICD-10-CM

## 2018-06-10 DIAGNOSIS — F39 Unspecified mood [affective] disorder: Secondary | ICD-10-CM

## 2018-06-10 DIAGNOSIS — E782 Mixed hyperlipidemia: Principal | ICD-10-CM

## 2018-06-10 DIAGNOSIS — E1159 Type 2 diabetes mellitus with other circulatory complications: Secondary | ICD-10-CM

## 2018-06-10 DIAGNOSIS — E119 Type 2 diabetes mellitus without complications: Secondary | ICD-10-CM

## 2018-06-10 DIAGNOSIS — E1169 Type 2 diabetes mellitus with other specified complication: Secondary | ICD-10-CM

## 2018-06-10 MED ORDER — CITALOPRAM HYDROBROMIDE 20 MG PO TABS: 10.0000 mg | ORAL_TABLET | Freq: Every day | ORAL | 1 refills | Status: DC

## 2018-06-10 MED ORDER — LOSARTAN POTASSIUM 100 MG PO TABS: 100.0000 mg | ORAL_TABLET | Freq: Every day | ORAL | 1 refills | Status: DC

## 2018-06-10 MED ORDER — ATORVASTATIN CALCIUM 20 MG PO TABS: ORAL_TABLET | ORAL | 1 refills | Status: DC

## 2018-06-10 MED ORDER — METFORMIN HCL ER 500 MG PO TB24: 1000.0000 mg | ORAL_TABLET | Freq: Every day | ORAL | 1 refills | Status: DC

## 2018-06-10 NOTE — Telephone Encounter (Signed)
Patient is now using [facility], new RXs sent in to the new pharmacy. MPulliam, CMA/RT(R)

## 2018-06-12 ENCOUNTER — Telehealth: Payer: Self-pay | Admitting: Family Medicine

## 2018-06-12 NOTE — Telephone Encounter (Signed)
Patient called requesting to speak with Melissa specifically for an unspecified reason. She said please contact her at your earliest availability.

## 2018-06-12 NOTE — Telephone Encounter (Signed)
Called patient and left message for patient to call the office back. MPulliam, CMA/RT(R)  

## 2018-06-14 NOTE — Telephone Encounter (Signed)
Called patient left message to call the office. MPulliam, CMA/RT(R)  

## 2018-06-14 NOTE — Telephone Encounter (Signed)
Spoke to patient she states that blood pressure is reading high in the mornings and then comes down through the day.  Patient states that she takes her medication in the morning for BP and then takes her reading about 10 mins later.  Patient has also been working out in the mornings.  Patient denies any symptoms such as chest pains, SOB, dizziness or headaches.  Advised the patient to stop the work outs for the next two days continue to monitor BP if it remains high during the day, if she develops red flag symptoms or if readings are > then 180/120 to got to the hospital ASAP.  Patient expressed understanding and will follow up with by phone on Monday. MPulliam, CMA/RT(R)

## 2018-06-17 ENCOUNTER — Other Ambulatory Visit: Payer: Self-pay

## 2018-06-17 DIAGNOSIS — E119 Type 2 diabetes mellitus without complications: Secondary | ICD-10-CM

## 2018-06-17 MED ORDER — ONETOUCH VERIO W/DEVICE KIT: PACK | 0 refills | Status: DC

## 2018-06-17 NOTE — Telephone Encounter (Signed)
Called patient left message to call the office. MPulliam, CMA/RT(R)  

## 2018-06-18 NOTE — Telephone Encounter (Signed)
Per Dr. Raliegh Scarlet if patient is to check BP once daily in the afternoon, after medication has time to get in her symptoms.  If patient develops any symptoms that are concerning patient is to follow up in the office, any red flag symptoms patient is to go to the ER.  Patient is to keep her follow up with [place] in March, unless she needs to come in sooner.  Called the patient to notify, left message for patient to call the office back. MPulliam, CMA/RT(R)

## 2018-06-19 NOTE — Telephone Encounter (Signed)
Patient called and was notified of the information in messages below. MPulliam, CMA/RT(R)

## 2018-07-22 ENCOUNTER — Other Ambulatory Visit: Payer: Self-pay

## 2018-07-22 DIAGNOSIS — E119 Type 2 diabetes mellitus without complications: Secondary | ICD-10-CM

## 2018-07-22 MED ORDER — ONETOUCH DELICA PLUS LANCET33G MISC: 2.0000 | Freq: Every day | 5 refills | Status: DC

## 2018-07-22 NOTE — Progress Notes (Signed)
Refill on lancets. MPulliam, CMA/RT(R)

## 2018-07-29 ENCOUNTER — Other Ambulatory Visit: Payer: Self-pay

## 2018-07-29 DIAGNOSIS — F39 Unspecified mood [affective] disorder: Secondary | ICD-10-CM

## 2018-07-29 MED ORDER — CITALOPRAM HYDROBROMIDE 20 MG PO TABS: 10.0000 mg | ORAL_TABLET | Freq: Every day | ORAL | 1 refills | Status: DC

## 2018-08-05 ENCOUNTER — Other Ambulatory Visit: Payer: Self-pay

## 2018-08-05 ENCOUNTER — Other Ambulatory Visit (INDEPENDENT_AMBULATORY_CARE_PROVIDER_SITE_OTHER): Payer: Medicare Other

## 2018-08-05 DIAGNOSIS — E1159 Type 2 diabetes mellitus with other circulatory complications: Secondary | ICD-10-CM | POA: Diagnosis not present

## 2018-08-05 DIAGNOSIS — E1169 Type 2 diabetes mellitus with other specified complication: Secondary | ICD-10-CM | POA: Diagnosis not present

## 2018-08-05 DIAGNOSIS — E559 Vitamin D deficiency, unspecified: Secondary | ICD-10-CM

## 2018-08-05 DIAGNOSIS — E782 Mixed hyperlipidemia: Secondary | ICD-10-CM

## 2018-08-05 DIAGNOSIS — E2839 Other primary ovarian failure: Secondary | ICD-10-CM

## 2018-08-05 DIAGNOSIS — I10 Essential (primary) hypertension: Secondary | ICD-10-CM | POA: Diagnosis not present

## 2018-08-05 DIAGNOSIS — F39 Unspecified mood [affective] disorder: Secondary | ICD-10-CM

## 2018-08-05 DIAGNOSIS — I152 Hypertension secondary to endocrine disorders: Secondary | ICD-10-CM

## 2018-08-05 DIAGNOSIS — E119 Type 2 diabetes mellitus without complications: Secondary | ICD-10-CM | POA: Diagnosis not present

## 2018-08-05 DIAGNOSIS — Z23 Encounter for immunization: Secondary | ICD-10-CM

## 2018-08-06 LAB — COMPREHENSIVE METABOLIC PANEL
ALT: 14 IU/L (ref 0–32)
AST: 19 IU/L (ref 0–40)
Albumin/Globulin Ratio: 1.9 (ref 1.2–2.2)
Albumin: 4.8 g/dL — ABNORMAL HIGH (ref 3.7–4.7)
Alkaline Phosphatase: 78 IU/L (ref 39–117)
BUN/Creatinine Ratio: 24 (ref 12–28)
BUN: 21 mg/dL (ref 8–27)
Bilirubin Total: 1.1 mg/dL (ref 0.0–1.2)
CO2: 24 mmol/L (ref 20–29)
Calcium: 9.8 mg/dL (ref 8.7–10.3)
Chloride: 105 mmol/L (ref 96–106)
Creatinine, Ser: 0.88 mg/dL (ref 0.57–1.00)
GFR calc Af Amer: 75 mL/min/{1.73_m2} (ref >59)
GFR calc non Af Amer: 65 mL/min/{1.73_m2} (ref >59)
Globulin, Total: 2.5 g/dL (ref 1.5–4.5)
Glucose: 118 mg/dL — ABNORMAL HIGH (ref 65–99)
Potassium: 4.7 mmol/L (ref 3.5–5.2)
Sodium: 144 mmol/L (ref 134–144)
TOTAL PROTEIN: 7.3 g/dL (ref 6.0–8.5)

## 2018-08-06 LAB — CBC WITH DIFFERENTIAL/PLATELET
BASOS ABS: 0 10*3/uL (ref 0.0–0.2)
Basos: 1 %
EOS (ABSOLUTE): 0.2 10*3/uL (ref 0.0–0.4)
Eos: 4 %
HEMOGLOBIN: 13.5 g/dL (ref 11.1–15.9)
Hematocrit: 40.7 % (ref 34.0–46.6)
Immature Grans (Abs): 0 10*3/uL (ref 0.0–0.1)
Immature Granulocytes: 0 %
Lymphocytes Absolute: 2.2 10*3/uL (ref 0.7–3.1)
Lymphs: 37 %
MCH: 29.3 pg (ref 26.6–33.0)
MCHC: 33.2 g/dL (ref 31.5–35.7)
MCV: 89 fL (ref 79–97)
MONOCYTES: 6 %
Monocytes Absolute: 0.4 10*3/uL (ref 0.1–0.9)
Neutrophils Absolute: 3.1 10*3/uL (ref 1.4–7.0)
Neutrophils: 52 %
Platelets: 276 10*3/uL (ref 150–450)
RBC: 4.6 x10E6/uL (ref 3.77–5.28)
RDW: 13.1 % (ref 11.7–15.4)
WBC: 6 10*3/uL (ref 3.4–10.8)

## 2018-08-06 LAB — HEMOGLOBIN A1C
Est. average glucose Bld gHb Est-mCnc: 134 mg/dL
Hgb A1c MFr Bld: 6.3 % — ABNORMAL HIGH (ref 4.8–5.6)

## 2018-08-06 LAB — LIPID PANEL
Chol/HDL Ratio: 2.6 ratio (ref 0.0–4.4)
Cholesterol, Total: 136 mg/dL (ref 100–199)
HDL: 52 mg/dL (ref >39)
LDL CALC: 62 mg/dL (ref 0–99)
Triglycerides: 108 mg/dL (ref 0–149)
VLDL Cholesterol Cal: 22 mg/dL (ref 5–40)

## 2018-08-06 LAB — VITAMIN B12: Vitamin B-12: 755 pg/mL (ref 232–1245)

## 2018-08-06 LAB — VITAMIN D 25 HYDROXY (VIT D DEFICIENCY, FRACTURES): Vit D, 25-Hydroxy: 65.7 ng/mL (ref 30.0–100.0)

## 2018-08-06 LAB — TSH: TSH: 1.6 u[IU]/mL (ref 0.450–4.500)

## 2018-08-06 LAB — T4, FREE: Free T4: 1.11 ng/dL (ref 0.82–1.77)

## 2018-08-08 ENCOUNTER — Ambulatory Visit: Payer: Medicare HMO | Admitting: Family Medicine

## 2018-08-15 ENCOUNTER — Other Ambulatory Visit: Payer: Self-pay

## 2018-08-15 ENCOUNTER — Ambulatory Visit (INDEPENDENT_AMBULATORY_CARE_PROVIDER_SITE_OTHER): Payer: Medicare Other | Admitting: Family Medicine

## 2018-08-15 ENCOUNTER — Encounter: Payer: Self-pay | Admitting: Family Medicine

## 2018-08-15 VITALS — BP 148/88 | HR 69 | Wt 155.0 lb

## 2018-08-15 DIAGNOSIS — E1169 Type 2 diabetes mellitus with other specified complication: Secondary | ICD-10-CM

## 2018-08-15 DIAGNOSIS — I10 Essential (primary) hypertension: Secondary | ICD-10-CM | POA: Diagnosis not present

## 2018-08-15 DIAGNOSIS — E1159 Type 2 diabetes mellitus with other circulatory complications: Secondary | ICD-10-CM

## 2018-08-15 DIAGNOSIS — F39 Unspecified mood [affective] disorder: Secondary | ICD-10-CM

## 2018-08-15 DIAGNOSIS — E782 Mixed hyperlipidemia: Secondary | ICD-10-CM

## 2018-08-15 DIAGNOSIS — E119 Type 2 diabetes mellitus without complications: Secondary | ICD-10-CM

## 2018-08-15 DIAGNOSIS — I152 Hypertension secondary to endocrine disorders: Secondary | ICD-10-CM

## 2018-08-15 MED ORDER — AMLODIPINE BESYLATE 2.5 MG PO TABS: 2.5000 mg | ORAL_TABLET | Freq: Every day | ORAL | 1 refills | Status: DC

## 2018-08-15 NOTE — Progress Notes (Signed)
.      Virtual Visit via Telephone Note for Mellody Dance, D.O- Primary Care Physician at [facility]   I connected with current patient today by telephone and verified that I am speaking with the correct person using two identifiers.   I discussed the limitations, risks, security and privacy concerns of performing an evaluation and management service by telephone and the limited availability of in person appointments during this current national crisis of the Covid-19 pandemic.  My staff members also discussed with the patient that there may be a patient responsible charge related to this service.  The patient expressed understanding and agreed to proceed.     History of Present Illness:  HPI:  Patient last labs were done 08/05/2018.  She is here today to discuss these.    DM HPI:  Yet 4 months ago A1c was 6.6 and now 10 days ago it went down to 6.3.   FBS: under 130's highest was 150; lowest was 110 consistently  -  She has been working on diet and exercise for diabetes- was going to the gym , eating more fruits and veggies;  Not going to the gym now.   Pt is currently maintained on the following medications for diabetes: Metformin  Medication compliance - ok- if doesn't eat, doesn't take them   Denies polyuria/polydipsia.  Denies hypo/ hyperglycemia symptoms  Last diabetic eye exam was  Lab Results  Component Value Date   HMDIABEYEEXA No Retinopathy 01/14/2018   HMDIABEYEEXA No Retinopathy 01/14/2018    Foot exam- UTD  Last A1C in the office was:  Lab Results  Component Value Date   HGBA1C 6.3 (H) 08/05/2018   HGBA1C 6.6 (A) 04/09/2018   HGBA1C 6.4 12/19/2017    Lab Results  Component Value Date   MICROALBUR 80 07/09/2017   LDLCALC 62 08/05/2018   CREATININE 0.88 08/05/2018    Wt Readings from Last 3 Encounters:  08/15/18 155 lb (70.3 kg)  05/09/18 160 lb 6.4 oz (72.8 kg)  04/09/18 160 lb 3.2 oz (72.7 kg)    BP Readings from Last 3 Encounters:   08/15/18 (!) 148/88  05/09/18 132/84  04/09/18 112/77     2.    VIt D def:  - taking 5k iu qd -Now in the 59s.  This was much improved since last time we checked it was less than 30.    3    HTN HPI:  -  Her blood pressure has not been controlled at home.  Pt checking it at home daily and her lowest blood pressure is 108/70 and highest is 183/81.  Per patient she normally runs around 793J systolically.  - Patient reports good compliance with blood pressure medications  - Denies medication S-E   - Smoking Status noted   - She denies new onset of: chest pain, exercise intolerance, shortness of breath, dizziness, visual changes, headache, lower extremity swelling or claudication.   Today their BP is BP: (!) 148/88   Last 3 blood pressure readings in our office are as follows: BP Readings from Last 3 Encounters:  08/15/18 (!) 148/88  05/09/18 132/84  04/09/18 112/77    Filed Weights   08/15/18 1350  Weight: 155 lb (70.3 kg)      4.   CHOL HPI:   -  She  is currently managed with: Lipitor 20 mg nightly.  Txmnt compliance- she takes her medicine most evenings.  Patient reports very little compliance with low chol/ saturated and trans fat  diet.  No exercise  RUQ pain- no  Muscle aches- no  No other s-e  Last lipid panel as follows:  Lab Results  Component Value Date   CHOL 136 08/05/2018   HDL 52 08/05/2018   LDLCALC 62 08/05/2018   TRIG 108 08/05/2018   CHOLHDL 2.6 08/05/2018     Hepatic Function Latest Ref Rng & Units 08/05/2018 07/12/2017 06/10/2010  Total Protein 6.0 - 8.5 g/dL 7.3 7.4 7.0  Albumin 3.7 - 4.7 g/dL 4.8(H) 4.6 4.3  AST 0 - 40 IU/L 19 22 34  ALT 0 - 32 IU/L '14 27 28  ' Alk Phosphatase 39 - 117 IU/L 78 66 52  Total Bilirubin 0.0 - 1.2 mg/dL 1.1 0.7 0.7        Wt Readings from Last 3 Encounters:  08/15/18 155 lb (70.3 kg)  05/09/18 160 lb 6.4 oz (72.8 kg)  04/09/18 160 lb 3.2 oz (72.7 kg)   BP Readings from Last 3 Encounters:   08/15/18 (!) 148/88  05/09/18 132/84  04/09/18 112/77   Pulse Readings from Last 3 Encounters:  08/15/18 69  05/09/18 61  04/09/18 73   BMI Readings from Last 3 Encounters:  08/15/18 27.46 kg/m  05/09/18 28.41 kg/m  04/09/18 28.38 kg/m     Patient Care Team    Relationship Specialty Notifications Start End  Mellody Dance, DO PCP - General Family Medicine  07/04/17   Jamal Maes, MD Consulting Physician Nephrology  07/04/17      Patient Active Problem List   Diagnosis Date Noted  . Vitamin D deficiency 07/16/2017  . Diabetes mellitus without complication ([place]) 71/21/9758  . Hypertension associated with diabetes ([place]) 07/04/2017  . 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 Microalbuminuria due to type 2 diabetes mellitus ([place]) 07/04/2017  . Mixed diabetic hyperlipidemia associated with type 2 diabetes mellitus ([place]) 07/04/2017  . Mood disorder ([place]) 07/04/2017  . 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     Allergies:  Allergies  Allergen Reactions  . Penicillins Hives     ROS:  See above HPI for pertinent positives and negatives   Objective:   Blood pressure (!) 148/88, pulse 69, weight 155 lb (70.3 kg). Body mass index is 27.46 kg/m.   General: sounds in no acute distress.  Skin: Pt confirms warm and dry  extremities and pink fingertips Respiratory: speaking in full sentences, no conversational dyspnea Psych: A and O *3, appears insight good, mood- full       Impression and Recommendations:     1. Diabetes mellitus without complication ([place]) Patient's diabetes is well controlled at this time.  - Counseled patient on pathophysiology of disease and discussed various treatment options, which often  includes dietary and lifestyle modifications as first line.  Importance of low carb diet discussed with patient in addition to regular exercise.   - Check FBS and 2 hours after the biggest meal of your day.  Keep log and bring in next OV for my review.   Also, if you ever feel poorly, please check your blood pressure and blood sugar, as one or the other could be the cause of your symptoms.  - Being a diabetic, you need yearly eye and foot exams. Make appt.for diabetic eye exam   - Continue current medication(s).  2. Hypertension associated with diabetes ([place]) Above goal being a diabetic.  We will add to her losartan 100 mg daily a low dose amlodipine of 2.5 mg daily. - Since patient thinks her blood pressures are always high in the morning we will have her take this additional blood pressure medicine at night.  3. Mixed diabetic hyperlipidemia associated with type 2 diabetes mellitus ([place]) Patient's LDL is at goal for her diabetes. -Triglycerides are well controlled as well.  4. Mood disorder ([place]) Patient has been worried about covid-19 and had multiple questions about how she can improve her immune system. -She had questions about various supplements. -I reiterated to her that while supplements can be useful such as vitamin C, Echinacea etc., there is no substitute to healthy diet and lifestyle.  I recommend she get proper sleep, a diet high in antioxidants which is lots of fruits and veggies, exercise an hour a day and control her chronic conditions as best she can.  -  Also discussed controlling mood backslash stress levels.  Patient states she has been doing very well with that considering  I discussed the assessment and treatment plan with the patient. The patient was provided an opportunity to ask questions and all were answered. The patient agreed with the plan and demonstrated an understanding of the instructions.   The patient was advised to call back or seek an in-person  evaluation if the symptoms worsen or if the condition fails to improve as anticipated.   --> Patient will follow-up in 6 weeks after starting new medication amlodipine at 2.5 mg nightly.    Meds ordered this encounter  Medications  . amLODipine (NORVASC) 2.5 MG tablet    Sig: Take 1 tablet (2.5 mg total) by mouth at bedtime.    Dispense:  90 tablet    Refill:  1     Gross side effects, risk and benefits, and alternatives of medications and treatment plan in general discussed with patient.  Patient is aware that all medications have potential side effects and we are unable to predict every side effect or drug-drug interaction that may occur.   Patient was strongly encouraged to call with any questions or concerns they may have concerns.    Expresses verbal understanding and consents to current therapy and treatment regimen.  No barriers to understanding were identified.  Red flag symptoms and signs discussed in detail.  Patient expressed understanding regarding what to do in case of emergency\urgent symptoms  Please see AVS handed out to patient at the end of our visit for further patient instructions/ counseling done pertaining to today's office visit.  I provided 22 minutes 37 seconds of non-face-to-face time during this encounter.     Mellody Dance, DO

## 2018-09-26 ENCOUNTER — Other Ambulatory Visit: Payer: Self-pay

## 2018-09-26 ENCOUNTER — Encounter: Payer: Self-pay | Admitting: Family Medicine

## 2018-09-26 ENCOUNTER — Ambulatory Visit (INDEPENDENT_AMBULATORY_CARE_PROVIDER_SITE_OTHER): Payer: Medicare Other | Admitting: Family Medicine

## 2018-09-26 VITALS — BP 139/89 | HR 57

## 2018-09-26 DIAGNOSIS — I10 Essential (primary) hypertension: Secondary | ICD-10-CM | POA: Diagnosis not present

## 2018-09-26 DIAGNOSIS — E1159 Type 2 diabetes mellitus with other circulatory complications: Secondary | ICD-10-CM

## 2018-09-26 DIAGNOSIS — I152 Hypertension secondary to endocrine disorders: Secondary | ICD-10-CM

## 2018-09-26 MED ORDER — AMLODIPINE BESYLATE 5 MG PO TABS: 5.0000 mg | ORAL_TABLET | Freq: Every day | ORAL | 0 refills | Status: DC

## 2018-09-26 NOTE — Progress Notes (Signed)
Telehealth office visit note for Mellody Dance, D.O- at Primary Care at [facility]   I connected with current patient today and verified that I am speaking with the correct person using two identifiers.   . Location of the patient: Home . Location of the provider: Office Only the patient (+/- their family members at pt's discretion) and myself were participating in the encounter    - This visit type was conducted due to national recommendations for restrictions regarding the COVID-19 Pandemic (e.g. social distancing) in an effort to limit this patient's exposure and mitigate transmission in our community.  This format is felt to be most appropriate for this patient at this time.   - The patient did not have access to video technology or had technical difficulties with video requiring transitioning to audio format only. - No physical exam could be performed with this format, beyond that communicated to [place] by the patient/ family members as noted.   - Additionally my office staff/ schedulers discussed with the patient that there may be a monetary charge related to this service, depending on their medical insurance.   The patient expressed understanding, and agreed to proceed.       History of Present Illness: Seen 08/15/18- started new BP med -  tol well - no S-E bp at home running now 128/83, 144/93, 152/91, 146/90, 152/90 Last two weeks: 139/92, 148/95, 131/79, 144/89, 155/88, 135/85, 120/86 this am 139/89  FBS- 96-110.  Last A1c was under 6.5 mid March.  She is doing extremely well with diet and exercise and motivated to get healthier/get off some meds.   Mood:   Pt inc dose to a whole pill recently- doing better than on 10 mg.  Sleeping okay not great but better since she increased the dose.  She denies any need for change in that medicine or for sleep aid etc.    Impression and Recommendations:    1. Hypertension associated with diabetes ([place])     -If A1c upon recheck is  still under 6.5, we will look to decrease the doses of her diabetes medicines-metformin.  -For blood pressure we increase amlodipine from 2.5 to 5 mg today.  She will keep a blood pressure log of checking it daily and follow-up in 6 weeks or so to see how she is doing on the new dose.  Told her goal is to get most blood pressures in the 140/90 or less range without any side effects, lightheadedness etc.  -Encouraged patient to get out and start exercising again like she had in the past as well.  - As part of my medical decision making, I reviewed the following data within the [place] History obtained from pt /family, CMA notes reviewed and incorporated if applicable, Labs reviewed, Radiograph/ tests reviewed if applicable and OV notes from prior OV's with me, as well as other specialists she/he has seen since seeing me last, were all reviewed and used in my medical decision making process today.   - Additionally, discussion had with patient regarding txmnt plan, and their biases/concerns about that plan were used in my medical decision making today.   - The patient agreed with the plan and demonstrated an understanding of the instructions.   No barriers to understanding were identified.   - Red flag symptoms and signs discussed in detail.  Patient expressed understanding regarding what to do in case of emergency\ urgent symptoms.  The patient was advised to call  back or seek an in-person evaluation if the symptoms worsen or if the condition fails to improve as anticipated.   Return for Late June/early July-office visit for increase amlodipine dose and recheck A1c.    Meds ordered this encounter  Medications  . amLODipine (NORVASC) 5 MG tablet    Sig: Take 1 tablet (5 mg total) by mouth at bedtime.    Dispense:  90 tablet    Refill:  0    Medications Discontinued During This Encounter  Medication Reason  . citalopram (CELEXA) 20 MG tablet Dose change  . amLODipine  (NORVASC) 2.5 MG tablet Reorder      I provided 18 minutes of non-face-to-face time during this encounter,with over 50% of the time in direct counseling on patients medical conditions/ medical concerns.  Additional time was spent with charting and coordination of care after the actual visit commenced.   Note:  This note was prepared with assistance of Dragon voice recognition software. Occasional wrong-word or sound-a-like substitutions may have occurred due to the inherent limitations of voice recognition software.  Mellody Dance, DO    -Vitals obtained; medications/ allergies reconciled;  personal medical, social, Sx etc.histories were updated by CMA, reviewed by me and are reflected in chart   Patient Care Team    Relationship Specialty Notifications Start End  Mellody Dance, DO PCP - General Family Medicine  07/04/17   Jamal Maes, MD Consulting Physician Nephrology  07/04/17      Patient Active Problem List   Diagnosis Date Noted  . Vitamin D deficiency 07/16/2017  . Diabetes mellitus without complication ([place]) 92/42/6834  . Hypertension associated with diabetes ([place]) 07/04/2017  . 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 Microalbuminuria due to type 2 diabetes mellitus ([place]) 07/04/2017  . 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 Mood disorder ([place]) 07/04/2017  . 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 amLODipine (NORVASC) 5 MG tablet Take 1 tablet (5 mg total) by mouth at bedtime.  Marland Kitchen atorvastatin (LIPITOR) 20 MG tablet TAKE 1 TABLET BY MOUTH EVERYDAY AT BEDTIME  . Blood Glucose Monitoring Suppl (ONETOUCH VERIO) w/Device KIT Use to test glucose fating in AM and 2 hours after largest meal  . Calcium Carbonate-Vit D-Min (CALCIUM 1200 PO) Take 1 tablet by mouth daily.   . Cholecalciferol (VITAMIN D-3) 5000 units TABS Take 1 tablet by mouth daily.  . citalopram (CELEXA) 20 MG tablet Take 20 mg by mouth daily.  . Lancets (ONETOUCH DELICA PLUS [id number]) MISC 2 each by Does not apply route daily.  Marland Kitchen losartan (COZAAR) 100 MG tablet Take 1 tablet (100 mg total) by mouth daily.  . metFORMIN (GLUCOPHAGE-XR) 500 MG 24 hr tablet Take 2 tablets (1,000 mg total) by mouth daily after supper.  . [DISCONTINUED] amLODipine (NORVASC) 2.5 MG tablet Take 1 tablet (2.5 mg total) by mouth at bedtime.  . [DISCONTINUED] glucose blood test strip Use to check fasting glucose in the AM and to check 2 hours after largest meal of the day.     Allergies:  Allergies  Allergen Reactions  . Penicillins Hives     ROS:  See above HPI for pertinent positives and negatives   Objective:   Blood pressure 139/89, pulse (!) 57.  (if some vitals are omitted, this means that patient was UNABLE to obtain them even  though they were asked to get them prior to OV today.  They were asked to call us at their earliest convenience with these once obtained. )  General: A & O * 3; sounds in no acute distress; in usual state of health.  Skin: Pt confirms warm and dry extremities and pink fingertips HEENT: Pt confirms lips non-cyanotic Chest: Patient confirms normal chest excursion and movement Respiratory: speaking in full sentences, no conversational dyspnea; patient confirms no use of accessory muscles Psych: insight appears good, mood- appears full

## 2018-10-02 ENCOUNTER — Other Ambulatory Visit: Payer: Self-pay | Admitting: Family Medicine

## 2018-10-02 DIAGNOSIS — E119 Type 2 diabetes mellitus without complications: Secondary | ICD-10-CM

## 2018-11-14 ENCOUNTER — Other Ambulatory Visit: Payer: Self-pay | Admitting: Family Medicine

## 2018-11-14 DIAGNOSIS — E119 Type 2 diabetes mellitus without complications: Secondary | ICD-10-CM

## 2018-12-17 ENCOUNTER — Other Ambulatory Visit: Payer: Self-pay

## 2018-12-17 NOTE — Patient Outreach (Signed)
[name]) Care Management  12/17/2018  Rachel Camacho Aug 22, 1945 [id number]   Medication Adherence call to Mrs.  Jnyah Camacho HIPPA Compliant Voice message left with a call back number. Rachel Camacho is showing past due on Metformin Er 500 mg and Losartan 100 mg under [facility] ins.   Rachel Camacho Management Direct Dial [phone]  Fax [phone] [email or web address]'

## 2018-12-18 ENCOUNTER — Other Ambulatory Visit: Payer: Self-pay

## 2018-12-18 NOTE — Patient Outreach (Signed)
[facility]) Care Management  12/18/2018  Rachel Camacho Oct 19, 1945 [id number]   Medication Adherence call back from Rachel Camacho [place] Identifiers Verify patient explain  She is taking Metformin Er 500 mg patient ask if we can call [facility] an order this medication [facility] will mail out with in 5-7 days on Lisinopril she is no longer taking this medication. Rachel Camacho is showing past due under [place].  [place] Management Direct Dial [phone]  Fax [phone] [email or web address]

## 2019-01-01 ENCOUNTER — Other Ambulatory Visit: Payer: Self-pay | Admitting: Family Medicine

## 2019-01-01 ENCOUNTER — Telehealth: Payer: Self-pay | Admitting: Family Medicine

## 2019-01-01 DIAGNOSIS — I152 Hypertension secondary to endocrine disorders: Secondary | ICD-10-CM

## 2019-01-01 DIAGNOSIS — E1159 Type 2 diabetes mellitus with other circulatory complications: Secondary | ICD-10-CM

## 2019-01-01 NOTE — Telephone Encounter (Signed)
-----   Message from Jerilee Field, [place] sent at 01/01/2019 11:46 AM EDT ----- Patient is due for follow up, please call the patient to make an appointment.  30 days of meds sent in Thanks. MPulliam, CMA/RT(R)

## 2019-01-26 ENCOUNTER — Other Ambulatory Visit: Payer: Self-pay | Admitting: Family Medicine

## 2019-01-26 DIAGNOSIS — I152 Hypertension secondary to endocrine disorders: Secondary | ICD-10-CM

## 2019-01-26 DIAGNOSIS — E1159 Type 2 diabetes mellitus with other circulatory complications: Secondary | ICD-10-CM

## 2019-01-27 ENCOUNTER — Telehealth: Payer: Self-pay

## 2019-01-27 NOTE — Telephone Encounter (Signed)
Please call pt to schedule f/u.  ABSOLUTELY no further refills until pt is seen.  Charyl Bigger, CMA

## 2019-02-05 ENCOUNTER — Other Ambulatory Visit: Payer: Self-pay | Admitting: Family Medicine

## 2019-02-05 DIAGNOSIS — E119 Type 2 diabetes mellitus without complications: Secondary | ICD-10-CM

## 2019-02-06 ENCOUNTER — Other Ambulatory Visit: Payer: Self-pay | Admitting: Family Medicine

## 2019-02-06 DIAGNOSIS — I152 Hypertension secondary to endocrine disorders: Secondary | ICD-10-CM

## 2019-02-06 DIAGNOSIS — E1159 Type 2 diabetes mellitus with other circulatory complications: Secondary | ICD-10-CM

## 2019-02-10 ENCOUNTER — Ambulatory Visit (INDEPENDENT_AMBULATORY_CARE_PROVIDER_SITE_OTHER): Payer: Medicare Other | Admitting: Family Medicine

## 2019-02-10 ENCOUNTER — Other Ambulatory Visit: Payer: Self-pay

## 2019-02-10 VITALS — BP 125/80 | HR 65 | Ht 63.0 in | Wt 157.0 lb

## 2019-02-10 DIAGNOSIS — E1129 Type 2 diabetes mellitus with other diabetic kidney complication: Secondary | ICD-10-CM

## 2019-02-10 DIAGNOSIS — I152 Hypertension secondary to endocrine disorders: Secondary | ICD-10-CM

## 2019-02-10 DIAGNOSIS — E1169 Type 2 diabetes mellitus with other specified complication: Secondary | ICD-10-CM | POA: Diagnosis not present

## 2019-02-10 DIAGNOSIS — F39 Unspecified mood [affective] disorder: Secondary | ICD-10-CM | POA: Diagnosis not present

## 2019-02-10 DIAGNOSIS — E119 Type 2 diabetes mellitus without complications: Secondary | ICD-10-CM | POA: Diagnosis not present

## 2019-02-10 DIAGNOSIS — I10 Essential (primary) hypertension: Secondary | ICD-10-CM

## 2019-02-10 DIAGNOSIS — E559 Vitamin D deficiency, unspecified: Secondary | ICD-10-CM

## 2019-02-10 DIAGNOSIS — E782 Mixed hyperlipidemia: Secondary | ICD-10-CM

## 2019-02-10 DIAGNOSIS — R809 Proteinuria, unspecified: Secondary | ICD-10-CM

## 2019-02-10 DIAGNOSIS — E1159 Type 2 diabetes mellitus with other circulatory complications: Secondary | ICD-10-CM | POA: Diagnosis not present

## 2019-02-10 MED ORDER — ATORVASTATIN CALCIUM 20 MG PO TABS: ORAL_TABLET | ORAL | 1 refills | Status: DC

## 2019-02-10 MED ORDER — METFORMIN HCL ER 500 MG PO TB24: 1000.0000 mg | ORAL_TABLET | Freq: Every day | ORAL | 1 refills | Status: DC

## 2019-02-10 MED ORDER — AMLODIPINE BESYLATE 5 MG PO TABS: 5.0000 mg | ORAL_TABLET | Freq: Every day | ORAL | 1 refills | Status: DC

## 2019-02-10 MED ORDER — LOSARTAN POTASSIUM 100 MG PO TABS: 100.0000 mg | ORAL_TABLET | Freq: Every day | ORAL | 1 refills | Status: DC

## 2019-02-10 MED ORDER — CITALOPRAM HYDROBROMIDE 20 MG PO TABS: 20.0000 mg | ORAL_TABLET | Freq: Every day | ORAL | 1 refills | Status: DC

## 2019-02-10 NOTE — Progress Notes (Signed)
Telehealth office visit note for Rachel Camacho, D.O- at Primary Care at [facility]   I connected with current patient today and verified that I am speaking with the correct person using two identifiers.   . Location of the patient: Home . Location of the provider: Office Only the patient (+/- their family members at pt's discretion) and myself were participating in the encounter    - This visit type was conducted due to national recommendations for restrictions regarding the COVID-19 Pandemic (e.g. social distancing) in an effort to limit this patient's exposure and mitigate transmission in our community.  This format is felt to be most appropriate for this patient at this time.   - The patient did not have access to video technology or had technical difficulties with video requiring transitioning to audio format only. - No physical exam could be performed with this format, beyond that communicated to [place] by the patient/ family members as noted.   - Additionally my office staff/ schedulers discussed with the patient that there may be a monetary charge related to this service, depending on their medical insurance.   The patient expressed understanding, and agreed to proceed.       History of Present Illness:  States she's been doing good.  Notes that "sometimes when she picks something up," her arm hurts.  She isn't sure why this is happening.  She does not remember an injury that caused this.  Patient has no concerns about her mood.  DM HPI:  -  She has been working on diet and exercise for diabetes  Medication compliance - continues as prescribed.  Home fasting glucose readings: Sugars have been between 105 to 138;  2 hr PP: not checking. Patient agrees to begin checking postprandial after review in office today.   Denies polyuria/polydipsia.  Denies hypo/ hyperglycemia symptoms  Last diabetic eye exam was  Lab Results  Component Value Date   HMDIABEYEEXA No  Retinopathy 01/14/2018   HMDIABEYEEXA No Retinopathy 01/14/2018    Last A1C in the office was:  Lab Results  Component Value Date   HGBA1C 6.3 (H) 08/05/2018   HGBA1C 6.6 (A) 04/09/2018   HGBA1C 6.4 12/19/2017    Lab Results  Component Value Date   MICROALBUR 80 07/09/2017   LDLCALC 62 08/05/2018   CREATININE 0.88 08/05/2018    Wt Readings from Last 3 Encounters:  02/10/19 157 lb (71.2 kg)  08/15/18 155 lb (70.3 kg)  05/09/18 160 lb 6.4 oz (72.8 kg)    BP Readings from Last 3 Encounters:  02/10/19 125/80  09/26/18 139/89  08/15/18 (!) 148/88    1. 73 y.o. female here for cholesterol follow-up.   - Patient reports good compliance with medications or treatment plan. Continues taking cholesterol pills before bed.  States she stopped exercising for a while, but now that the weather has cooled off, she's been working out outside again.  She walks around the park for an hour and does some weight training.  - Denies medication S-E   - Smoking Status noted   - She denies new onset of: chest pain, exercise intolerance, shortness of breath, dizziness, visual changes, headache, lower extremity swelling or claudication.   Denies myalgias  The cholesterol last visit was:  Lab Results  Component Value Date   CHOL 136 08/05/2018   HDL 52 08/05/2018   LDLCALC 62 08/05/2018   TRIG 108 08/05/2018   CHOLHDL 2.6 08/05/2018    Hepatic Function  Latest Ref Rng & Units 08/05/2018 07/12/2017 06/10/2010  Total Protein 6.0 - 8.5 g/dL 7.3 7.4 7.0  Albumin 3.7 - 4.7 g/dL 4.8(H) 4.6 4.3  AST 0 - 40 IU/L 19 22 34  ALT 0 - 32 IU/L '14 27 28  ' Alk Phosphatase 39 - 117 IU/L 78 66 52  Total Bilirubin 0.0 - 1.2 mg/dL 1.1 0.7 0.7    2. HTN HPI:  -  Her blood pressure has been controlled at home.  Pt is checking it at home.   Running 128/80.  It's been up to 134/87, one day 139/90.  Overall states "it's been good."  - Patient reports good compliance with blood pressure medications  -  Denies medication S-E   - Smoking Status noted   - She denies new onset of: chest pain, exercise intolerance, shortness of breath, dizziness, visual changes, headache, lower extremity swelling or claudication.   Last 3 blood pressure readings in our office are as follows: BP Readings from Last 3 Encounters:  02/10/19 125/80  09/26/18 139/89  08/15/18 (!) 148/88    Filed Weights   02/10/19 1321  Weight: 157 lb (71.2 kg)      No flowsheet data found.  Depression screen [facility] 2/9 09/26/2018 05/09/2018 04/09/2018 12/19/2017 12/19/2017  Decreased Interest 1 0 0 0 0  Down, Depressed, Hopeless 0 0 0 0 0  PHQ - 2 Score 1 0 0 0 0  Altered sleeping 1 0 0 0 0  Tired, decreased energy 1 0 0 0 0  Change in appetite 0 0 0 0 0  Feeling bad or failure about yourself  0 0 0 0 0  Trouble concentrating 0 0 0 0 0  Moving slowly or fidgety/restless 0 0 0 0 0  Suicidal thoughts 0 0 0 0 0  PHQ-9 Score 3 0 0 0 0  Difficult doing work/chores Somewhat difficult Not difficult at all Not difficult at all Not difficult at all Not difficult at all      Impression and Recommendations:    1. Mood disorder ([place])   2. Diabetes mellitus without complication ([place])   3. Hypertension associated with diabetes ([place])   4. Mixed diabetic hyperlipidemia associated with type 2 diabetes mellitus ([place])   5. Microalbuminuria due to type 2 diabetes mellitus ([place])   6. Vitamin D deficiency       Diabetes Mellitus without complication - V7Q 6.3 last check, 6 months ago. - discussed returning in another 3-6 months for re-check.  - Fasting sugars well-controlled at this time. - Continue treatment plan as established.  See med list below. - Patient tolerating meds well without complication.  Denies S-E.  - Counseled patient on pathophysiology of disease and discussed various treatment options, which often includes dietary and lifestyle modifications as first line.  Importance of low carb diet discussed with patient  in addition to regular exercise.   - Check FBS and 2 hours after the biggest meal of your day.  Keep log and bring in next OV for my review.   Also, if you ever feel poorly, please check your blood pressure and blood sugar, as one or the other could be the cause of your symptoms.  - Being a diabetic, you need yearly eye and foot exams. Make appt.for diabetic eye exam.  - will continue to monitor.  Microalbuminuria due to type 2 DM - Stable last check. - Will continue to monitor and re-check as recommended.  Hyperlipidemia associated with DM - Stable last check. -  Advised patient to continue management on statins as prescribed.  See med list below. - Patient tolerating meds well without complication.  Denies S-E.  - Ongoing prudent dietary changes such as low saturated & trans fat and low carb diets discussed with patient.  Encouraged regular exercise and weight loss when appropriate.  - Will continue to monitor and re-check as recommended.  Hypertension associated with DM - BP stable at home. - Continue treatment plan as prescribed.  See med list. - Patient tolerating meds well without S-E.  - Lifestyle changes such as dash diet and engaging in a regular exercise program discussed with patient.   - Ongoing ambulatory BP monitoring encouraged. Keep log and bring in next OV.  - Will continue to monitor.  Vitamin D Deficiency - Stable at this time. - 65.7 last check, up from 26.4 prior. - Continue supplementation as prescribed. - Will continue to monitor and re-check as recommended.  Mood Disorder - Managed on Celexa.  See med list. - Stable at this time.  - Reviewed the "spokes of the wheel" of mood and health management.  Stressed the importance of ongoing prudent habits, including regular exercise, appropriate sleep hygiene, healthful dietary habits, and prayer/meditation to relax.  - Will continue to monitor.  Intermittent Arm Pain - patient agrees to continue  monitoring her arm pain and paying attention to what makes it worse or better. - patient understands that she may return for further assessment at any time PRN.  Lifestyle & Preventative Health Maintenance - Advised patient to continue working toward exercising to improve overall mental, physical, and emotional health.    - Continue weight bearing exercises to improve strength.  - Encouraged patient to engage in daily physical activity as tolerated, especially a formal exercise routine.  Recommended that the patient eventually strive for at least 150 minutes of moderate cardiovascular activity per week according to guidelines established by the [facility].   - Healthy dietary habits encouraged, including low-carb, and high amounts of lean protein in diet.   - Patient should also consume adequate amounts of water.  Recommendations - Patient knows to call in future PRN to address concerns about arm pain, joint pain, or other acute issues.  Otherwise patient will return in 4 months for DM, BP, and chronic issues. - Full lab work last checked March of 2020.  Will re-check as recommended. - Near future, lab only for urine microalbumin, kidney function, and A1c since it's been six months.  See orders.  - As part of my medical decision making, I reviewed the following data within the [place] History obtained from pt /family, CMA notes reviewed and incorporated if applicable, Labs reviewed, Radiograph/ tests reviewed if applicable and OV notes from prior OV's with me, as well as other specialists she/he has seen since seeing me last, were all reviewed and used in my medical decision making process today.   - Additionally, discussion had with patient regarding txmnt plan, and their biases/concerns about that plan were used in my medical decision making today.   - The patient agreed with the plan and demonstrated an understanding of the instructions.   No barriers to understanding were  identified.   - Red flag symptoms and signs discussed in detail.  Patient expressed understanding regarding what to do in case of emergency\ urgent symptoms.  The patient was advised to call back or seek an in-person evaluation if the symptoms worsen or if the condition fails to improve as anticipated.  Return for f/up near future if needed arm pain, otherwise 4 mo for chronic .    Orders Placed This Encounter  Procedures  . Microalbumin / creatinine urine ratio  . Comprehensive metabolic panel  . Hemoglobin A1c    Meds ordered this encounter  Medications  . metFORMIN (GLUCOPHAGE-XR) 500 MG 24 hr tablet    Sig: Take 2 tablets (1,000 mg total) by mouth daily with breakfast.    Dispense:  180 tablet    Refill:  1    Requesting 1 year supply  . amLODipine (NORVASC) 5 MG tablet    Sig: Take 1 tablet (5 mg total) by mouth daily.    Dispense:  90 tablet    Refill:  1  . losartan (COZAAR) 100 MG tablet    Sig: Take 1 tablet (100 mg total) by mouth daily.    Dispense:  90 tablet    Refill:  1  . atorvastatin (LIPITOR) 20 MG tablet    Sig: TAKE 1 TABLET BY MOUTH EVERYDAY AT BEDTIME    Dispense:  90 tablet    Refill:  1  . citalopram (CELEXA) 20 MG tablet    Sig: Take 1 tablet (20 mg total) by mouth daily.    Dispense:  90 tablet    Refill:  1    Medications Discontinued During This Encounter  Medication Reason  . atorvastatin (LIPITOR) 20 MG tablet Reorder  . losartan (COZAAR) 100 MG tablet Reorder  . citalopram (CELEXA) 20 MG tablet Reorder  . amLODipine (NORVASC) 5 MG tablet Reorder  . metFORMIN (GLUCOPHAGE-XR) 500 MG 24 hr tablet Reorder    I provided 18 minutes of non face-to-face time during this encounter.  Additional time was spent with charting and coordination of care after the actual visit commenced.   Note:  This note was prepared with assistance of Dragon voice recognition software. Occasional wrong-word or sound-a-like substitutions may have occurred due to the  inherent limitations of voice recognition software.  This document serves as a record of services personally performed by Rachel Dance, DO. It was created on her behalf by Toni Amend, a trained medical scribe. The creation of this record is based on the scribe's personal observations and the provider's statements to them.   I have reviewed the above medical documentation for accuracy and completeness and I concur.  Rachel Dance, DO 02/12/2019 3:17 PM          Patient Care Team    Relationship Specialty Notifications Start End  Rachel Dance, DO PCP - General Family Medicine  07/04/17   Jamal Maes, MD Consulting Physician Nephrology  07/04/17      -Vitals obtained; medications/ allergies reconciled;  personal medical, social, Sx etc.histories were updated by CMA, reviewed by me and are reflected in chart   Patient Active Problem List   Diagnosis Date Noted  . Diabetes mellitus without complication ([place]) 51/70/0174    Priority: High  . Hypertension associated with diabetes ([place]) 07/04/2017    Priority: High  . Mixed diabetic hyperlipidemia associated with type 2 diabetes mellitus ([place]) 07/04/2017    Priority: High  . 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 Microalbuminuria due to type 2 diabetes mellitus ([place]) 07/04/2017    Priority: Medium  . 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 Vitamin D deficiency 07/16/2017    Priority: Low  .  Mood disorder ([place]) 07/04/2017     Current Meds  Medication Sig  . amLODipine (NORVASC) 5 MG tablet Take 1 tablet (5 mg total) by mouth daily.  Marland Kitchen atorvastatin (LIPITOR) 20 MG tablet TAKE 1 TABLET BY MOUTH EVERYDAY AT BEDTIME  . Blood Glucose Monitoring Suppl (ONETOUCH VERIO) w/Device KIT Use to test glucose fating in AM and 2 hours after largest meal  .  Calcium Carbonate-Vit D-Min (CALCIUM 1200 PO) Take 1 tablet by mouth daily.  . Cholecalciferol (VITAMIN D-3) 5000 units TABS Take 1 tablet by mouth daily.  . citalopram (CELEXA) 20 MG tablet Take 1 tablet (20 mg total) by mouth daily.  . Lancets (ONETOUCH DELICA PLUS [id number]) MISC 2 each by Does not apply route daily.  Marland Kitchen losartan (COZAAR) 100 MG tablet Take 1 tablet (100 mg total) by mouth daily.  . metFORMIN (GLUCOPHAGE-XR) 500 MG 24 hr tablet Take 2 tablets (1,000 mg total) by mouth daily with breakfast.  . ONETOUCH VERIO test strip USE TO CHECK FASTING GLUCOSE IN THE AM AND TO CHECK 2 HOURS AFTER LARGEST MEAL OF THE DAY.  . [DISCONTINUED] amLODipine (NORVASC) 5 MG tablet Take 1 tablet (5 mg total) by mouth daily. OFFICE VISIT REQUIRED PRIOR TO ANY FURTHER REFILLS  . [DISCONTINUED] atorvastatin (LIPITOR) 20 MG tablet TAKE 1 TABLET BY MOUTH EVERYDAY AT BEDTIME  . [DISCONTINUED] citalopram (CELEXA) 20 MG tablet Take 20 mg by mouth daily.  . [DISCONTINUED] losartan (COZAAR) 100 MG tablet Take 1 tablet (100 mg total) by mouth daily.  . [DISCONTINUED] metFORMIN (GLUCOPHAGE-XR) 500 MG 24 hr tablet Take 2 tablets (1,000 mg total) by mouth daily with breakfast.     Allergies:  Allergies  Allergen Reactions  . Penicillins Hives     ROS:  See above HPI for pertinent positives and negatives   Objective:   Blood pressure 125/80, pulse 65, height '5\' 3"'  (1.6 m), weight 157 lb (71.2 kg).  (if some vitals are omitted, this means that patient was UNABLE to obtain them even though they were asked to get them prior to OV today.  They were asked to call us at their earliest convenience with these once obtained. )  General: A & O * 3; sounds in no acute distress; in usual state of health.  Skin: Pt confirms warm and dry extremities and pink fingertips HEENT: Pt confirms lips non-cyanotic Chest: Patient confirms normal chest excursion and movement Respiratory: speaking in full sentences, no  conversational dyspnea; patient confirms no use of accessory muscles Psych: insight appears good, mood- appears full

## 2019-02-11 ENCOUNTER — Other Ambulatory Visit: Payer: Self-pay

## 2019-02-11 NOTE — Patient Outreach (Signed)
[place] [facility]) Care Management  02/11/2019  Rachel Camacho 02/27/1946 [id number]   Medication Adherence call to Mrs. Rachel Camacho HIPPA Compliant Voice message left with a call back number. Rachel Camacho is showing past due on Atorvastatin 20 mg under [place].   [place] Management Direct Dial [phone]  Fax [phone] [email or web address]

## 2019-05-01 ENCOUNTER — Other Ambulatory Visit: Payer: Self-pay

## 2019-05-01 NOTE — Patient Outreach (Signed)
[place] [facility]) Care Management  05/01/2019  Rachel Camacho 01/12/1946 [id number]   Medication Adherence call to [name] HIPPA Compliant Voice message left with a call back number. [name] is showing past due on Metformin Er 500 mg under [place].  [place] Management Direct Dial [phone]  Fax [phone] [name]

## 2019-05-07 ENCOUNTER — Other Ambulatory Visit: Payer: Self-pay

## 2019-05-07 NOTE — Patient Outreach (Signed)
[place] [facility]) Care Management  05/07/2019  Rachel Camacho Feb 05, 1946 [id number]  Medication Adherence call to Rachel Camacho HIPPA Compliant Voice message left with a call back number. Rachel Camacho is showing past due on Metformin Er 500 mg under [place].   [place] Management Direct Dial [phone]  Fax [phone] [email or web address]

## 2019-06-22 ENCOUNTER — Other Ambulatory Visit: Payer: Self-pay | Admitting: Family Medicine

## 2019-06-22 DIAGNOSIS — F39 Unspecified mood [affective] disorder: Secondary | ICD-10-CM

## 2019-06-25 ENCOUNTER — Other Ambulatory Visit: Payer: Self-pay

## 2019-06-25 ENCOUNTER — Encounter: Payer: Self-pay | Admitting: Family Medicine

## 2019-06-25 ENCOUNTER — Ambulatory Visit (INDEPENDENT_AMBULATORY_CARE_PROVIDER_SITE_OTHER): Payer: Medicare Other | Admitting: Family Medicine

## 2019-06-25 VITALS — BP 132/78 | HR 70 | Temp 98.2°F | Resp 10 | Ht 62.0 in | Wt 152.8 lb

## 2019-06-25 DIAGNOSIS — F39 Unspecified mood [affective] disorder: Secondary | ICD-10-CM

## 2019-06-25 DIAGNOSIS — E1129 Type 2 diabetes mellitus with other diabetic kidney complication: Secondary | ICD-10-CM | POA: Diagnosis not present

## 2019-06-25 DIAGNOSIS — R809 Proteinuria, unspecified: Secondary | ICD-10-CM

## 2019-06-25 DIAGNOSIS — E119 Type 2 diabetes mellitus without complications: Secondary | ICD-10-CM

## 2019-06-25 DIAGNOSIS — E1169 Type 2 diabetes mellitus with other specified complication: Secondary | ICD-10-CM | POA: Diagnosis not present

## 2019-06-25 DIAGNOSIS — M19041 Primary osteoarthritis, right hand: Secondary | ICD-10-CM

## 2019-06-25 DIAGNOSIS — E1159 Type 2 diabetes mellitus with other circulatory complications: Secondary | ICD-10-CM | POA: Diagnosis not present

## 2019-06-25 DIAGNOSIS — E782 Mixed hyperlipidemia: Secondary | ICD-10-CM

## 2019-06-25 DIAGNOSIS — E559 Vitamin D deficiency, unspecified: Secondary | ICD-10-CM

## 2019-06-25 DIAGNOSIS — E0822 Diabetes mellitus due to underlying condition with diabetic chronic kidney disease: Secondary | ICD-10-CM | POA: Diagnosis not present

## 2019-06-25 DIAGNOSIS — I10 Essential (primary) hypertension: Secondary | ICD-10-CM

## 2019-06-25 DIAGNOSIS — M19042 Primary osteoarthritis, left hand: Secondary | ICD-10-CM

## 2019-06-25 DIAGNOSIS — N183 Chronic kidney disease, stage 3 unspecified: Secondary | ICD-10-CM

## 2019-06-25 DIAGNOSIS — I152 Hypertension secondary to endocrine disorders: Secondary | ICD-10-CM

## 2019-06-25 LAB — POCT UA - MICROALBUMIN
Creatinine, POC: 100 mg/dL
Microalbumin Ur, POC: 80 mg/L

## 2019-06-25 LAB — POCT GLYCOSYLATED HEMOGLOBIN (HGB A1C): Hemoglobin A1C: 6.6 % — AB (ref 4.0–5.6)

## 2019-06-25 NOTE — Patient Instructions (Signed)
Preventing Osteoarthritis, Adult Osteoarthritis is a type of arthritis that affects tissue that covers the ends of bones in joints (cartilage). Cartilage acts as a cushion between the bones and helps them move smoothly. Osteoarthritis results when cartilage in the joints gets worn down. This causes the joint to break down over time. Osteoarthritis cannot always be prevented, but you can take steps to lower your risk of developing this condition. Once you have osteoarthritis, it does not go away, so lowering your risk is important. How can this condition affect me? Osteoarthritis can affect any joint and can make movement painful. It usually affects the joints of the hands, spine, hips, knees, or toes. The knee joints are most commonly affected. The condition can cause symptoms such as:  Pain, tenderness, and stiffness in a joint, especially first thing in the morning or after a period of resting.  Trouble moving the affected joint.  A grating or scraping feeling inside the joint when using it. This condition can make it harder to do things that you need or want to do each day. Osteoarthritis in a major joint, such as your knee or hip, can make it painful to walk or exercise. If you develop osteoarthritis in your hands, you might not be able to grip items, twist your hand, or control small movements of your hands and fingers. Osteoarthritis is a degenerative disease. This means that it may get worse over time. What can increase my risk? Some factors may make you more likely to develop osteoarthritis, such as:  Being 75 years of age or older.  Being overweight.  Doing activities that put extra stress on your joints.  Injuring or overusing a joint.  Having previous surgery on a joint.  Having a family history of osteoarthritis. Some risk factors, such as your age and family history, cannot be changed. But you can take steps to control other risk factors and lower your chances of developing  osteoarthritis. What actions can I take to prevent this condition? Activity      Stay active. Doing moderate exercise for 30 minutes, 5 times a week will help you maintain a healthy weight and strengthen the muscles that support your joints. Good exercises for preventing osteoarthritis and protecting your joints include low-impact activities, such as walking, swimming, hiking, yoga, and biking.  Avoid doing too many high-impact exercises and activities that put extra stress on joints. These include activities that involve heavy lifting, gripping, bending, kneeling, and squatting. Osteoarthritis is much more common in people with jobs that require these activities. If your job involves this type of activity, learn ways to reduce the stress on your joints, such as: ? Using proper lifting techniques. ? Taking breaks as needed.  Consider working with a Music therapist when starting a new physical activity or sport. A coach or trainer can help you: ? Learn proper techniques for the activity. ? Come up with a training plan that is safe and effective for you.  Get treatment for any joint injury. After an injury, do not return to full activity until you have been cleared by your health care provider. An injured joint that does not heal properly is much more likely to develop osteoarthritis.  Maintain good posture while standing and sitting. This reduces stress on the joints of your back, neck, and hips. ? When standing, keep your upper back and neck straight, with your shoulders pulled back. Avoid slouching. ? When sitting, keep your back straight and relax your shoulders. Do not  round your shoulders or pull them backward. Lifestyle  Lose weight if you are overweight. More weight puts more stress on your joints.  Control your blood sugar. Diabetes may increase your risk of osteoarthritis. Have your blood sugar checked to make sure you are not at risk of developing diabetes. If you already have  diabetes, work closely with your health care provider to keep your blood sugar at a healthy level.  Do not wear high heels. These put stress on your toes, knees, and hips.  Do not use any products that contain nicotine or tobacco, such as cigarettes, e-cigarettes, and chewing tobacco. If you need help quitting, ask your health care provider. Nutrition   Eat a healthy diet that includes foods that are high in vitamin C, vitamin D, and omega-3 fatty acids. ? Get vitamin C from fruits and vegetables. ? Get vitamin D from fish, eggs, and dairy products. Some foods have vitamin D added to them (are fortified). Look for fortified foods like milk, orange juice, and cereals. Spending some time in the sun also increases vitamin D. ? Get omega-3 from cold-water fish, such as cod, mackerel, and sardines. Other sources include:  Fortified eggs.  Soybeans.  Nuts, such as walnuts.  Flax, chia, and hemp seeds.  Check with your health care provider before taking an over-the-counter supplement for bone or joint health, especially if you are taking any medicines. Supplements may interact with some medicines. Also, evidence that taking a supplement will reduce your risk of osteoarthritis is not strong. Where to find more information  Arthritis Foundation: www.arthritis.[place] of Rheumatology: www.rheumatology.[facility] of Arthritis and Musculoskeletal and Skin Diseases: www.niams.SouthExposed.es Contact a health care provider if:  You have pain or swelling in a joint.  You have joint stiffness or you lose full motion at the joint.  Your joint becomes large and knobby.  You have cracking or grinding in a joint.  You have a joint injury. Summary  Osteoarthritis involves the loss of tissue that covers the ends of bones in joints (cartilage). It can affect any joint and can make movement painful.  Osteoarthritis is most common in middle and older age.  Osteoarthritis  cannot always be prevented, but you can take steps to lower your risk of developing this condition.  Eating a healthy diet, losing weight, and staying active may lower your risk of osteoarthritis.  Let your health care provider know if you have a joint injury or joint pain. This information is not intended to replace advice given to you by your health care provider. Make sure you discuss any questions you have with your health care provider. Document Revised: 11/18/2018 Document Reviewed: 08/13/2018 Elsevier Patient Education  [place].       Osteoarthritis  Osteoarthritis is a type of arthritis that affects tissue that covers the ends of bones in joints (cartilage). Cartilage acts as a cushion between the bones and helps them move smoothly. Osteoarthritis results when cartilage in the joints gets worn down. Osteoarthritis is sometimes called "wear and tear" arthritis. Osteoarthritis is the most common form of arthritis. It often occurs in older people. It is a condition that gets worse over time (a progressive condition). Joints that are most often affected by this condition are in:  Fingers.  Toes.  Hips.  Knees.  Spine, including neck and lower back. What are the causes? This condition is caused by age-related wearing down of cartilage that covers the ends of bones. What increases  the risk? The following factors may make you more likely to develop this condition:  Older age.  Being overweight or obese.  Overuse of joints, such as in athletes.  Past injury of a joint.  Past surgery on a joint.  Family history of osteoarthritis. What are the signs or symptoms? The main symptoms of this condition are pain, swelling, and stiffness in the joint. The joint may lose its shape over time. Small pieces of bone or cartilage may break off and float inside of the joint, which may cause more pain and damage to the joint. Small deposits of bone (osteophytes) may grow on the  edges of the joint. Other symptoms may include:  A grating or scraping feeling inside the joint when you move it.  Popping or creaking sounds when you move. Symptoms may affect one or more joints. Osteoarthritis in a major joint, such as your knee or hip, can make it painful to walk or exercise. If you have osteoarthritis in your hands, you might not be able to grip items, twist your hand, or control small movements of your hands and fingers (fine motor skills). How is this diagnosed? This condition may be diagnosed based on:  Your medical history.  A physical exam.  Your symptoms.  X-rays of the affected joint(s).  Blood tests to rule out other types of arthritis. How is this treated? There is no cure for this condition, but treatment can help to control pain and improve joint function. Treatment plans may include:  A prescribed exercise program that allows for rest and joint relief. You may work with a physical therapist.  A weight control plan.  Pain relief techniques, such as: ? Applying heat and cold to the joint. ? Electric pulses delivered to nerve endings under the skin (transcutaneous electrical nerve stimulation, or TENS). ? Massage. ? Certain nutritional supplements.  NSAIDs or prescription medicines to help relieve pain.  Medicine to help relieve pain and inflammation (corticosteroids). This can be given by mouth (orally) or as an injection.  Assistive devices, such as a brace, wrap, splint, specialized glove, or cane.  Surgery, such as: ? An osteotomy. This is done to reposition the bones and relieve pain or to remove loose pieces of bone and cartilage. ? Joint replacement surgery. You may need this surgery if you have very bad (advanced) osteoarthritis. Follow these instructions at home: Activity  Rest your affected joints as directed by your health care provider.  Do not drive or use heavy machinery while taking prescription pain medicine.  Exercise as  directed. Your health care provider or physical therapist may recommend specific types of exercise, such as: ? Strengthening exercises. These are done to strengthen the muscles that support joints that are affected by arthritis. They can be performed with weights or with exercise bands to add resistance. ? Aerobic activities. These are exercises, such as brisk walking or water aerobics, that get your heart pumping. ? Range-of-motion activities. These keep your joints easy to move. ? Balance and agility exercises. Managing pain, stiffness, and swelling      If directed, apply heat to the affected area as often as told by your health care provider. Use the heat source that your health care provider recommends, such as a moist heat pack or a heating pad. ? If you have a removable assistive device, remove it as told by your health care provider. ? Place a towel between your skin and the heat source. If your health care provider tells you  to keep the assistive device on while you apply heat, place a towel between the assistive device and the heat source. ? Leave the heat on for 20-30 minutes. ? Remove the heat if your skin turns bright red. This is especially important if you are unable to feel pain, heat, or cold. You may have a greater risk of getting burned.  If directed, put ice on the affected joint: ? If you have a removable assistive device, remove it as told by your health care provider. ? Put ice in a plastic bag. ? Place a towel between your skin and the bag. If your health care provider tells you to keep the assistive device on during icing, place a towel between the assistive device and the bag. ? Leave the ice on for 20 minutes, 2-3 times a day. General instructions  Take over-the-counter and prescription medicines only as told by your health care provider.  Maintain a healthy weight. Follow instructions from your health care provider for weight control. These may include dietary  restrictions.  Do not use any products that contain nicotine or tobacco, such as cigarettes and e-cigarettes. These can delay bone healing. If you need help quitting, ask your health care provider.  Use assistive devices as directed by your health care provider.  Keep all follow-up visits as told by your health care provider. This is important. Where to find more information  [employer] of Arthritis and Musculoskeletal and Skin Diseases: www.niams.[place]posed.es  [employer] on Aging: http://kim-miller.com/  American College of Rheumatology: www.rheumatology.org Contact a health care provider if:  Your skin turns red.  You develop a rash.  You have pain that gets worse.  You have a fever along with joint or muscle aches. Get help right away if:  You lose a lot of weight.  You suddenly lose your appetite.  You have night sweats. Summary  Osteoarthritis is a type of arthritis that affects tissue covering the ends of bones in joints (cartilage).  This condition is caused by age-related wearing down of cartilage that covers the ends of bones.  The main symptom of this condition is pain, swelling, and stiffness in the joint.  There is no cure for this condition, but treatment can help to control pain and improve joint function. This information is not intended to replace advice given to you by your health care provider. Make sure you discuss any questions you have with your health care provider. Document Revised: 04/20/2017 Document Reviewed: 01/10/2016 Elsevier Patient Education  2020 [employer].

## 2019-06-25 NOTE — Progress Notes (Signed)
Impression and Recommendations:    1. Diabetes mellitus without complication (Rachel Camacho)   2. Mixed diabetic hyperlipidemia associated with type 2 diabetes mellitus (Rachel Camacho)   3. Hypertension associated with diabetes (Rachel Camacho)   4. Microalbuminuria due to type 2 diabetes mellitus (Rachel Camacho)   5. Diabetes mellitus due to underlying condition with stage 3 chronic kidney disease, without long-term current use of insulin, unspecified whether stage 3a or 3b CKD (Rachel Camacho)   6. Vitamin D deficiency   7. Mood disorder (Rachel Camacho)   8. Osteoarthritis of both hands, unspecified osteoarthritis type     Hand OA/ Joint Pain - Discussed that patient's symptoms are likely caused by arthritis. - Extensive education provided to patient today and all questions answered.  - To help alleviate symptoms, told patient she may take tylenol OTC. - Encouraged patient to engage in prudent physical activity. - Discussed importance of healthy, high antioxidant diet.  - If possible, encouraged patient to stop repetitive motions with her joints.  - Will continue to monitor and obtain further assessment as needed. - Advised patient that if her quality of life is diminished, she may be seen by a specialist in the future.  Mood Disorder - Mood worsening during COVID-19 pandemic.  - Increase dose of Celexa to 30 mg from 20 mg prior. - Patient will take one and a half tablets daily.  - In addition to prescription intervention, reviewed the "spokes of the wheel" of mood and health management.  Stressed the importance of ongoing prudent habits, including regular exercise, appropriate sleep hygiene, healthful dietary habits, and prayer/meditation to relax.  - Encouraged patient to engage in video calls and chats with family members and friends.  - Told patient to get outside every day and walk for 20 minutes minimum. - Encouraged patient to look up exercises and other free resources on YouTube.  - If desired, encouraged patient to  look into adopting a pet as a companion at home.  - Will continue to monitor.  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 - A1c re-checked today.  - Pt will continue current treatment regimen.  See med list.  - Counseled patient on pathophysiology of disease and discussed various treatment options, which always includes dietary and lifestyle modification as first line.    - Importance of low carb, heart-healthy diet discussed with patient in addition to regular aerobic exercise of 2mn 5d/week or more.   - Check FBS and 2 hours after the biggest meal of your day.  Keep log and bring in next OV for my review.     - Also told patient if you ever feel poorly, please check your blood pressure and blood sugar, as one or the other could be the cause of your symptoms.  - Pt reminded about need for yearly eye and foot exams.  Told patient to make appt.for diabetic eye exam, CMAs here will do foot exams  - We will continue to monitor.  Mixed diabetic hyperlipidemia associated with type 2 diabetes mellitus - Cholesterol levels at goal last check. - Due for re-check near future.  - Pt will continue current treatment regimen.  See med list.  - Prudent dietary changes such as low saturated & trans fat diets for hyperlipidemia and low carb diets for hypertriglyceridemia discussed with patient.    -  Encouraged patient to follow AHA guidelines for regular exercise and also engage in weight loss if BMI above 25.   - We will continue to monitor and re-check as discussed. - Due for re-check in two months.  Hypertension associated with diabetes - Blood pressure currently at goal, stable. - Patient will continue current treatment regimen.  See med list.  - Advised patient that with her diabetes and  CKD, the lower the blood pressure, the better, as long as she is not experiencing symptomatic low blood pressure.  Patient knows to monitor for symptomatic low BP.  - Counseled patient on pathophysiology of disease and discussed various treatment options, which always includes dietary and lifestyle modification as first line.   - Lifestyle changes such as dash and heart healthy diets and engaging in a regular exercise program discussed extensively with patient.   - Ambulatory blood pressure monitoring encouraged at least 3 times weekly.  Keep log and bring in every office visit.  Reminded patient that if they ever feel poorly in any way, to check their blood pressure and pulse.  - We will continue to monitor.  Health Counseling & Preventative Maintenance - Advised patient to continue working toward exercising to improve overall mental, physical, and emotional health.    - Encouraged patient to engage in daily physical activity as tolerated, especially a formal exercise routine.  Recommended that the patient eventually strive for at least 150 minutes of moderate cardiovascular activity per week according to guidelines established by the [facility].   - Healthy dietary habits encouraged, including low-carb, and high amounts of lean protein in diet.   - Patient should also consume adequate amounts of water.  - Health counseling performed.  All questions answered.  COVID-19 Counseling - Counseling and education provided to patient today. - Encouraged patient to obtain the COVID-19 vaccination as soon as she is able. - Discussed expectations for pandemic and vaccination process moving forward. - Encouraged patient to continue to socially distance and follow guidelines. - Discussed meaning of high-risk exposure situations with patient today, and recommendations. - Patient knows to call if she has any further questions or concerns.  Recommendations - Return in two months for full fasting lab work and  [employer] same day.   Orders Placed This Encounter  Procedures  . CBC w/Diff  . Comprehensive metabolic panel  . Hemoglobin A1c  . TSH  . T4, free  . Lipid panel  . B12  . VITAMIN D 25 Hydroxy (Vit-D Deficiency, Fractures)  . POCT UA - Microalbumin  . POCT glycosylated hemoglobin (Hb A1C)   Gross side effects, risk and benefits, and alternatives of medications and treatment plan in general discussed with patient.  Patient is aware that all medications have potential side effects and we are unable to predict every side effect or drug-drug interaction that may occur.   Patient will call with any questions prior to using medication if they have concerns.    Expresses verbal understanding and consents to current therapy and treatment regimen.  No barriers to understanding were identified.  Red flag symptoms and signs discussed in detail.  Patient expressed understanding regarding what to do in case of emergency\urgent symptoms  Please see AVS handed out to patient at the end of our visit for further patient instructions/ counseling done pertaining to today's office visit.   Return for f/up 2 mo for medicare wellness and FBW that same day.     Note:  This note was prepared with assistance of  [employer] training and education officer. Occasional wrong-word or sound-a-like substitutions may have occurred due to the inherent limitations of voice recognition software.    This document serves as a record of services personally performed by Mellody Dance, DO. It was created on her behalf by Toni Amend, a trained medical scribe. The creation of this record is based on the scribe's personal observations and the provider's statements to them.   This case required medical decision making of at least moderate complexity. The above documentation from Toni Amend, medical scribe, has been reviewed by Marjory Sneddon,  D.O.     --------------------------------------------------------------------------------------------------------------------------------------------------------------------------------------------------------------------------------------------    Subjective:    Phillips Odor, am serving as scribe for Dr. Mellody Dance.  HPI: Rachel Camacho is a 74 y.o. female who presents to [place] [facility] today for issues as discussed below.  - Mood Difficulties during COVID-19 Notes she's been up and down mood-wise with COVID and isolation.  Says "it's been really tough the last few months, I really want to go back to the office."  She continues working from home.  "It's been a whole year stuck in the house."  She is not exercising much, "not as much as I used to," which she notes is probably part of her current worsened emotional state.  Regarding the Celexa, says "I don't feel it period.  I don't know if it's doing anything."  Notes she's struggling sometimes.  Admits to sitting around and feeling more depressed than usual, struggling to get out of bed and sometimes feeling frustrated.  - Hydration Notes she drinks water and a lot of tea.  Reiterates "a LOT of tea."  - Pain in Finger, L fourth digit DIT joint Notes she's had pain in her finger for a couple of months, and "lately it's been getting really bad."  States the pain comes and goes.  Denies injury to the area.  Denies pain in that joint in any other fingers.  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  HPI:  Hypertension:  -  Her blood pressure at home has been running: Notes it's been good; states "sometimes it's low, like 119/80 or something."  - Patient reports good compliance with medication and/or lifestyle modification  - Her denies acute concerns or problems related to treatment plan  - She denies new onset of: chest pain, exercise intolerance, shortness of breath, dizziness,  visual changes, headache, lower extremity swelling or claudication.   Last 3 blood pressure readings in our office are as follows: BP Readings from Last 3 Encounters:  06/25/19 132/78  02/10/19 125/80  09/26/18 139/89   Filed Weights   06/25/19 1027  Weight: 152 lb 12.8 oz (69.3 kg)    HPI:   Diabetes Mellitus:   - Patient reports good compliance with therapy plan: medication and/or lifestyle modification  - Her denies acute concerns or problems related to treatment plan  - She denies new concerns.  Denies polyuria/polydipsia, hypo/ hyperglycemia symptoms.  Denies new onset of: chest pain, exercise intolerance, shortness of breath, dizziness, visual changes, headache, lower extremity swelling or claudication.   Last A1C in the office was:  Lab Results  Component Value Date   HGBA1C 6.6 (A) 06/25/2019   HGBA1C 6.3 (H) 08/05/2018   HGBA1C 6.6 (A) 04/09/2018   Lab Results  Component Value Date   MICROALBUR 80 06/25/2019   LDLCALC 62 08/05/2018   CREATININE 0.88 08/05/2018   BP Readings from Last 3 Encounters:  06/25/19 132/78  02/10/19 125/80  09/26/18 139/89   Wt Readings from Last 3 Encounters:  06/25/19 152 lb 12.8 oz (69.3 kg)  02/10/19 157 lb (71.2 kg)  08/15/18 155 lb (70.3 kg)   HPI:  Hyperlipidemia:  74 y.o. female here for cholesterol follow-up.   - Patient reports good compliance with treatment plan of:  medication and/ or lifestyle management.    - Patient denies any acute concerns or problems with management plan   - She denies new onset of: myalgias, arthralgias, increased fatigue more than normal, chest pains, exercise intolerance, shortness of breath, dizziness, visual changes, headache, lower extremity swelling or claudication.   Most recent cholesterol panel was:  Lab Results  Component Value Date   CHOL 136 08/05/2018   HDL 52 08/05/2018   LDLCALC 62 08/05/2018   TRIG 108 08/05/2018   CHOLHDL 2.6 08/05/2018   Hepatic Function Latest Ref  Rng & Units 08/05/2018 07/12/2017 06/10/2010  Total Protein 6.0 - 8.5 g/dL 7.3 7.4 7.0  Albumin 3.7 - 4.7 g/dL 4.8(H) 4.6 4.3  AST 0 - 40 IU/L 19 22 34  ALT 0 - 32 IU/L _0 Alk Phosphatase 39 - 117 IU/L 78 66 52  Total Bilirubin 0.0 - 1.2 mg/dL 1.1 0.7 0.7        Wt Readings from Last 3 Encounters:  06/25/19 152 lb 12.8 oz (69.3 kg)  02/10/19 157 lb (71.2 kg)  08/15/18 155 lb (70.3 kg)   BP Readings from Last 3 Encounters:  06/25/19 132/78  02/10/19 125/80  09/26/18 139/89   Pulse Readings from Last 3 Encounters:  06/25/19 70  02/10/19 65  09/26/18 (!) 57   BMI Readings from Last 3 Encounters:  06/25/19 27.95 kg/m  02/10/19 27.81 kg/m  08/15/18 27.46 kg/m     Patient Care Team    Relationship Specialty Notifications Start End  Mellody Dance, DO PCP - General Family Medicine  07/04/17   Jamal Maes, MD Consulting Physician Nephrology  07/04/17      Patient Active Problem List   Diagnosis Date Noted  . Diabetes mellitus without complication ([place]) 24/49/7530  . Hypertension associated with diabetes ([place]) 07/04/2017  . Mixed diabetic hyperlipidemia associated with type 2 diabetes mellitus ([place]) 07/04/2017  . Diabetes mellitus due to underlying condition with stage 3 chronic kidney disease, without long-term current use of insulin ([place]) 07/04/2017  . Microalbuminuria due to type 2 diabetes mellitus ([place]) 07/04/2017  . 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 Vitamin D deficiency 07/16/2017  . Mood disorder ([place]) 07/04/2017    Past Medical history, Surgical history, Family history, Social history, Allergies and Medications have been entered into the medical record, reviewed and changed as needed.    Current Meds  Medication Sig  . amLODipine (NORVASC) 5 MG tablet Take 1 tablet (5 mg total) by mouth daily.  Marland Kitchen atorvastatin (LIPITOR) 20 MG tablet TAKE 1 TABLET BY MOUTH EVERYDAY AT BEDTIME  . Blood Glucose Monitoring Suppl (ONETOUCH  VERIO) w/Device KIT Use to test glucose fating in AM and 2 hours after largest meal  . Calcium Carbonate-Vit D-Min (CALCIUM 1200 PO) Take 1 tablet by mouth daily.  . Cholecalciferol (VITAMIN D-3) 5000 units TABS Take 1 tablet by mouth daily.  . citalopram (CELEXA) 20 MG tablet Take 1 tablet (20 mg total) by mouth daily.  . Lancets (ONETOUCH DELICA PLUS [id number]) MISC 2 each by Does not apply route daily.  Marland Kitchen losartan (COZAAR) 100 MG tablet Take 1 tablet (  100 mg total) by mouth daily.  . metFORMIN (GLUCOPHAGE-XR) 500 MG 24 hr tablet Take 2 tablets (1,000 mg total) by mouth daily with breakfast.  . ONETOUCH VERIO test strip USE TO CHECK FASTING GLUCOSE IN THE AM AND TO CHECK 2 HOURS AFTER LARGEST MEAL OF THE DAY.    Allergies:  Allergies  Allergen Reactions  . Penicillins Hives     Review of Systems:  A fourteen system review of systems was performed and found to be positive as per HPI.   Objective:   Blood pressure 132/78, pulse 70, temperature 98.2 F (36.8 C), temperature source Oral, resp. rate 10, height _0  (1.575 m), weight 152 lb 12.8 oz (69.3 kg), SpO2 100 %. Body mass index is 27.95 kg/m. General:  Well Developed, well nourished, appropriate for stated age.  Neuro:  Alert and oriented,  extra-ocular muscles intact  HEENT:  Normocephalic, atraumatic, neck supple, no carotid bruits appreciated  Skin:  no gross rash, warm, pink. Cardiac:  RRR, S1 S2 Respiratory:  ECTA B/L and A/P, Not using accessory muscles, speaking in full sentences- unlabored. Vascular:  Ext warm, no cyanosis apprec.; cap RF less 2 sec. Psych:  No HI/SI, judgement and insight good, Euthymic mood. Full Affect. Finger:  patient reports pain in left fourth digit in DIT joint; slight swelling to the joint area.  Second digit on left hand with swelling of PIP joint.  Enlargement of her PIP joints in her right hand, second and third digits as well.

## 2019-06-26 ENCOUNTER — Telehealth: Payer: Self-pay | Admitting: Family Medicine

## 2019-06-26 NOTE — Telephone Encounter (Signed)
Patient was seen yesterday and she said there was a discussion about a topical cream that could help her hand pain versus taking pills. She would like to move forward with this recommendation. If approved please send order to [facility] on [address].

## 2019-06-26 NOTE — Telephone Encounter (Signed)
Please review and advise. AS, CMA 

## 2019-06-27 ENCOUNTER — Other Ambulatory Visit: Payer: Self-pay | Admitting: Family Medicine

## 2019-06-27 MED ORDER — DICLOFENAC SODIUM 1 % EX GEL: 2.0000 g | Freq: Four times a day (QID) | CUTANEOUS | 0 refills | Status: DC

## 2019-06-27 NOTE — Telephone Encounter (Signed)
Patient is resting that we send in a topical ointment for pain. She states she does not want a referral to sports med.  Please advise if we can send in med or if patient needs another apt to discuss. AS, CMA

## 2019-06-27 NOTE — Addendum Note (Signed)
Addended by: Mickel Crow on: 06/27/2019 12:19 PM   Modules accepted: Orders

## 2019-06-27 NOTE — Telephone Encounter (Signed)
Voltaren gel 1%. 2 g. Apply to affected joints up to 4 times daily as needed. 1 tube with 1 refill. Please let patient know this can be expensive and often cost prohibitive for most patients.  If it is, I recommend she take Tylenol as we discussed last office visit.

## 2019-06-27 NOTE — Telephone Encounter (Signed)
I never discussed cream for pain with this pt.   Discussed if pain was bothersome enough, we could send to [facility] for  jt injections.

## 2019-06-27 NOTE — Telephone Encounter (Signed)
Med sent to pharmacy. Patient is aware of below. AS, CMA

## 2019-06-30 ENCOUNTER — Other Ambulatory Visit: Payer: Self-pay | Admitting: Family Medicine

## 2019-06-30 DIAGNOSIS — E1159 Type 2 diabetes mellitus with other circulatory complications: Secondary | ICD-10-CM

## 2019-06-30 DIAGNOSIS — E1169 Type 2 diabetes mellitus with other specified complication: Secondary | ICD-10-CM

## 2019-06-30 DIAGNOSIS — I152 Hypertension secondary to endocrine disorders: Secondary | ICD-10-CM

## 2019-06-30 DIAGNOSIS — E119 Type 2 diabetes mellitus without complications: Secondary | ICD-10-CM

## 2019-06-30 DIAGNOSIS — E782 Mixed hyperlipidemia: Secondary | ICD-10-CM

## 2019-07-01 ENCOUNTER — Other Ambulatory Visit: Payer: Self-pay | Admitting: Family Medicine

## 2019-07-01 DIAGNOSIS — I152 Hypertension secondary to endocrine disorders: Secondary | ICD-10-CM

## 2019-07-01 DIAGNOSIS — E1159 Type 2 diabetes mellitus with other circulatory complications: Secondary | ICD-10-CM

## 2019-07-08 ENCOUNTER — Other Ambulatory Visit: Payer: Self-pay | Admitting: Family Medicine

## 2019-07-08 DIAGNOSIS — F39 Unspecified mood [affective] disorder: Secondary | ICD-10-CM

## 2019-07-20 ENCOUNTER — Ambulatory Visit: Payer: Medicare Other | Attending: Internal Medicine

## 2019-07-20 DIAGNOSIS — Z23 Encounter for immunization: Secondary | ICD-10-CM | POA: Insufficient documentation

## 2019-07-20 NOTE — Progress Notes (Signed)
   Covid-19 Vaccination Clinic  Name:  Lafondra Hesterberg    MRN: [id number] DOB: 09-06-1945  07/20/2019  Ms. Staus was observed post Covid-19 immunization for 15 minutes without incidence. She was provided with Vaccine Information Sheet and instruction to access the V-Safe system.   Ms. Chin was instructed to call 911 with any severe reactions post vaccine: Marland Kitchen Difficulty breathing  . Swelling of your face and throat  . A fast heartbeat  . A bad rash all over your body  . Dizziness and weakness    Immunizations Administered    Name Date Dose VIS Date Route   Pfizer COVID-19 Vaccine 07/20/2019 12:22 PM 0.3 mL 05/02/2019 Intramuscular   Manufacturer: [place]   Lot: [id number]   [place]: [id number]

## 2019-08-06 ENCOUNTER — Other Ambulatory Visit: Payer: Self-pay | Admitting: Family Medicine

## 2019-08-06 DIAGNOSIS — E119 Type 2 diabetes mellitus without complications: Secondary | ICD-10-CM

## 2019-08-19 ENCOUNTER — Ambulatory Visit: Payer: Medicare Other | Attending: Internal Medicine

## 2019-08-19 DIAGNOSIS — Z23 Encounter for immunization: Secondary | ICD-10-CM

## 2019-08-19 NOTE — Progress Notes (Signed)
   Covid-19 Vaccination Clinic  Name:  Rachel Camacho    MRN: [id number] DOB: [birth year]  08/19/2019  [name] was observed post Covid-19 immunization for 15 minutes without incident. She was provided with Vaccine Information Sheet and instruction to access the V-Safe system.   [name] was instructed to call 911 with any severe reactions post vaccine: Marland Kitchen Difficulty breathing  . Swelling of face and throat  . A fast heartbeat  . A bad rash all over body  . Dizziness and weakness   Immunizations Administered    Name Date Dose VIS Date Route   Pfizer COVID-19 Vaccine 08/19/2019 11:05 AM 0.3 mL 05/02/2019 Intramuscular   Manufacturer: [place]   Lot: [id number]   [place]: [id number]

## 2019-08-27 ENCOUNTER — Encounter: Payer: Self-pay | Admitting: Family Medicine

## 2019-08-27 ENCOUNTER — Ambulatory Visit (INDEPENDENT_AMBULATORY_CARE_PROVIDER_SITE_OTHER): Payer: Medicare Other | Admitting: Family Medicine

## 2019-08-27 ENCOUNTER — Other Ambulatory Visit: Payer: Medicare Other

## 2019-08-27 ENCOUNTER — Other Ambulatory Visit: Payer: Self-pay

## 2019-08-27 VITALS — BP 136/87 | Ht 62.0 in | Wt 155.0 lb

## 2019-08-27 DIAGNOSIS — Z23 Encounter for immunization: Secondary | ICD-10-CM

## 2019-08-27 DIAGNOSIS — E1159 Type 2 diabetes mellitus with other circulatory complications: Secondary | ICD-10-CM

## 2019-08-27 DIAGNOSIS — Z780 Asymptomatic menopausal state: Secondary | ICD-10-CM

## 2019-08-27 DIAGNOSIS — E2839 Other primary ovarian failure: Secondary | ICD-10-CM

## 2019-08-27 DIAGNOSIS — I152 Hypertension secondary to endocrine disorders: Secondary | ICD-10-CM

## 2019-08-27 DIAGNOSIS — E782 Mixed hyperlipidemia: Secondary | ICD-10-CM

## 2019-08-27 DIAGNOSIS — Z0000 Encounter for general adult medical examination without abnormal findings: Secondary | ICD-10-CM

## 2019-08-27 DIAGNOSIS — E1169 Type 2 diabetes mellitus with other specified complication: Secondary | ICD-10-CM | POA: Diagnosis not present

## 2019-08-27 DIAGNOSIS — Z1231 Encounter for screening mammogram for malignant neoplasm of breast: Secondary | ICD-10-CM

## 2019-08-27 DIAGNOSIS — E559 Vitamin D deficiency, unspecified: Secondary | ICD-10-CM

## 2019-08-27 DIAGNOSIS — E119 Type 2 diabetes mellitus without complications: Secondary | ICD-10-CM

## 2019-08-27 DIAGNOSIS — I10 Essential (primary) hypertension: Secondary | ICD-10-CM

## 2019-08-27 LAB — BASIC METABOLIC PANEL: Glucose: 104

## 2019-08-27 MED ORDER — ZOSTER VAC RECOMB ADJUVANTED 50 MCG/0.5ML IM SUSR: 0.5000 mL | Freq: Once | INTRAMUSCULAR | 0 refills | Status: AC

## 2019-08-27 NOTE — Progress Notes (Addendum)
Subjective:   Rachel Camacho is a 74 y.o. female who presents for Medicare Annual (Subsequent) preventive examination.  Walks in park 1 hr 5 d/wk or more  Wants to go back to gym- got both vaccines  Reviewed all Health Maintenance records and are up-to-date unless otherwise noted below   Objective:    Vitals: BP 136/87   Ht '5\' 2"'  (1.575 m)   Wt 155 lb (70.3 kg)   BMI 28.35 kg/m   Body mass index is 28.35 kg/m.  Tobacco Social History   Tobacco Use  Smoking Status Never Smoker  Smokeless Tobacco Never Used     Counseling given: Not Answered     Past Medical History:  Diagnosis Date  . Depression   . Diabetes mellitus without complication ([place])   . Hyperlipidemia   . Hypertension   . 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 Intial UPC 4.7 grams. Conservative Rx. 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 Lorrene Reid)   History reviewed. No pertinent surgical history. History reviewed. No pertinent family history. Social History   Socioeconomic History  . Marital status: Divorced    Spouse name: Not on file  . Number of children: Not on file  . Years of education: Not on file  . Highest education level: Not on file  Occupational History  . Not on file  Tobacco Use  . Smoking status: Never Smoker  . Smokeless tobacco: Never Used  Substance and Sexual Activity  . Alcohol use: Yes    Comment: social  . Drug use: No  . Sexual activity: Never    Birth control/protection: None  Other Topics Concern  . Not on file  Social History Narrative  . Not on file   Social Determinants of Health   Financial Resource Strain:   . Difficulty of Paying Living Expenses:   Food Insecurity:   . Worried About Charity fundraiser in the Last Year:   . Arboriculturist in the Last Year:   Transportation Needs:   . Film/video editor (Medical):   Marland Kitchen Lack of Transportation (Non-Medical):   Physical Activity:   . Days of Exercise per  Week:   . Minutes of Exercise per Session:   Stress:   . Feeling of Stress :   Social Connections:   . Frequency of Communication with Friends and Family:   . Frequency of Social Gatherings with Friends and Family:   . Attends Religious Services:   . Active Member of Clubs or Organizations:   . Attends Archivist Meetings:   Marland Kitchen Marital Status:     Outpatient Encounter Medications as of 08/27/2019  Medication Sig  . amLODipine (NORVASC) 5 MG tablet TAKE 1 TABLET BY MOUTH  DAILY  . atorvastatin (LIPITOR) 20 MG tablet TAKE 1 TABLET BY MOUTH  DAILY AT BEDTIME  . Blood Glucose Monitoring Suppl (ONETOUCH VERIO) w/Device KIT Use to test glucose fating in AM and 2 hours after largest meal  . Calcium Carbonate-Vit D-Min (CALCIUM 1200 PO) Take 1 tablet by mouth daily.  . Cholecalciferol (VITAMIN D-3) 5000 units TABS Take 1 tablet by mouth daily.  . citalopram (CELEXA) 20 MG tablet TAKE 1 TABLET BY MOUTH  DAILY  . Lancets (ONETOUCH DELICA PLUS [id number]) MISC USE 2 DAILY AS DIRECTED  . losartan (COZAAR) 100 MG tablet TAKE 1 TABLET BY MOUTH  DAILY  . metFORMIN (GLUCOPHAGE-XR) 500 MG 24 hr tablet TAKE 2 TABLETS (1,000 MG TOTAL) BY  MOUTH DAILY AFTER SUPPER.  Glory Rosebush VERIO test strip USE TO CHECK FASTING GLUCOSE IN THE AM AND TO CHECK 2 HOURS AFTER LARGEST MEAL OF THE DAY.  Marland Kitchen diclofenac Sodium (VOLTAREN) 1 % GEL Please specify directions, refills and quantity (Patient not taking: Reported on 08/27/2019)  . [EXPIRED] Zoster Vaccine Adjuvanted [facility]) injection Inject 0.5 mLs into the muscle once for 1 dose.   No facility-administered encounter medications on file as of 08/27/2019.    Activities of Daily Living In your present state of health, do you have any difficulty performing the following activities: 08/27/2019 02/10/2019  Hearing? N N  Vision? N N  Difficulty concentrating or making decisions? N N  Walking or climbing stairs? N N  Dressing or bathing? N N  Doing errands, shopping? N  N  Some recent data might be hidden    Patient Care Team: Mellody Dance, DO as PCP - General (Family Medicine) Jamal Maes, MD as Consulting Physician (Nephrology)     Assessment:   This is a routine wellness examination for Rachel Camacho.  Exercise Activities and Dietary recommendations- continue current state    Goals   None     Fall Risk Fall Risk  08/27/2019 12/19/2017 12/19/2017 10/04/2017 07/04/2017  Falls in the past year? 0 No No No No  Number falls in past yr: 0 - - - -  Injury with Fall? 0 - - - -  Follow up Falls evaluation completed - - - -   Is the patient's home free of loose throw rugs in walkways, pet beds, electrical cords, etc?   yes      Grab bars in the bathroom? no      Handrails on the stairs?   yes      Adequate lighting?   yes  Timed Get Up and Go performed: Telehealth  Depression Screen PHQ 2/9 Scores 08/27/2019 06/25/2019 09/26/2018 05/09/2018  PHQ - 2 Score 0 2 1 0  PHQ- 9 Score '1 2 3 ' 0     Cognitive Function   6CIT Screen 08/27/2019  What Year? 0 points  What month? 0 points  What time? 0 points  Count back from 20 0 points  Months in reverse 0 points  Repeat phrase 0 points  Total Score 0    Immunization History  Administered Date(s) Administered  . Influenza, High Dose Seasonal PF 05/08/2019  . PFIZER SARS-COV-2 Vaccination 07/20/2019, 08/19/2019  . PPD Test 12/31/2017  . Pneumococcal Conjugate-13 10/04/2017  . Pneumococcal Polysaccharide-23 04/09/2018    Qualifies for Shingles Vaccine? Ordered for pharmacy TDAP: patient to schedule NV to have done in office Pneumococcal: UTD  Screening Tests Health Maintenance  Topic Date Due  . Hepatitis C Screening  Never done  . TETANUS/TDAP  Never done  . FOOT EXAM  10/05/2018  . OPHTHALMOLOGY EXAM  01/15/2019  . MAMMOGRAM  10/05/2019  . INFLUENZA VACCINE  12/21/2019  . HEMOGLOBIN A1C  12/23/2019  . COLONOSCOPY  04/24/2028  . DEXA SCAN  Completed  . PNA vac Low Risk Adult  Completed     Cancer Screenings: Lung: Low Dose CT Chest recommended if Age 89-80 years, 30 pack-year currently smoking OR have quit w/in 15years. Patient does not qualify. Breast:  Up to date on Mammogram? No  - ordered Up to date of Bone Density/Dexa? No-  Ordered Colorectal: UTD  Additional Screenings: Hepatitis C Screening: Ordered HIV Screening: Ordered     Plan:      Orders Placed This Encounter  Procedures  .  MM Digital Screening  . DG Bone Density  . CBC  . Comprehensive metabolic panel  . TSH  . T4, free  . Hemoglobin A1c  . Lipid panel  . VITAMIN D 25 Hydroxy (Vit-D Deficiency, Fractures)  . Hepatitis C Antibody  . HIV antibody (with reflex)   I have personally reviewed and noted the following in the patient's chart:   . Medical and social history . Use of alcohol, tobacco or illicit drugs  . Current medications and supplements . Functional ability and status . Nutritional status . Physical activity . Advanced directives . List of other physicians . Hospitalizations, surgeries, and ER visits in previous 12 months . Vitals . Screenings to include cognitive, depression, and falls . Referrals and appointments  In addition, I have reviewed and discussed with patient certain preventive protocols, quality metrics, and best practice recommendations. A written personalized care plan for preventive services as well as general preventive health recommendations were provided to patient.    Return for f/up early May for FBW & Tdap ( 64moafter last A1c obtained 06/26/19), then q 3-4 mo for DM, BP etc.

## 2019-09-02 ENCOUNTER — Telehealth: Payer: Self-pay | Admitting: Family Medicine

## 2019-09-02 DIAGNOSIS — F39 Unspecified mood [affective] disorder: Secondary | ICD-10-CM

## 2019-09-02 MED ORDER — CITALOPRAM HYDROBROMIDE 20 MG PO TABS: 20.0000 mg | ORAL_TABLET | Freq: Every day | ORAL | 0 refills | Status: DC

## 2019-09-02 NOTE — Addendum Note (Signed)
Addended by: Mickel Crow on: 09/02/2019 01:47 PM   Modules accepted: Orders

## 2019-09-02 NOTE — Telephone Encounter (Signed)
Refill sent to pharmacy. AS, CMA 

## 2019-09-02 NOTE — Telephone Encounter (Signed)
Patient is requesting a refill of her Celexa, if approved [facility] to [facility] [address].

## 2019-09-03 ENCOUNTER — Other Ambulatory Visit: Payer: Self-pay

## 2019-09-03 ENCOUNTER — Other Ambulatory Visit: Payer: Medicare Other

## 2019-09-03 DIAGNOSIS — M19042 Primary osteoarthritis, left hand: Secondary | ICD-10-CM

## 2019-09-03 DIAGNOSIS — E1169 Type 2 diabetes mellitus with other specified complication: Secondary | ICD-10-CM

## 2019-09-03 DIAGNOSIS — Z0000 Encounter for general adult medical examination without abnormal findings: Secondary | ICD-10-CM

## 2019-09-03 DIAGNOSIS — E119 Type 2 diabetes mellitus without complications: Secondary | ICD-10-CM

## 2019-09-03 DIAGNOSIS — E1159 Type 2 diabetes mellitus with other circulatory complications: Secondary | ICD-10-CM

## 2019-09-03 DIAGNOSIS — I152 Hypertension secondary to endocrine disorders: Secondary | ICD-10-CM

## 2019-09-03 DIAGNOSIS — E559 Vitamin D deficiency, unspecified: Secondary | ICD-10-CM

## 2019-09-03 DIAGNOSIS — N183 Chronic kidney disease, stage 3 unspecified: Secondary | ICD-10-CM

## 2019-09-03 DIAGNOSIS — M19041 Primary osteoarthritis, right hand: Secondary | ICD-10-CM

## 2019-09-03 DIAGNOSIS — I10 Essential (primary) hypertension: Secondary | ICD-10-CM | POA: Diagnosis not present

## 2019-09-03 DIAGNOSIS — F39 Unspecified mood [affective] disorder: Secondary | ICD-10-CM

## 2019-09-03 DIAGNOSIS — E0822 Diabetes mellitus due to underlying condition with diabetic chronic kidney disease: Secondary | ICD-10-CM

## 2019-09-03 DIAGNOSIS — E1129 Type 2 diabetes mellitus with other diabetic kidney complication: Secondary | ICD-10-CM

## 2019-09-03 DIAGNOSIS — R809 Proteinuria, unspecified: Secondary | ICD-10-CM

## 2019-09-04 LAB — COMPREHENSIVE METABOLIC PANEL
ALT: 11 IU/L (ref 0–32)
AST: 17 IU/L (ref 0–40)
Albumin/Globulin Ratio: 2.1 (ref 1.2–2.2)
Albumin: 4.7 g/dL (ref 3.7–4.7)
Alkaline Phosphatase: 79 IU/L (ref 39–117)
BUN/Creatinine Ratio: 16 (ref 12–28)
BUN: 13 mg/dL (ref 8–27)
Bilirubin Total: 0.6 mg/dL (ref 0.0–1.2)
CO2: 24 mmol/L (ref 20–29)
Calcium: 9.8 mg/dL (ref 8.7–10.3)
Chloride: 103 mmol/L (ref 96–106)
Creatinine, Ser: 0.8 mg/dL (ref 0.57–1.00)
GFR calc Af Amer: 84 mL/min/{1.73_m2} (ref >59)
GFR calc non Af Amer: 73 mL/min/{1.73_m2} (ref >59)
Globulin, Total: 2.2 g/dL (ref 1.5–4.5)
Glucose: 104 mg/dL — ABNORMAL HIGH (ref 65–99)
Potassium: 4.4 mmol/L (ref 3.5–5.2)
Sodium: 141 mmol/L (ref 134–144)
Total Protein: 6.9 g/dL (ref 6.0–8.5)

## 2019-09-04 LAB — MICROALBUMIN / CREATININE URINE RATIO
Creatinine, Urine: 75.1 mg/dL
Microalb/Creat Ratio: 51 mg/g creat — ABNORMAL HIGH (ref 0–29)
Microalbumin, Urine: 38 ug/mL

## 2019-09-04 LAB — LIPID PANEL
Chol/HDL Ratio: 2 ratio (ref 0.0–4.4)
Cholesterol, Total: 127 mg/dL (ref 100–199)
HDL: 65 mg/dL (ref >39)
LDL Chol Calc (NIH): 48 mg/dL (ref 0–99)
Triglycerides: 65 mg/dL (ref 0–149)
VLDL Cholesterol Cal: 14 mg/dL (ref 5–40)

## 2019-09-04 LAB — CBC
Hematocrit: 40.2 % (ref 34.0–46.6)
Hemoglobin: 13.6 g/dL (ref 11.1–15.9)
MCH: 30.4 pg (ref 26.6–33.0)
MCHC: 33.8 g/dL (ref 31.5–35.7)
MCV: 90 fL (ref 79–97)
Platelets: 255 10*3/uL (ref 150–450)
RBC: 4.47 x10E6/uL (ref 3.77–5.28)
RDW: 13.4 % (ref 11.7–15.4)
WBC: 6.4 10*3/uL (ref 3.4–10.8)

## 2019-09-04 LAB — HEMOGLOBIN A1C
Est. average glucose Bld gHb Est-mCnc: 131 mg/dL
Hgb A1c MFr Bld: 6.2 % — ABNORMAL HIGH (ref 4.8–5.6)

## 2019-09-04 LAB — HIV ANTIBODY (ROUTINE TESTING W REFLEX): HIV Screen 4th Generation wRfx: NONREACTIVE

## 2019-09-04 LAB — HEPATITIS C ANTIBODY: Hep C Virus Ab: <0.1 s/co ratio (ref 0.0–0.9)

## 2019-09-04 LAB — T4, FREE: Free T4: 0.95 ng/dL (ref 0.82–1.77)

## 2019-09-04 LAB — TSH: TSH: 2.97 u[IU]/mL (ref 0.450–4.500)

## 2019-09-04 LAB — VITAMIN D 25 HYDROXY (VIT D DEFICIENCY, FRACTURES): Vit D, 25-Hydroxy: 55 ng/mL (ref 30.0–100.0)

## 2019-09-04 LAB — VITAMIN B12: Vitamin B-12: 458 pg/mL (ref 232–1245)

## 2019-09-19 ENCOUNTER — Other Ambulatory Visit: Payer: Self-pay | Admitting: Family Medicine

## 2019-09-19 DIAGNOSIS — E1169 Type 2 diabetes mellitus with other specified complication: Secondary | ICD-10-CM

## 2019-09-19 DIAGNOSIS — I152 Hypertension secondary to endocrine disorders: Secondary | ICD-10-CM

## 2019-09-19 DIAGNOSIS — E119 Type 2 diabetes mellitus without complications: Secondary | ICD-10-CM

## 2019-09-19 DIAGNOSIS — E1159 Type 2 diabetes mellitus with other circulatory complications: Secondary | ICD-10-CM

## 2019-09-21 ENCOUNTER — Other Ambulatory Visit: Payer: Self-pay | Admitting: Family Medicine

## 2019-09-21 DIAGNOSIS — F39 Unspecified mood [affective] disorder: Secondary | ICD-10-CM

## 2019-10-30 ENCOUNTER — Other Ambulatory Visit: Payer: Self-pay

## 2019-10-30 ENCOUNTER — Ambulatory Visit (INDEPENDENT_AMBULATORY_CARE_PROVIDER_SITE_OTHER): Payer: Medicare Other | Admitting: Physician Assistant

## 2019-10-30 ENCOUNTER — Encounter: Payer: Self-pay | Admitting: Physician Assistant

## 2019-10-30 VITALS — BP 116/68 | HR 68 | Temp 98.0°F | Ht 63.0 in | Wt 150.4 lb

## 2019-10-30 DIAGNOSIS — L309 Dermatitis, unspecified: Secondary | ICD-10-CM | POA: Diagnosis not present

## 2019-10-30 MED ORDER — TRIAMCINOLONE ACETONIDE 0.1 % EX CREA: 1.0000 "application " | TOPICAL_CREAM | Freq: Two times a day (BID) | CUTANEOUS | 0 refills | Status: DC

## 2019-10-30 NOTE — Progress Notes (Signed)
Acute Office Visit  Subjective:    Patient ID: Rachel Camacho, female    DOB: 11-21-1945, 74 y.o.   MRN: [id number]  Chief Complaint  Patient presents with  . Rash    HPI Patient is in today for rash on lower extremities that started last Wednesday (8 days ago). The rash is itchy. Denies pain, fever, chills, new detergents or coming into contact with grass/plants. She has been using anti-itch cream and neosporin.   Past Medical History:  Diagnosis Date  . Depression   . Diabetes mellitus without complication ([place])   . Hyperlipidemia   . Hypertension   . 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 Intial UPC 4.7 grams. Conservative Rx. 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 Lorrene Reid)    History reviewed. No pertinent surgical history.  History reviewed. No pertinent family history.  Social History   Socioeconomic History  . Marital status: Divorced    Spouse name: Not on file  . Number of children: Not on file  . Years of education: Not on file  . Highest education level: Not on file  Occupational History  . Not on file  Tobacco Use  . Smoking status: Never Smoker  . Smokeless tobacco: Never Used  Vaping Use  . Vaping Use: Never used  Substance and Sexual Activity  . Alcohol use: Yes    Comment: social  . Drug use: No  . Sexual activity: Never    Birth control/protection: None  Other Topics Concern  . Not on file  Social History Narrative  . Not on file   Social Determinants of Health   Financial Resource Strain:   . Difficulty of Paying Living Expenses:   Food Insecurity:   . Worried About Charity fundraiser in the Last Year:   . Arboriculturist in the Last Year:   Transportation Needs:   . Film/video editor (Medical):   Marland Kitchen Lack of Transportation (Non-Medical):   Physical Activity:   . Days of Exercise per Week:   . Minutes of Exercise per Session:   Stress:   . Feeling of Stress :   Social Connections:    . Frequency of Communication with Friends and Family:   . Frequency of Social Gatherings with Friends and Family:   . Attends Religious Services:   . Active Member of Clubs or Organizations:   . Attends Archivist Meetings:   Marland Kitchen Marital Status:   Intimate Partner Violence:   . Fear of Current or Ex-Partner:   . Emotionally Abused:   Marland Kitchen Physically Abused:   . Sexually Abused:     Outpatient Medications Prior to Visit  Medication Sig Dispense Refill  . amLODipine (NORVASC) 5 MG tablet TAKE 1 TABLET BY MOUTH  DAILY 90 tablet 0  . atorvastatin (LIPITOR) 20 MG tablet TAKE 1 TABLET BY MOUTH  DAILY AT BEDTIME 90 tablet 0  . Blood Glucose Monitoring Suppl (ONETOUCH VERIO) w/Device KIT Use to test glucose fating in AM and 2 hours after largest meal 1 kit 0  . Calcium Carbonate-Vit D-Min (CALCIUM 1200 PO) Take 1 tablet by mouth daily.    . Cholecalciferol (VITAMIN D-3) 5000 units TABS Take 1 tablet by mouth daily. 30 tablet   . citalopram (CELEXA) 20 MG tablet Take 1 tablet (20 mg total) by mouth daily. 90 tablet 0  . diclofenac Sodium (VOLTAREN) 1 % GEL Please specify directions, refills and quantity 100 g 0  .  Lancets (ONETOUCH DELICA PLUS [id number]) MISC USE 2 DAILY AS DIRECTED 200 each 3  . losartan (COZAAR) 100 MG tablet TAKE 1 TABLET BY MOUTH  DAILY 90 tablet 0  . metFORMIN (GLUCOPHAGE-XR) 500 MG 24 hr tablet TAKE 2 TABLETS (1,000 MG TOTAL) BY MOUTH DAILY AFTER SUPPER. 180 tablet 1  . ONETOUCH VERIO test strip USE TO CHECK FASTING GLUCOSE IN THE AM AND TO CHECK 2 HOURS AFTER LARGEST MEAL OF THE DAY. 100 each 12   No facility-administered medications prior to visit.    Allergies  Allergen Reactions  . Penicillins Hives    Review of Systems Review of Systems:  A fourteen system review of systems was performed and found to be positive as per HPI.     Objective:    Physical Exam General:  Well Developed, well nourished, appropriate for stated age.  Neuro:  Alert and  oriented,  extra-ocular muscles intact  HEENT:  Normocephalic, atraumatic Skin:  Vesicular rash on B/L lower extremities.  Respiratory:  ECTA B/L and A/P, Not using accessory muscles, speaking in full sentences- unlabored. Vascular:  Ext warm, no cyanosis apprec. Psych:  No HI/SI, judgement and insight good, Euthymic mood. Full Affect.  BP 116/68   Pulse 68   Temp 98 F (36.7 C) (Oral)   Ht '5\' 3"'$  (1.6 m)   Wt 150 lb 6.4 oz (68.2 kg)   SpO2 100%   BMI 26.64 kg/m  Wt Readings from Last 3 Encounters:  10/30/19 150 lb 6.4 oz (68.2 kg)  08/27/19 155 lb (70.3 kg)  06/25/19 152 lb 12.8 oz (69.3 kg)    Health Maintenance Due  Topic Date Due  . TETANUS/TDAP  Never done  . FOOT EXAM  10/05/2018  . OPHTHALMOLOGY EXAM  01/15/2019  . MAMMOGRAM  10/05/2019    There are no preventive care reminders to display for this patient.   Lab Results  Component Value Date   TSH 2.970 09/03/2019   Lab Results  Component Value Date   WBC 6.4 09/03/2019   HGB 13.6 09/03/2019   HCT 40.2 09/03/2019   MCV 90 09/03/2019   PLT 255 09/03/2019   Lab Results  Component Value Date   NA 141 09/03/2019   K 4.4 09/03/2019   CO2 24 09/03/2019   GLUCOSE 104 (H) 09/03/2019   BUN 13 09/03/2019   CREATININE 0.80 09/03/2019   BILITOT 0.6 09/03/2019   ALKPHOS 79 09/03/2019   AST 17 09/03/2019   ALT 11 09/03/2019   PROT 6.9 09/03/2019   ALBUMIN 4.7 09/03/2019   CALCIUM 9.8 09/03/2019   Lab Results  Component Value Date   CHOL 127 09/03/2019   Lab Results  Component Value Date   HDL 65 09/03/2019   Lab Results  Component Value Date   LDLCALC 48 09/03/2019   Lab Results  Component Value Date   TRIG 65 09/03/2019   Lab Results  Component Value Date   CHOLHDL 2.0 09/03/2019   Lab Results  Component Value Date   HGBA1C 6.2 (H) 09/03/2019       Assessment & Plan:   Problem List Items Addressed This Visit    None    Visit Diagnoses    Dermatitis    -  Primary   Relevant  Medications   triamcinolone cream (KENALOG) 0.1 %       Meds ordered this encounter  Medications  . triamcinolone cream (KENALOG) 0.1 %    Sig: Apply 1 application topically 2 (two) times daily.  Dispense:  30 g    Refill:  0    Order Specific Question:   Supervising Provider    Answer:   Beatrice Lecher D [[id number]]   Dermatitis: - Rash appearance is suggestive of dermatitis. Rash is non-painful and on both lower extremities so less likely Shingles, no scales present to suggest eczema or another etiology.  - Start triamcinolone cream and use BID prn - Advised patient to let me know if rash fails to improve or worsens.     Lorrene Reid, PA-C

## 2019-11-02 ENCOUNTER — Other Ambulatory Visit: Payer: Self-pay | Admitting: Family Medicine

## 2019-11-02 DIAGNOSIS — E119 Type 2 diabetes mellitus without complications: Secondary | ICD-10-CM

## 2019-11-06 ENCOUNTER — Other Ambulatory Visit: Payer: Self-pay | Admitting: Family Medicine

## 2019-11-06 DIAGNOSIS — F39 Unspecified mood [affective] disorder: Secondary | ICD-10-CM

## 2019-12-09 ENCOUNTER — Other Ambulatory Visit: Payer: Self-pay | Admitting: Physician Assistant

## 2019-12-09 DIAGNOSIS — I152 Hypertension secondary to endocrine disorders: Secondary | ICD-10-CM

## 2019-12-09 DIAGNOSIS — E1159 Type 2 diabetes mellitus with other circulatory complications: Secondary | ICD-10-CM

## 2019-12-09 DIAGNOSIS — E1169 Type 2 diabetes mellitus with other specified complication: Secondary | ICD-10-CM

## 2019-12-16 ENCOUNTER — Other Ambulatory Visit: Payer: Self-pay

## 2019-12-16 ENCOUNTER — Ambulatory Visit
Admission: RE | Admit: 2019-12-16 | Discharge: 2019-12-16 | Disposition: A | Discharge status: Home / Self Care | Payer: Medicare Other | Source: Ambulatory Visit | Attending: Family Medicine | Admitting: Family Medicine

## 2019-12-16 DIAGNOSIS — Z780 Asymptomatic menopausal state: Secondary | ICD-10-CM

## 2019-12-16 DIAGNOSIS — Z1231 Encounter for screening mammogram for malignant neoplasm of breast: Secondary | ICD-10-CM | POA: Diagnosis not present

## 2019-12-16 DIAGNOSIS — E2839 Other primary ovarian failure: Secondary | ICD-10-CM

## 2019-12-16 DIAGNOSIS — M8589 Other specified disorders of bone density and structure, multiple sites: Secondary | ICD-10-CM | POA: Diagnosis not present

## 2019-12-17 ENCOUNTER — Telehealth: Payer: Self-pay | Admitting: Physician Assistant

## 2019-12-17 DIAGNOSIS — I152 Hypertension secondary to endocrine disorders: Secondary | ICD-10-CM

## 2019-12-17 DIAGNOSIS — F39 Unspecified mood [affective] disorder: Secondary | ICD-10-CM

## 2019-12-17 DIAGNOSIS — E1159 Type 2 diabetes mellitus with other circulatory complications: Secondary | ICD-10-CM

## 2019-12-17 DIAGNOSIS — E1169 Type 2 diabetes mellitus with other specified complication: Secondary | ICD-10-CM

## 2019-12-17 NOTE — Telephone Encounter (Signed)
Patient called in requesting refills on Lipitor, Cozaar, Celexa. Please send to [facility]. Thanks

## 2019-12-18 MED ORDER — CITALOPRAM HYDROBROMIDE 20 MG PO TABS: 20.0000 mg | ORAL_TABLET | Freq: Every day | ORAL | 0 refills | Status: DC

## 2019-12-18 MED ORDER — LOSARTAN POTASSIUM 100 MG PO TABS: 100.0000 mg | ORAL_TABLET | Freq: Every day | ORAL | 0 refills | Status: DC

## 2019-12-18 MED ORDER — ATORVASTATIN CALCIUM 20 MG PO TABS: 20.0000 mg | ORAL_TABLET | Freq: Every day | ORAL | 0 refills | Status: DC

## 2019-12-18 NOTE — Telephone Encounter (Signed)
#  60 day supply of meds sent to requested pharmacy.   Patient needs apt for chronic follow up for future refills.   Please contact patient to schedule. AS, CMA

## 2019-12-18 NOTE — Addendum Note (Signed)
Addended by: Mickel Crow on: 12/18/2019 09:14 AM   Modules accepted: Orders

## 2020-01-15 ENCOUNTER — Other Ambulatory Visit: Payer: Self-pay | Admitting: Physician Assistant

## 2020-01-15 DIAGNOSIS — F39 Unspecified mood [affective] disorder: Secondary | ICD-10-CM

## 2020-01-21 ENCOUNTER — Encounter: Payer: Self-pay | Admitting: Physician Assistant

## 2020-01-21 ENCOUNTER — Other Ambulatory Visit: Payer: Self-pay

## 2020-01-21 ENCOUNTER — Ambulatory Visit (INDEPENDENT_AMBULATORY_CARE_PROVIDER_SITE_OTHER): Payer: Medicare Other | Admitting: Physician Assistant

## 2020-01-21 VITALS — BP 136/73 | HR 64 | Ht 63.0 in | Wt 152.1 lb

## 2020-01-21 DIAGNOSIS — E1169 Type 2 diabetes mellitus with other specified complication: Secondary | ICD-10-CM

## 2020-01-21 DIAGNOSIS — E0822 Diabetes mellitus due to underlying condition with diabetic chronic kidney disease: Secondary | ICD-10-CM

## 2020-01-21 DIAGNOSIS — M8580 Other specified disorders of bone density and structure, unspecified site: Secondary | ICD-10-CM

## 2020-01-21 DIAGNOSIS — I10 Essential (primary) hypertension: Secondary | ICD-10-CM

## 2020-01-21 DIAGNOSIS — N183 Chronic kidney disease, stage 3 unspecified: Secondary | ICD-10-CM

## 2020-01-21 DIAGNOSIS — E1159 Type 2 diabetes mellitus with other circulatory complications: Secondary | ICD-10-CM

## 2020-01-21 DIAGNOSIS — I152 Hypertension secondary to endocrine disorders: Secondary | ICD-10-CM

## 2020-01-21 DIAGNOSIS — E782 Mixed hyperlipidemia: Secondary | ICD-10-CM

## 2020-01-21 LAB — POCT GLYCOSYLATED HEMOGLOBIN (HGB A1C): Hemoglobin A1C: 6.7 % — AB (ref 4.0–5.6)

## 2020-01-21 NOTE — Assessment & Plan Note (Signed)
-  Last lipid panel WNL -Continue Lipitor. -Follow a heart healthy diet.

## 2020-01-21 NOTE — Patient Instructions (Signed)

## 2020-01-21 NOTE — Progress Notes (Signed)
Established Patient Office Visit  Subjective:  Patient ID: Rachel Camacho, female    DOB: January 28, 1946  Age: 74 y.o. MRN: [id number]  CC:  Chief Complaint  Patient presents with  . Hypertension  . Diabetes  . Hyperlipidemia    HPI Rachel Camacho presents for diabetes mellitus, hypertension, and hyperlipidemia.  Diabetes: Pt denies increased urination or thirst. Pt reports medication compliance. No hypoglycemic events. Checking glucose at home sometimes. FBS range in 105-110. Reports she has been snacking more and experienced increased stress.  HTN: Pt denies chest pain, palpitations, dizziness or leg swelling. Taking medication as directed without side effects. Checks BP at home and readings range 117-130/80. Reports good hydration. She has started to be more active.  HLD: Pt taking medication as directed without issues. Denies side effects including myalgias and RUQ pain.    Past Medical History:  Diagnosis Date  . Depression   . Diabetes mellitus without complication ([place])   . Hyperlipidemia   . Hypertension   . 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 Intial UPC 4.7 grams. Conservative Rx. 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 Lorrene Reid)    History reviewed. No pertinent surgical history.  History reviewed. No pertinent family history.  Social History   Socioeconomic History  . Marital status: Divorced    Spouse name: Not on file  . Number of children: Not on file  . Years of education: Not on file  . Highest education level: Not on file  Occupational History  . Not on file  Tobacco Use  . Smoking status: Never Smoker  . Smokeless tobacco: Never Used  Vaping Use  . Vaping Use: Never used  Substance and Sexual Activity  . Alcohol use: Yes    Comment: social  . Drug use: No  . Sexual activity: Never    Birth control/protection: None  Other Topics Concern  . Not on file  Social History Narrative  . Not on file    Social Determinants of Health   Financial Resource Strain:   . Difficulty of Paying Living Expenses: Not on file  Food Insecurity:   . Worried About Charity fundraiser in the Last Year: Not on file  . Ran Out of Food in the Last Year: Not on file  Transportation Needs:   . Lack of Transportation (Medical): Not on file  . Lack of Transportation (Non-Medical): Not on file  Physical Activity:   . Days of Exercise per Week: Not on file  . Minutes of Exercise per Session: Not on file  Stress:   . Feeling of Stress : Not on file  Social Connections:   . Frequency of Communication with Friends and Family: Not on file  . Frequency of Social Gatherings with Friends and Family: Not on file  . Attends Religious Services: Not on file  . Active Member of Clubs or Organizations: Not on file  . Attends Archivist Meetings: Not on file  . Marital Status: Not on file  Intimate Partner Violence:   . Fear of Current or Ex-Partner: Not on file  . Emotionally Abused: Not on file  . Physically Abused: Not on file  . Sexually Abused: Not on file    Outpatient Medications Prior to Visit  Medication Sig Dispense Refill  . amLODipine (NORVASC) 5 MG tablet TAKE 1 TABLET BY MOUTH  DAILY 90 tablet 0  . atorvastatin (LIPITOR) 20 MG tablet Take 1 tablet (20 mg total) by mouth at  bedtime. **PATIENT NEEDS APT FOR FURTHER REFILLS** 60 tablet 0  . Blood Glucose Monitoring Suppl (ONETOUCH VERIO) w/Device KIT Use to test glucose fating in AM and 2 hours after largest meal 1 kit 0  . Calcium Carbonate-Vit D-Min (CALCIUM 1200 PO) Take 1 tablet by mouth daily.    . Cholecalciferol (VITAMIN D-3) 5000 units TABS Take 1 tablet by mouth daily. 30 tablet   . citalopram (CELEXA) 20 MG tablet TAKE 1 TABLET BY MOUTH  DAILY 60 tablet 0  . diclofenac Sodium (VOLTAREN) 1 % GEL Please specify directions, refills and quantity 100 g 0  . Lancets (ONETOUCH DELICA PLUS [id number]) MISC USE 2 DAILY AS DIRECTED 200 each 3   . losartan (COZAAR) 100 MG tablet Take 1 tablet (100 mg total) by mouth daily. **PATIENT NEEDS APT FOR FURTHER REFILLS** 60 tablet 0  . metFORMIN (GLUCOPHAGE-XR) 500 MG 24 hr tablet TAKE 2 TABLETS (1,000 MG TOTAL) BY MOUTH DAILY AFTER SUPPER. 180 tablet 1  . ONETOUCH VERIO test strip USE TO CHECK FASTING GLUCOSE IN THE AM AND TO CHECK 2 HOURS AFTER LARGEST MEAL OF THE DAY. 100 each 12  . triamcinolone cream (KENALOG) 0.1 % Apply 1 application topically 2 (two) times daily. 30 g 0   No facility-administered medications prior to visit.    Allergies  Allergen Reactions  . Penicillins Hives    ROS Review of Systems  A fourteen system review of systems was performed and found to be positive as per HPI.  Objective:    Physical Exam General:  Well Developed, well nourished, appropriate for stated age.  Neuro:  Alert and oriented,  extra-ocular muscles intact  HEENT:  Normocephalic, atraumatic, neck supple  Skin:  no gross rash, warm, pink. Cardiac:  RRR, S1 S2 Respiratory:  ECTA B/L and A/P, Not using accessory muscles, speaking in full sentences- unlabored. Vascular:  Ext warm, no cyanosis apprec.; cap RF less 2 sec. No gross edema Psych:  No HI/SI, judgement and insight good, Euthymic mood. Full Affect.   BP 136/73   Pulse 64   Ht '5\' 3"'  (1.6 m)   Wt 152 lb 1.6 oz (69 kg)   SpO2 99%   BMI 26.94 kg/m  Wt Readings from Last 3 Encounters:  01/21/20 152 lb 1.6 oz (69 kg)  10/30/19 150 lb 6.4 oz (68.2 kg)  08/27/19 155 lb (70.3 kg)     Health Maintenance Due  Topic Date Due  . TETANUS/TDAP  Never done  . FOOT EXAM  10/05/2018  . OPHTHALMOLOGY EXAM  01/15/2019  . INFLUENZA VACCINE  12/21/2019    There are no preventive care reminders to display for this patient.  Lab Results  Component Value Date   TSH 2.970 09/03/2019   Lab Results  Component Value Date   WBC 6.4 09/03/2019   HGB 13.6 09/03/2019   HCT 40.2 09/03/2019   MCV 90 09/03/2019   PLT 255 09/03/2019    Lab Results  Component Value Date   NA 141 09/03/2019   K 4.4 09/03/2019   CO2 24 09/03/2019   GLUCOSE 104 (H) 09/03/2019   BUN 13 09/03/2019   CREATININE 0.80 09/03/2019   BILITOT 0.6 09/03/2019   ALKPHOS 79 09/03/2019   AST 17 09/03/2019   ALT 11 09/03/2019   PROT 6.9 09/03/2019   ALBUMIN 4.7 09/03/2019   CALCIUM 9.8 09/03/2019   Lab Results  Component Value Date   CHOL 127 09/03/2019   Lab Results  Component Value Date  HDL 65 09/03/2019   Lab Results  Component Value Date   LDLCALC 48 09/03/2019   Lab Results  Component Value Date   TRIG 65 09/03/2019   Lab Results  Component Value Date   CHOLHDL 2.0 09/03/2019   Lab Results  Component Value Date   HGBA1C 6.7 (A) 01/21/2020      Assessment & Plan:   Problem List Items Addressed This Visit      Cardiovascular and Mediastinum   Hypertension associated with diabetes ([place]) (Chronic)    -BP stable -Continue current medication regimen. -Follow low-sodium diet. -Continue to stay active and well-hydrated.        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 -Continue current medication regimen. -Will continue to monitor.      Relevant Orders   POCT glycosylated hemoglobin (Hb A1C) (Completed)   Mixed diabetic hyperlipidemia associated with type 2 diabetes mellitus ([facility]) (Chronic)    -Last lipid panel WNL -Continue Lipitor. -Follow a heart healthy diet.       Other Visit Diagnoses    Osteopenia, unspecified location         Osteopenia: -Reassurance provided and advised to continue with Vitamin D supplement and take Calcium 1200 mg.  No orders of the defined types were placed in this encounter.   Follow-up: Return in about 3 months (around 04/21/2020) for DM, HTN,  HLD; FBW inc Vit D in 2-4 weeks.    Lorrene Reid, PA-C

## 2020-01-21 NOTE — Assessment & Plan Note (Signed)
-  A1c today 6.7, stable -Discussed with patient decreasing snacks and monitoring carbohydrates and glucose. -Continue current medication regimen. -Will continue to monitor.

## 2020-01-21 NOTE — Assessment & Plan Note (Signed)
-  BP stable -Continue current medication regimen. -Follow low-sodium diet. -Continue to stay active and well-hydrated.

## 2020-02-05 ENCOUNTER — Telehealth: Payer: Self-pay | Admitting: Physician Assistant

## 2020-02-05 DIAGNOSIS — E119 Type 2 diabetes mellitus without complications: Secondary | ICD-10-CM

## 2020-02-05 MED ORDER — METFORMIN HCL ER 500 MG PO TB24: 1000.0000 mg | ORAL_TABLET | Freq: Every day | ORAL | 0 refills | Status: DC

## 2020-02-05 NOTE — Telephone Encounter (Signed)
#  30 day supply sent to [facility]. #60 day supply sent to [facility]. AS, CMA

## 2020-02-05 NOTE — Telephone Encounter (Signed)
Patient left message on voicemail.  She is out of her metformin.  She states [facility] contacted the office but they never received a return phone call.  Patient is now completely out of metformin.  She would like prescriptions [facility] to [facility] [address] So she can pick it up today.  Please let patient know if there are any issues with doing this.    metFORMIN (GLUCOPHAGE-XR) 500 MG 24 hr tablet [[id number]]   Order Details Dose, Route, Frequency: As Directed  Dispense Quantity: 180 tablet Refills: 1       Sig: TAKE 2 TABLETS (1,000 MG TOTAL) BY MOUTH DAILY AFTER SUPPER.      Start Date: 08/06/19 End Date: --  Written Date: 08/06/19 Expiration Date: 08/05/20    Diagnosis Association: Diabetes mellitus without complication ([place]) (O37.2)  Original Order:  metFORMIN (GLUCOPHAGE-XR) 500 MG 24 hr tablet [[id number]]  Providers

## 2020-02-09 ENCOUNTER — Other Ambulatory Visit: Payer: Self-pay | Admitting: Physician Assistant

## 2020-02-09 DIAGNOSIS — E559 Vitamin D deficiency, unspecified: Secondary | ICD-10-CM

## 2020-02-09 DIAGNOSIS — R809 Proteinuria, unspecified: Secondary | ICD-10-CM

## 2020-02-09 DIAGNOSIS — E1159 Type 2 diabetes mellitus with other circulatory complications: Secondary | ICD-10-CM

## 2020-02-09 DIAGNOSIS — I152 Hypertension secondary to endocrine disorders: Secondary | ICD-10-CM

## 2020-02-09 DIAGNOSIS — E119 Type 2 diabetes mellitus without complications: Secondary | ICD-10-CM

## 2020-02-09 DIAGNOSIS — E1169 Type 2 diabetes mellitus with other specified complication: Secondary | ICD-10-CM

## 2020-02-10 ENCOUNTER — Other Ambulatory Visit: Payer: Self-pay

## 2020-02-10 ENCOUNTER — Other Ambulatory Visit: Payer: Medicare Other

## 2020-02-10 DIAGNOSIS — E559 Vitamin D deficiency, unspecified: Secondary | ICD-10-CM | POA: Diagnosis not present

## 2020-02-10 DIAGNOSIS — E119 Type 2 diabetes mellitus without complications: Secondary | ICD-10-CM | POA: Diagnosis not present

## 2020-02-10 DIAGNOSIS — E1159 Type 2 diabetes mellitus with other circulatory complications: Secondary | ICD-10-CM

## 2020-02-10 DIAGNOSIS — E1129 Type 2 diabetes mellitus with other diabetic kidney complication: Secondary | ICD-10-CM | POA: Diagnosis not present

## 2020-02-10 DIAGNOSIS — I152 Hypertension secondary to endocrine disorders: Secondary | ICD-10-CM

## 2020-02-10 DIAGNOSIS — I10 Essential (primary) hypertension: Secondary | ICD-10-CM | POA: Diagnosis not present

## 2020-02-10 DIAGNOSIS — R809 Proteinuria, unspecified: Secondary | ICD-10-CM

## 2020-02-10 DIAGNOSIS — E1169 Type 2 diabetes mellitus with other specified complication: Secondary | ICD-10-CM

## 2020-02-11 LAB — LIPID PANEL
Chol/HDL Ratio: 2.1 ratio (ref 0.0–4.4)
Cholesterol, Total: 122 mg/dL (ref 100–199)
HDL: 58 mg/dL (ref >39)
LDL Chol Calc (NIH): 50 mg/dL (ref 0–99)
Triglycerides: 64 mg/dL (ref 0–149)
VLDL Cholesterol Cal: 14 mg/dL (ref 5–40)

## 2020-02-11 LAB — COMPREHENSIVE METABOLIC PANEL
ALT: 14 IU/L (ref 0–32)
AST: 18 IU/L (ref 0–40)
Albumin/Globulin Ratio: 1.8 (ref 1.2–2.2)
Albumin: 4.3 g/dL (ref 3.7–4.7)
Alkaline Phosphatase: 70 IU/L (ref 44–121)
BUN/Creatinine Ratio: 18 (ref 12–28)
BUN: 15 mg/dL (ref 8–27)
Bilirubin Total: 0.6 mg/dL (ref 0.0–1.2)
CO2: 24 mmol/L (ref 20–29)
Calcium: 9.1 mg/dL (ref 8.7–10.3)
Chloride: 104 mmol/L (ref 96–106)
Creatinine, Ser: 0.82 mg/dL (ref 0.57–1.00)
GFR calc Af Amer: 81 mL/min/{1.73_m2} (ref >59)
GFR calc non Af Amer: 70 mL/min/{1.73_m2} (ref >59)
Globulin, Total: 2.4 g/dL (ref 1.5–4.5)
Glucose: 94 mg/dL (ref 65–99)
Potassium: 4.2 mmol/L (ref 3.5–5.2)
Sodium: 141 mmol/L (ref 134–144)
Total Protein: 6.7 g/dL (ref 6.0–8.5)

## 2020-02-11 LAB — CBC
Hematocrit: 39.4 % (ref 34.0–46.6)
Hemoglobin: 13 g/dL (ref 11.1–15.9)
MCH: 29.2 pg (ref 26.6–33.0)
MCHC: 33 g/dL (ref 31.5–35.7)
MCV: 89 fL (ref 79–97)
Platelets: 296 10*3/uL (ref 150–450)
RBC: 4.45 x10E6/uL (ref 3.77–5.28)
RDW: 13.1 % (ref 11.7–15.4)
WBC: 6.7 10*3/uL (ref 3.4–10.8)

## 2020-02-11 LAB — TSH: TSH: 2.22 u[IU]/mL (ref 0.450–4.500)

## 2020-02-11 LAB — VITAMIN D 25 HYDROXY (VIT D DEFICIENCY, FRACTURES): Vit D, 25-Hydroxy: 46.3 ng/mL (ref 30.0–100.0)

## 2020-02-11 LAB — HEMOGLOBIN A1C
Est. average glucose Bld gHb Est-mCnc: 134 mg/dL
Hgb A1c MFr Bld: 6.3 % — ABNORMAL HIGH (ref 4.8–5.6)

## 2020-02-16 ENCOUNTER — Other Ambulatory Visit: Payer: Self-pay | Admitting: Physician Assistant

## 2020-02-16 DIAGNOSIS — I152 Hypertension secondary to endocrine disorders: Secondary | ICD-10-CM

## 2020-02-16 DIAGNOSIS — E1159 Type 2 diabetes mellitus with other circulatory complications: Secondary | ICD-10-CM

## 2020-03-03 ENCOUNTER — Other Ambulatory Visit: Payer: Self-pay | Admitting: Physician Assistant

## 2020-03-03 DIAGNOSIS — I152 Hypertension secondary to endocrine disorders: Secondary | ICD-10-CM

## 2020-03-03 DIAGNOSIS — E1159 Type 2 diabetes mellitus with other circulatory complications: Secondary | ICD-10-CM

## 2020-03-12 ENCOUNTER — Other Ambulatory Visit: Payer: Self-pay | Admitting: Physician Assistant

## 2020-03-12 DIAGNOSIS — F39 Unspecified mood [affective] disorder: Secondary | ICD-10-CM

## 2020-04-02 DIAGNOSIS — E119 Type 2 diabetes mellitus without complications: Secondary | ICD-10-CM | POA: Diagnosis not present

## 2020-04-02 DIAGNOSIS — H25013 Cortical age-related cataract, bilateral: Secondary | ICD-10-CM | POA: Diagnosis not present

## 2020-04-02 DIAGNOSIS — H524 Presbyopia: Secondary | ICD-10-CM | POA: Diagnosis not present

## 2020-04-02 DIAGNOSIS — H2513 Age-related nuclear cataract, bilateral: Secondary | ICD-10-CM | POA: Diagnosis not present

## 2020-04-02 LAB — HM DIABETES EYE EXAM

## 2020-04-05 ENCOUNTER — Encounter: Payer: Self-pay | Admitting: Physician Assistant

## 2020-04-28 ENCOUNTER — Other Ambulatory Visit: Payer: Self-pay

## 2020-04-28 ENCOUNTER — Ambulatory Visit (INDEPENDENT_AMBULATORY_CARE_PROVIDER_SITE_OTHER): Payer: Medicare Other | Admitting: Physician Assistant

## 2020-04-28 ENCOUNTER — Encounter: Payer: Self-pay | Admitting: Physician Assistant

## 2020-04-28 VITALS — BP 109/70 | HR 75 | Ht 62.0 in | Wt 154.9 lb

## 2020-04-28 DIAGNOSIS — E118 Type 2 diabetes mellitus with unspecified complications: Secondary | ICD-10-CM

## 2020-04-28 DIAGNOSIS — E119 Type 2 diabetes mellitus without complications: Secondary | ICD-10-CM

## 2020-04-28 DIAGNOSIS — E782 Mixed hyperlipidemia: Secondary | ICD-10-CM

## 2020-04-28 DIAGNOSIS — F39 Unspecified mood [affective] disorder: Secondary | ICD-10-CM | POA: Diagnosis not present

## 2020-04-28 DIAGNOSIS — R55 Syncope and collapse: Secondary | ICD-10-CM

## 2020-04-28 DIAGNOSIS — E1169 Type 2 diabetes mellitus with other specified complication: Secondary | ICD-10-CM

## 2020-04-28 DIAGNOSIS — L2082 Flexural eczema: Secondary | ICD-10-CM

## 2020-04-28 DIAGNOSIS — I152 Hypertension secondary to endocrine disorders: Secondary | ICD-10-CM

## 2020-04-28 DIAGNOSIS — E1159 Type 2 diabetes mellitus with other circulatory complications: Secondary | ICD-10-CM | POA: Diagnosis not present

## 2020-04-28 LAB — POCT GLYCOSYLATED HEMOGLOBIN (HGB A1C): Hemoglobin A1C: 6.5 % — AB (ref 4.0–5.6)

## 2020-04-28 MED ORDER — LOSARTAN POTASSIUM 100 MG PO TABS: 100.0000 mg | ORAL_TABLET | Freq: Every day | ORAL | 1 refills | Status: DC

## 2020-04-28 MED ORDER — METFORMIN HCL ER 500 MG PO TB24: 1000.0000 mg | ORAL_TABLET | Freq: Every day | ORAL | 1 refills | Status: DC

## 2020-04-28 MED ORDER — ATORVASTATIN CALCIUM 20 MG PO TABS: 20.0000 mg | ORAL_TABLET | Freq: Every day | ORAL | 1 refills | Status: DC

## 2020-04-28 NOTE — Patient Instructions (Signed)
DASH Eating Plan DASH stands for "Dietary Approaches to Stop Hypertension." The DASH eating plan is a healthy eating plan that has been shown to reduce high blood pressure (hypertension). It may also reduce your risk for type 2 diabetes, heart disease, and stroke. The DASH eating plan may also help with weight loss. What are tips for following this plan?  General guidelines  Avoid eating more than 2,300 mg (milligrams) of salt (sodium) a day. If you have hypertension, you may need to reduce your sodium intake to 1,500 mg a day.  Limit alcohol intake to no more than 1 drink a day for nonpregnant women and 2 drinks a day for men. One drink equals 12 oz of beer, 5 oz of wine, or 1 oz of hard liquor.  Work with your health care provider to maintain a healthy body weight or to lose weight. Ask what an ideal weight is for you.  Get at least 30 minutes of exercise that causes your heart to beat faster (aerobic exercise) most days of the week. Activities may include walking, swimming, or biking.  Work with your health care provider or diet and nutrition specialist (dietitian) to adjust your eating plan to your individual calorie needs. Reading food labels   Check food labels for the amount of sodium per serving. Choose foods with less than 5 percent of the Daily Value of sodium. Generally, foods with less than 300 mg of sodium per serving fit into this eating plan.  To find whole grains, look for the word "whole" as the first word in the ingredient list. Shopping  Buy products labeled as "low-sodium" or "no salt added."  Buy fresh foods. Avoid canned foods and premade or frozen meals. Cooking  Avoid adding salt when cooking. Use salt-free seasonings or herbs instead of table salt or sea salt. Check with your health care provider or pharmacist before using salt substitutes.  Do not fry foods. Cook foods using healthy methods such as baking, boiling, grilling, and broiling instead.  Cook with  heart-healthy oils, such as olive, canola, soybean, or sunflower oil. Meal planning  Eat a balanced diet that includes: ? 5 or more servings of fruits and vegetables each day. At each meal, try to fill half of your plate with fruits and vegetables. ? Up to 6-8 servings of whole grains each day. ? Less than 6 oz of lean meat, poultry, or fish each day. A 3-oz serving of meat is about the same size as a deck of cards. One egg equals 1 oz. ? 2 servings of low-fat dairy each day. ? A serving of nuts, seeds, or beans 5 times each week. ? Heart-healthy fats. Healthy fats called Omega-3 fatty acids are found in foods such as flaxseeds and coldwater fish, like sardines, salmon, and mackerel.  Limit how much you eat of the following: ? Canned or prepackaged foods. ? Food that is high in trans fat, such as fried foods. ? Food that is high in saturated fat, such as fatty meat. ? Sweets, desserts, sugary drinks, and other foods with added sugar. ? Full-fat dairy products.  Do not salt foods before eating.  Try to eat at least 2 vegetarian meals each week.  Eat more home-cooked food and less restaurant, buffet, and fast food.  When eating at a restaurant, ask that your food be prepared with less salt or no salt, if possible. What foods are recommended? The items listed may not be a complete list. Talk with your dietitian about   what dietary choices are best for you. Grains Whole-grain or whole-wheat bread. Whole-grain or whole-wheat pasta. Brown rice. Oatmeal. Quinoa. Bulgur. Whole-grain and low-sodium cereals. Pita bread. Low-fat, low-sodium crackers. Whole-wheat flour tortillas. Vegetables Fresh or frozen vegetables (raw, steamed, roasted, or grilled). Low-sodium or reduced-sodium tomato and vegetable juice. Low-sodium or reduced-sodium tomato sauce and tomato paste. Low-sodium or reduced-sodium canned vegetables. Fruits All fresh, dried, or frozen fruit. Canned fruit in natural juice (without  added sugar). Meat and other protein foods Skinless chicken or turkey. Ground chicken or turkey. Pork with fat trimmed off. Fish and seafood. Egg whites. Dried beans, peas, or lentils. Unsalted nuts, nut butters, and seeds. Unsalted canned beans. Lean cuts of beef with fat trimmed off. Low-sodium, lean deli meat. Dairy Low-fat (1%) or fat-free (skim) milk. Fat-free, low-fat, or reduced-fat cheeses. Nonfat, low-sodium ricotta or cottage cheese. Low-fat or nonfat yogurt. Low-fat, low-sodium cheese. Fats and oils Soft margarine without trans fats. Vegetable oil. Low-fat, reduced-fat, or light mayonnaise and salad dressings (reduced-sodium). Canola, safflower, olive, soybean, and sunflower oils. Avocado. Seasoning and other foods Herbs. Spices. Seasoning mixes without salt. Unsalted popcorn and pretzels. Fat-free sweets. What foods are not recommended? The items listed may not be a complete list. Talk with your dietitian about what dietary choices are best for you. Grains Baked goods made with fat, such as croissants, muffins, or some breads. Dry pasta or rice meal packs. Vegetables Creamed or fried vegetables. Vegetables in a cheese sauce. Regular canned vegetables (not low-sodium or reduced-sodium). Regular canned tomato sauce and paste (not low-sodium or reduced-sodium). Regular tomato and vegetable juice (not low-sodium or reduced-sodium). Pickles. Olives. Fruits Canned fruit in a light or heavy syrup. Fried fruit. Fruit in cream or butter sauce. Meat and other protein foods Fatty cuts of meat. Ribs. Fried meat. Bacon. Sausage. Bologna and other processed lunch meats. Salami. Fatback. Hotdogs. Bratwurst. Salted nuts and seeds. Canned beans with added salt. Canned or smoked fish. Whole eggs or egg yolks. Chicken or turkey with skin. Dairy Whole or 2% milk, cream, and half-and-half. Whole or full-fat cream cheese. Whole-fat or sweetened yogurt. Full-fat cheese. Nondairy creamers. Whipped toppings.  Processed cheese and cheese spreads. Fats and oils Butter. Stick margarine. Lard. Shortening. Ghee. Bacon fat. Tropical oils, such as coconut, palm kernel, or palm oil. Seasoning and other foods Salted popcorn and pretzels. Onion salt, garlic salt, seasoned salt, table salt, and sea salt. Worcestershire sauce. Tartar sauce. Barbecue sauce. Teriyaki sauce. Soy sauce, including reduced-sodium. Steak sauce. Canned and packaged gravies. Fish sauce. Oyster sauce. Cocktail sauce. Horseradish that you find on the shelf. Ketchup. Mustard. Meat flavorings and tenderizers. Bouillon cubes. Hot sauce and Tabasco sauce. Premade or packaged marinades. Premade or packaged taco seasonings. Relishes. Regular salad dressings. Where to find more information:  National Heart, Lung, and Blood Institute: www.nhlbi.nih.gov  American Heart Association: www.heart.org Summary  The DASH eating plan is a healthy eating plan that has been shown to reduce high blood pressure (hypertension). It may also reduce your risk for type 2 diabetes, heart disease, and stroke.  With the DASH eating plan, you should limit salt (sodium) intake to 2,300 mg a day. If you have hypertension, you may need to reduce your sodium intake to 1,500 mg a day.  When on the DASH eating plan, aim to eat more fresh fruits and vegetables, whole grains, lean proteins, low-fat dairy, and heart-healthy fats.  Work with your health care provider or diet and nutrition specialist (dietitian) to adjust your eating plan to your   individual calorie needs. This information is not intended to replace advice given to you by your health care provider. Make sure you discuss any questions you have with your health care provider. Document Revised: 04/20/2017 Document Reviewed: 05/01/2016 Elsevier Patient Education  2020 Elsevier Inc.  

## 2020-04-28 NOTE — Progress Notes (Signed)
Established Patient Office Visit  Subjective:  Patient ID: Rachel Camacho, female    DOB: [birth year]  Age: 74 y.o. MRN: [id number]  CC:  Chief Complaint  Patient presents with  . Diabetes  . Hypertension    HPI Rachel Camacho presents for follow up on diabetes mellitus and hypertension.  Patient reports she was at the dentist yesterday for a tooth extraction and ended up having two teeth extracted, however, experienced an episode of syncope.  States was evaluated by EMS and diastolic pressure was low at 50.  She declined going to the ED for evaluation.  States she did get anxious about having second tooth extraction.  Diabetes mellitus: Pt denies increased urination or thirst. Pt reports medication compliance. No hypoglycemic events. Checking glucose at home few times per week and fasting blood sugars range from 109-120.  Reports has decreased snacks and is watching carbohydrates and sweets.  Has started to go to the gym again and is walking.  HTN: Pt denies chest pain, palpitations, dizziness, headache or lower extremity swelling. Taking medication as directed without side effects. Checks BP at home and readings range 120-130/80. Pt reports good hydration. Has not been monitoring sodium.  HLD: Pt taking medication as directed without issues. Denies side effects. Patient is concerned about her heart given her incidence at the dentist yesterday. Denies anginal symptoms or dyspnea. Denies family history of heart diease.   Mood: Reports was started on Celexa a few years ago due to depression related to her mother passing away. States she is doing great. When the weather changes she does feel like her mood changes.   Rash: Has complaints of a rash right arm which has almost completed resolved. Applied topical steroid cream given for a previous rash which seemed to help. States rash is itchy. No history of childhood asthma or allergies.   Past Medical History:  Diagnosis Date  . Depression   .  Diabetes mellitus without complication ([place])   . Hyperlipidemia   . Hypertension   . 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 Intial UPC 4.7 grams. Conservative Rx. 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 Lorrene Reid)    History reviewed. No pertinent surgical history.  History reviewed. No pertinent family history.  Social History   Socioeconomic History  . Marital status: Divorced    Spouse name: Not on file  . Number of children: Not on file  . Years of education: Not on file  . Highest education level: Not on file  Occupational History  . Not on file  Tobacco Use  . Smoking status: Never Smoker  . Smokeless tobacco: Never Used  Vaping Use  . Vaping Use: Never used  Substance and Sexual Activity  . Alcohol use: Yes    Comment: social  . Drug use: No  . Sexual activity: Never    Birth control/protection: None  Other Topics Concern  . Not on file  Social History Narrative  . Not on file   Social Determinants of Health   Financial Resource Strain:   . Difficulty of Paying Living Expenses: Not on file  Food Insecurity:   . Worried About Charity fundraiser in the Last Year: Not on file  . Ran Out of Food in the Last Year: Not on file  Transportation Needs:   . Lack of Transportation (Medical): Not on file  . Lack of Transportation (Non-Medical): Not on file  Physical Activity:   . Days of Exercise per  Week: Not on file  . Minutes of Exercise per Session: Not on file  Stress:   . Feeling of Stress : Not on file  Social Connections:   . Frequency of Communication with Friends and Family: Not on file  . Frequency of Social Gatherings with Friends and Family: Not on file  . Attends Religious Services: Not on file  . Active Member of Clubs or Organizations: Not on file  . Attends Archivist Meetings: Not on file  . Marital Status: Not on file  Intimate Partner Violence:   . Fear of Current or Ex-Partner: Not on file   . Emotionally Abused: Not on file  . Physically Abused: Not on file  . Sexually Abused: Not on file    Outpatient Medications Prior to Visit  Medication Sig Dispense Refill  . amLODipine (NORVASC) 5 MG tablet TAKE 1 TABLET BY MOUTH  DAILY 90 tablet 0  . Blood Glucose Monitoring Suppl (ONETOUCH VERIO) w/Device KIT Use to test glucose fating in AM and 2 hours after largest meal 1 kit 0  . Calcium Carbonate-Vit D-Min (CALCIUM 1200 PO) Take 1 tablet by mouth daily.    . Cholecalciferol (VITAMIN D-3) 5000 units TABS Take 1 tablet by mouth daily. 30 tablet   . citalopram (CELEXA) 20 MG tablet TAKE 1 TABLET BY MOUTH  DAILY 90 tablet 0  . diclofenac Sodium (VOLTAREN) 1 % GEL Please specify directions, refills and quantity 100 g 0  . Lancets (ONETOUCH DELICA PLUS [id number]) MISC USE 2 DAILY AS DIRECTED 200 each 3  . ONETOUCH VERIO test strip USE TO CHECK FASTING GLUCOSE IN THE AM AND TO CHECK 2 HOURS AFTER LARGEST MEAL OF THE DAY. 100 each 12  . triamcinolone cream (KENALOG) 0.1 % Apply 1 application topically 2 (two) times daily. 30 g 0  . atorvastatin (LIPITOR) 20 MG tablet Take 1 tablet (20 mg total) by mouth at bedtime. **PATIENT NEEDS APT FOR FURTHER REFILLS** 60 tablet 0  . losartan (COZAAR) 100 MG tablet Take 1 tablet (100 mg total) by mouth daily. **PATIENT NEEDS APT FOR FURTHER REFILLS** 60 tablet 0  . metFORMIN (GLUCOPHAGE-XR) 500 MG 24 hr tablet Take 2 tablets (1,000 mg total) by mouth daily after supper. 60 tablet 0   No facility-administered medications prior to visit.    Allergies  Allergen Reactions  . Penicillins Hives    ROS Review of Systems A fourteen system review of systems was performed and found to be positive as per HPI.  Objective:    Physical Exam General:  Well Developed, well nourished, in no acute distress. Neuro:  Alert and oriented,  extra-ocular muscles intact, no focal deficits  HEENT:  Normocephalic, atraumatic, neck supple, no carotid bruits  appreciated Skin:  War, dry. Small erythematous and scaly lesions noted on right antecubital fossa with lichenification.  Cardiac:  RRR, S1 S2 Respiratory:  ECTA B/L w/o wheezing, Not using accessory muscles, speaking in full sentences- unlabored. Vascular:  Ext warm, no cyanosis apprec.; cap RF less 2 sec. Psych:  No HI/SI, judgement and insight good, Euthymic mood. Full Affect.   BP 109/70   Pulse 75   Ht '5\' 2"'  (1.575 m)   Wt 154 lb 14.4 oz (70.3 kg)   SpO2 97%   BMI 28.33 kg/m  Wt Readings from Last 3 Encounters:  04/28/20 154 lb 14.4 oz (70.3 kg)  01/21/20 152 lb 1.6 oz (69 kg)  10/30/19 150 lb 6.4 oz (68.2 kg)     Health  Maintenance Due  Topic Date Due  . TETANUS/TDAP  Never done  . FOOT EXAM  10/05/2018  . INFLUENZA VACCINE  12/21/2019    There are no preventive care reminders to display for this patient.  Lab Results  Component Value Date   TSH 2.220 02/10/2020   Lab Results  Component Value Date   WBC 6.7 02/10/2020   HGB 13.0 02/10/2020   HCT 39.4 02/10/2020   MCV 89 02/10/2020   PLT 296 02/10/2020   Lab Results  Component Value Date   NA 141 02/10/2020   K 4.2 02/10/2020   CO2 24 02/10/2020   GLUCOSE 94 02/10/2020   BUN 15 02/10/2020   CREATININE 0.82 02/10/2020   BILITOT 0.6 02/10/2020   ALKPHOS 70 02/10/2020   AST 18 02/10/2020   ALT 14 02/10/2020   PROT 6.7 02/10/2020   ALBUMIN 4.3 02/10/2020   CALCIUM 9.1 02/10/2020   Lab Results  Component Value Date   CHOL 122 02/10/2020   Lab Results  Component Value Date   HDL 58 02/10/2020   Lab Results  Component Value Date   LDLCALC 50 02/10/2020   Lab Results  Component Value Date   TRIG 64 02/10/2020   Lab Results  Component Value Date   CHOLHDL 2.1 02/10/2020   Lab Results  Component Value Date   HGBA1C 6.5 (A) 04/28/2020      Assessment & Plan:   Problem List Items Addressed This Visit      Cardiovascular and Mediastinum   Hypertension associated with diabetes ([place])  (Chronic)   Relevant Medications   atorvastatin (LIPITOR) 20 MG tablet   losartan (COZAAR) 100 MG tablet   metFORMIN (GLUCOPHAGE-XR) 500 MG 24 hr tablet     Endocrine   Diabetes mellitus without complication ([facility]) - Primary (Chronic)   Relevant Medications   atorvastatin (LIPITOR) 20 MG tablet   losartan (COZAAR) 100 MG tablet   metFORMIN (GLUCOPHAGE-XR) 500 MG 24 hr tablet   Other Relevant Orders   POCT glycosylated hemoglobin (Hb A1C) (Completed)   Mixed diabetic hyperlipidemia associated with type 2 diabetes mellitus ([facility]) (Chronic)   Relevant Medications   atorvastatin (LIPITOR) 20 MG tablet   losartan (COZAAR) 100 MG tablet   metFORMIN (GLUCOPHAGE-XR) 500 MG 24 hr tablet   Other Relevant Orders   ECHOCARDIOGRAM COMPLETE     Other   Mood disorder (HCC) (Chronic)    Other Visit Diagnoses    Syncope, unspecified syncope type       Relevant Orders   ECHOCARDIOGRAM COMPLETE     Syncope, unspecified syncope type: -Syncope episode is likely situational, reflex mediated or neural. Due to patient's concern for cardiac etiology will place order for echocardiogram. Vital signs today are within normal limits and patient denies cardiac symptoms.  -If recurrent syncope episodes occur recommend further evaluation.  Diabetes mellitus with complication: -W2B today is 6.5, continues to be at goal. -Continue current medication regimen. Provided refills. -Continue to monitor carbohydrates and glucose. Continue to stay as active as possible. -Will continue to monitor.  Hypertension associated with diabetes: -BP at goal. -Continue current medication regimen. Provided refills. -Continue to stay well-hydrated and recommend to follow a low-sodium diet. -Will continue to monitor.  Mixed diabetic hyperlipidemia associated with type 2 diabetes mellitus:  -Last lipid panel: Total cholesterol 122, triglycerides 64, HDL 58, LDL 50 -Continue Lipitor 20 mg. Provided refills. -Follow a heart  healthy diet. -Will continue to monitor and repeat lipid panel and hepatic function with [facility].  Mood disorder: -PHQ-9 score of 2, denies SI/HI. -Continue current medication regimen. -Will continue to monitor.  Flexural eczema: -Recommend to continue to use topical corticosteroid as needed for acute exacerbations. -Recommend to start using a daily skin emollient.    Meds ordered this encounter  Medications  . atorvastatin (LIPITOR) 20 MG tablet    Sig: Take 1 tablet (20 mg total) by mouth at bedtime.    Dispense:  90 tablet    Refill:  1    Requesting 1 year supply  . losartan (COZAAR) 100 MG tablet    Sig: Take 1 tablet (100 mg total) by mouth daily.    Dispense:  90 tablet    Refill:  1    Requesting 1 year supply  . metFORMIN (GLUCOPHAGE-XR) 500 MG 24 hr tablet    Sig: Take 2 tablets (1,000 mg total) by mouth daily after supper.    Dispense:  180 tablet    Refill:  1    Follow-up: Return in about 4 months (around 08/27/2020) for [place] and [facility].   Note:  This note was prepared with assistance of Dragon voice recognition software. Occasional wrong-word or sound-a-like substitutions may have occurred due to the inherent limitations of voice recognition software.  Lorrene Reid, PA-C

## 2020-04-29 ENCOUNTER — Other Ambulatory Visit: Payer: Self-pay | Admitting: Physician Assistant

## 2020-04-29 DIAGNOSIS — I152 Hypertension secondary to endocrine disorders: Secondary | ICD-10-CM

## 2020-04-29 DIAGNOSIS — E1159 Type 2 diabetes mellitus with other circulatory complications: Secondary | ICD-10-CM

## 2020-06-05 ENCOUNTER — Other Ambulatory Visit: Payer: Self-pay | Admitting: Physician Assistant

## 2020-06-05 DIAGNOSIS — F39 Unspecified mood [affective] disorder: Secondary | ICD-10-CM

## 2020-06-07 ENCOUNTER — Other Ambulatory Visit (HOSPITAL_COMMUNITY): Payer: Medicare Other

## 2020-06-24 ENCOUNTER — Ambulatory Visit (HOSPITAL_COMMUNITY): Payer: Medicare Other | Attending: Cardiology

## 2020-06-24 ENCOUNTER — Other Ambulatory Visit: Payer: Self-pay

## 2020-06-24 ENCOUNTER — Ambulatory Visit (INDEPENDENT_AMBULATORY_CARE_PROVIDER_SITE_OTHER): Payer: Medicare Other | Admitting: Physician Assistant

## 2020-06-24 ENCOUNTER — Encounter: Payer: Self-pay | Admitting: Physician Assistant

## 2020-06-24 VITALS — BP 110/70 | HR 69 | Temp 97.0°F | Ht 62.0 in | Wt 157.5 lb

## 2020-06-24 DIAGNOSIS — E1169 Type 2 diabetes mellitus with other specified complication: Secondary | ICD-10-CM | POA: Diagnosis not present

## 2020-06-24 DIAGNOSIS — F410 Panic disorder [episodic paroxysmal anxiety] without agoraphobia: Secondary | ICD-10-CM | POA: Diagnosis not present

## 2020-06-24 DIAGNOSIS — R55 Syncope and collapse: Secondary | ICD-10-CM | POA: Diagnosis not present

## 2020-06-24 DIAGNOSIS — E782 Mixed hyperlipidemia: Secondary | ICD-10-CM

## 2020-06-24 DIAGNOSIS — R1031 Right lower quadrant pain: Secondary | ICD-10-CM

## 2020-06-24 LAB — ECHOCARDIOGRAM COMPLETE
Area-P 1/2: 2.76 cm2
Height: 62 in
S' Lateral: 2.3 cm
Weight: 2520 oz

## 2020-06-24 MED ORDER — DIAZEPAM 5 MG PO TABS: 5.0000 mg | ORAL_TABLET | Freq: Two times a day (BID) | ORAL | 0 refills | Status: DC | PRN

## 2020-06-24 MED ORDER — PREDNISONE 20 MG PO TABS: ORAL_TABLET | ORAL | 0 refills | Status: DC

## 2020-06-24 NOTE — Progress Notes (Signed)
Acute Office Visit  Subjective:    Patient ID: Rachel Camacho, female    DOB: 01-Dec-1945, 75 y.o.   MRN: [id number]  Chief Complaint  Patient presents with  . Hip Pain    HPI Patient is in today for right groin pain that radiates to inner thigh for about 4 months. States pain is constant and usually last the full day where she has to rest because she can barely walk. Denies injury/trauma, strenuous activity with pain onset, bulge, hematuria, numbness or tingling sensation, fever, bowel or bladder dysfunction. When pain flares-up takes ibuprofen, applies heating pad or Vicks which does seem to help. Currently groin pain is minimal. Also requesting something to help calm her down for a dental procedure she has coming up. States last time she had a panic attack.  Past Medical History:  Diagnosis Date  . Depression   . Diabetes mellitus without complication ([place])   . Hyperlipidemia   . Hypertension   . 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 Intial UPC 4.7 grams. Conservative Rx. 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 Lorrene Reid)    History reviewed. No pertinent surgical history.  History reviewed. No pertinent family history.  Social History   Socioeconomic History  . Marital status: Divorced    Spouse name: Not on file  . Number of children: Not on file  . Years of education: Not on file  . Highest education level: Not on file  Occupational History  . Not on file  Tobacco Use  . Smoking status: Never Smoker  . Smokeless tobacco: Never Used  Vaping Use  . Vaping Use: Never used  Substance and Sexual Activity  . Alcohol use: Yes    Comment: social  . Drug use: No  . Sexual activity: Never    Birth control/protection: None  Other Topics Concern  . Not on file  Social History Narrative  . Not on file   Social Determinants of Health   Financial Resource Strain: Not on file  Food Insecurity: Not on file  Transportation Needs:  Not on file  Physical Activity: Not on file  Stress: Not on file  Social Connections: Not on file  Intimate Partner Violence: Not on file    Outpatient Medications Prior to Visit  Medication Sig Dispense Refill  . amLODipine (NORVASC) 5 MG tablet Take 1 tablet (5 mg total) by mouth daily. 90 tablet 0  . atorvastatin (LIPITOR) 20 MG tablet Take 1 tablet (20 mg total) by mouth at bedtime. 90 tablet 1  . Blood Glucose Monitoring Suppl (ONETOUCH VERIO) w/Device KIT Use to test glucose fating in AM and 2 hours after largest meal 1 kit 0  . Calcium Carbonate-Vit D-Min (CALCIUM 1200 PO) Take 1 tablet by mouth daily.    . Cholecalciferol (VITAMIN D-3) 5000 units TABS Take 1 tablet by mouth daily. 30 tablet   . citalopram (CELEXA) 20 MG tablet TAKE 1 TABLET BY MOUTH  DAILY 90 tablet 0  . diclofenac Sodium (VOLTAREN) 1 % GEL Please specify directions, refills and quantity 100 g 0  . Lancets (ONETOUCH DELICA PLUS [id number]) MISC USE 2 DAILY AS DIRECTED 200 each 3  . losartan (COZAAR) 100 MG tablet Take 1 tablet (100 mg total) by mouth daily. 90 tablet 1  . metFORMIN (GLUCOPHAGE-XR) 500 MG 24 hr tablet Take 2 tablets (1,000 mg total) by mouth daily after supper. 180 tablet 1  . ONETOUCH VERIO test strip USE TO CHECK FASTING GLUCOSE  IN THE AM AND TO CHECK 2 HOURS AFTER LARGEST MEAL OF THE DAY. 100 each 12  . triamcinolone cream (KENALOG) 0.1 % Apply 1 application topically 2 (two) times daily. 30 g 0   No facility-administered medications prior to visit.    Allergies  Allergen Reactions  . Penicillins Hives    Review of Systems A fourteen system review of systems was performed and found to be positive as per HPI.  Objective:    Physical Exam General:  Well Developed, well nourished, appropriate for stated age.  Neuro:  Alert and oriented,  extra-ocular muscles intact  HEENT:  Normocephalic, atraumatic, neck supple Skin:  no gross rash. No ecchymosis noted. Cardiac:  RRR Respiratory: Not  using accessory muscles, speaking in full sentences- unlabored. MSK: No step offs or bony abnormality noted, no TTP of C-spine/T-spine/L-spine or paraspinal muscles, mild TTP of right hip, negative straight leg raise b/l, good spinal and hip ROM GU: No hernia Vascular:  Ext warm, no cyanosis apprec.; cap RF less 2 sec. Psych:  No HI/SI, judgement and insight good, Euthymic mood. Full Affect.   BP 110/70   Pulse 69   Temp (!) 97 F (36.1 C)   Ht 5' 2" (1.575 m)   Wt 157 lb 8 oz (71.4 kg)   SpO2 98%   BMI 28.81 kg/m  Wt Readings from Last 3 Encounters:  06/24/20 157 lb 8 oz (71.4 kg)  04/28/20 154 lb 14.4 oz (70.3 kg)  01/21/20 152 lb 1.6 oz (69 kg)    Health Maintenance Due  Topic Date Due  . TETANUS/TDAP  Never done  . FOOT EXAM  10/05/2018  . INFLUENZA VACCINE  12/21/2019  . COVID-19 Vaccine (3 - Booster for Pfizer series) 02/19/2020    There are no preventive care reminders to display for this patient.   Lab Results  Component Value Date   TSH 2.220 02/10/2020   Lab Results  Component Value Date   WBC 6.7 02/10/2020   HGB 13.0 02/10/2020   HCT 39.4 02/10/2020   MCV 89 02/10/2020   PLT 296 02/10/2020   Lab Results  Component Value Date   NA 141 02/10/2020   K 4.2 02/10/2020   CO2 24 02/10/2020   GLUCOSE 94 02/10/2020   BUN 15 02/10/2020   CREATININE 0.82 02/10/2020   BILITOT 0.6 02/10/2020   ALKPHOS 70 02/10/2020   AST 18 02/10/2020   ALT 14 02/10/2020   PROT 6.7 02/10/2020   ALBUMIN 4.3 02/10/2020   CALCIUM 9.1 02/10/2020   Lab Results  Component Value Date   CHOL 122 02/10/2020   Lab Results  Component Value Date   HDL 58 02/10/2020   Lab Results  Component Value Date   LDLCALC 50 02/10/2020   Lab Results  Component Value Date   TRIG 64 02/10/2020   Lab Results  Component Value Date   CHOLHDL 2.1 02/10/2020   Lab Results  Component Value Date   HGBA1C 6.5 (A) 04/28/2020       Assessment & Plan:   Problem List Items Addressed  This Visit   None   Visit Diagnoses    Right inguinal pain    -  Primary   Relevant Medications   predniSONE (DELTASONE) 20 MG tablet   Panic attack       Relevant Medications   diazepam (VALIUM) 5 MG tablet     Right inguinal pain: -Discussed with patient likely MSK etiology (DDX: iliopsoas bursitis, piriformis syndrome, adductor tendonitis, femoroarticular  impingement, hip bursitis). Will start short course of corticosteroid therapy and if symptoms fail to improve or worsen recommend further evaluation with imaging studies. Patient verbalized understanding. -Recommend to avoid exacerbating activities, rest and ice/heat therapy.  Panic attack: -Will provide one dose of diazepam to take 30 minutes before dental procedure.   Meds ordered this encounter  Medications  . predniSONE (DELTASONE) 20 MG tablet    Sig: Take 2 tablets by x 2 days, then 1 tablets x 2 days, then 0.5 tablet x 2 days    Dispense:  7 tablet    Refill:  0    Order Specific Question:   Supervising Provider    Answer:   Beatrice Lecher D [[id number]]  . diazepam (VALIUM) 5 MG tablet    Sig: Take 1 tablet (5 mg total) by mouth every 12 (twelve) hours as needed for anxiety.    Dispense:  1 tablet    Refill:  0    Order Specific Question:   Supervising Provider    Answer:   Beatrice Lecher D [[id number]]     Lorrene Reid, PA-C

## 2020-07-06 ENCOUNTER — Telehealth: Payer: Self-pay | Admitting: Physician Assistant

## 2020-07-06 NOTE — Telephone Encounter (Signed)
Patient would like to know the results of her EKG she had done. Please advise, thanks.

## 2020-07-06 NOTE — Telephone Encounter (Signed)
Per Herb Grays Echo shows no structural abnormalities, normal heart function. She will further discuss with patient at upcoming visit in April. Patient is aware of the results and verbalized understanding.AS, CMA

## 2020-07-21 ENCOUNTER — Other Ambulatory Visit: Payer: Self-pay | Admitting: Physician Assistant

## 2020-07-21 DIAGNOSIS — I152 Hypertension secondary to endocrine disorders: Secondary | ICD-10-CM

## 2020-08-09 ENCOUNTER — Telehealth: Payer: Self-pay | Admitting: Physician Assistant

## 2020-08-09 NOTE — Telephone Encounter (Signed)
Patient understood and asked for a phone number for [facility].

## 2020-08-09 NOTE — Telephone Encounter (Signed)
.   Type Date User Summary Attachment  General 07/04/2017  1:24 PM [facility] - -  Note   Patient stated she will seek out her own ophthalmologist and make an appt. Told her if she needed any help to call our office           . Type Date User Summary Attachment  Provider Comments 07/04/2017 11:42 AM Mellody Dance, DO Provider Comments -  Note   Patient needs routine diabetic retinopathy examination.         Please advise patient when we placed the referral patient declined. She has not been established with an opthamologist that we referred her to. AS, CMA

## 2020-08-09 NOTE — Telephone Encounter (Signed)
Patient would like to know who the eye doctor was that she was referred to when Dr. Jenetta Downer was here. Please advise, thanks.

## 2020-08-19 ENCOUNTER — Other Ambulatory Visit: Payer: Self-pay | Admitting: Physician Assistant

## 2020-08-19 DIAGNOSIS — E782 Mixed hyperlipidemia: Secondary | ICD-10-CM

## 2020-08-19 DIAGNOSIS — E1159 Type 2 diabetes mellitus with other circulatory complications: Secondary | ICD-10-CM

## 2020-08-19 DIAGNOSIS — E559 Vitamin D deficiency, unspecified: Secondary | ICD-10-CM

## 2020-08-19 DIAGNOSIS — Z0000 Encounter for general adult medical examination without abnormal findings: Secondary | ICD-10-CM

## 2020-08-19 DIAGNOSIS — I152 Hypertension secondary to endocrine disorders: Secondary | ICD-10-CM

## 2020-08-19 DIAGNOSIS — E1169 Type 2 diabetes mellitus with other specified complication: Secondary | ICD-10-CM

## 2020-08-19 DIAGNOSIS — E118 Type 2 diabetes mellitus with unspecified complications: Secondary | ICD-10-CM

## 2020-08-20 ENCOUNTER — Other Ambulatory Visit: Payer: Self-pay | Admitting: Physician Assistant

## 2020-08-20 DIAGNOSIS — E119 Type 2 diabetes mellitus without complications: Secondary | ICD-10-CM

## 2020-08-25 ENCOUNTER — Other Ambulatory Visit: Payer: Medicare Other

## 2020-08-26 ENCOUNTER — Other Ambulatory Visit: Payer: Self-pay | Admitting: Physician Assistant

## 2020-08-26 DIAGNOSIS — F39 Unspecified mood [affective] disorder: Secondary | ICD-10-CM

## 2020-08-27 ENCOUNTER — Ambulatory Visit (INDEPENDENT_AMBULATORY_CARE_PROVIDER_SITE_OTHER): Payer: Medicare Other | Admitting: Physician Assistant

## 2020-08-27 ENCOUNTER — Encounter: Payer: Self-pay | Admitting: Physician Assistant

## 2020-08-27 VITALS — BP 148/91 | HR 58 | Ht 63.0 in | Wt 155.0 lb

## 2020-08-27 DIAGNOSIS — Z0000 Encounter for general adult medical examination without abnormal findings: Secondary | ICD-10-CM | POA: Diagnosis not present

## 2020-08-27 NOTE — Progress Notes (Signed)
Virtual Visit via Telephone Note:  I connected with [name] by telephone and verified that I am speaking with the correct person using two identifiers.    I discussed the limitations, risks, security and privacy concerns for performing an evaluation and management service by telephone and the availability of in person appointments. The staff discussed with patient that there may be a patient responsible charge related to this service. The patient expressed understanding and agreed to proceed.   Location of Patient- Home Location of Provider- Office    Subjective:   Rachel Camacho is a 75 y.o. female who presents for Medicare Annual (Subsequent) preventive examination.  Review of Systems    General:   No F/C, wt loss Pulm:   No DIB, SOB, pleuritic chest pain Card:  No CP, palpitations Abd:  No n/v/d or pain Ext:  No inc edema from baseline   Objective:    Today's Vitals   08/27/20 1021  BP: (!) 148/91  Pulse: (!) 58  Weight: 155 lb (70.3 kg)  Height: '5\' 3"'  (1.6 m)   Body mass index is 27.46 kg/m.  No flowsheet data found.  Current Medications (verified) Outpatient Encounter Medications as of 08/27/2020  Medication Sig  . amLODipine (NORVASC) 5 MG tablet TAKE 1 TABLET BY MOUTH  DAILY  . atorvastatin (LIPITOR) 20 MG tablet Take 1 tablet (20 mg total) by mouth at bedtime.  . Blood Glucose Monitoring Suppl (ONETOUCH VERIO) w/Device KIT Use to test glucose fating in AM and 2 hours after largest meal  . Calcium Carbonate-Vit D-Min (CALCIUM 1200 PO) Take 1 tablet by mouth daily.  . Cholecalciferol (VITAMIN D-3) 5000 units TABS Take 1 tablet by mouth daily.  . diazepam (VALIUM) 5 MG tablet Take 1 tablet (5 mg total) by mouth every 12 (twelve) hours as needed for anxiety.  . Lancets (ONETOUCH DELICA PLUS [id number]) MISC USE 2 DAILY AS DIRECTED  . losartan (COZAAR) 100 MG tablet Take 1 tablet (100 mg total) by mouth daily.  . metFORMIN (GLUCOPHAGE-XR) 500 MG 24 hr tablet Take 2  tablets (1,000 mg total) by mouth daily after supper.  Glory Rosebush VERIO test strip USE TO CHECK FASTING GLUCOSE IN THE AM AND TO CHECK 2 HOURS AFTER LARGEST MEAL OF THE DAY.  . [DISCONTINUED] citalopram (CELEXA) 20 MG tablet TAKE 1 TABLET BY MOUTH  DAILY  . diclofenac Sodium (VOLTAREN) 1 % GEL Please specify directions, refills and quantity (Patient not taking: Reported on 08/27/2020)  . triamcinolone cream (KENALOG) 0.1 % Apply 1 application topically 2 (two) times daily. (Patient not taking: Reported on 08/27/2020)  . [DISCONTINUED] predniSONE (DELTASONE) 20 MG tablet Take 2 tablets by x 2 days, then 1 tablets x 2 days, then 0.5 tablet x 2 days (Patient not taking: Reported on 08/27/2020)   No facility-administered encounter medications on file as of 08/27/2020.    Allergies (verified) Penicillins   History: Past Medical History:  Diagnosis Date  . Depression   . Diabetes mellitus without complication ([place])   . Hyperlipidemia   . Hypertension   . 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 Intial UPC 4.7 grams. Conservative Rx. 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 Lorrene Reid)   History reviewed. No pertinent surgical history. History reviewed. No pertinent family history. Social History   Socioeconomic History  . Marital status: Divorced    Spouse name: Not on file  . Number of children: Not on file  . Years of education: Not  on file  . Highest education level: Not on file  Occupational History  . Not on file  Tobacco Use  . Smoking status: Never Smoker  . Smokeless tobacco: Never Used  Vaping Use  . Vaping Use: Never used  Substance and Sexual Activity  . Alcohol use: Yes    Comment: social  . Drug use: No  . Sexual activity: Never    Birth control/protection: None  Other Topics Concern  . Not on file  Social History Narrative  . Not on file   Social Determinants of Health   Financial Resource Strain: Not on file  Food  Insecurity: Not on file  Transportation Needs: Not on file  Physical Activity: Not on file  Stress: Not on file  Social Connections: Not on file    Tobacco Counseling Counseling given: Not Answered    Diabetic? yes    Activities of Daily Living In your present state of health, do you have any difficulty performing the following activities: 08/27/2020 06/24/2020  Hearing? N N  Vision? N N  Difficulty concentrating or making decisions? N N  Walking or climbing stairs? N N  Dressing or bathing? N N  Doing errands, shopping? N N  Some recent data might be hidden    Patient Care Team: Lorrene Reid, PA-C as PCP - General Jamal Maes, MD as Consulting Physician (Nephrology)  Indicate any recent Medical Services you may have received from other than [facility] providers in the past year (date may be approximate).     Assessment:   This is a routine wellness examination for Rachel Camacho.  Hearing/Vision screen No exam data present  Dietary issues and exercise activities discussed: -Continue to stay as active as possible, follow a heart healthy diet and monitor carbohydrates/glucose.  Goals   None    Depression Screen PHQ 2/9 Scores 08/27/2020 06/24/2020 04/28/2020 01/21/2020 10/30/2019 08/27/2019 06/25/2019  PHQ - 2 Score 0 0 0 0 0 0 2  PHQ- 9 Score 1 0 2 0 0 1 2    Fall Risk Fall Risk  08/27/2020 06/24/2020 04/28/2020 01/21/2020 08/27/2019  Falls in the past year? 0 0 0 0 0  Number falls in past yr: - - - - 0  Injury with Fall? - - - - 0  Follow up Falls evaluation completed Falls evaluation completed Falls evaluation completed Falls evaluation completed Falls evaluation completed    [place]:  Any stairs in or around the home? Yes  If so, are there any without handrails? Yes  Home free of loose throw rugs in walkways, pet beds, electrical cords, etc? Yes  Adequate lighting in your home to reduce risk of falls? Yes   ASSISTIVE DEVICES UTILIZED TO PREVENT  FALLS:  Life alert? No  Use of a cane, walker or w/c? No  Grab bars in the bathroom? No  Shower chair or bench in shower? No  Elevated toilet seat or a handicapped toilet? No   TIMED UP AND GO:  Was the test performed? No .  Length of time to ambulate 10 feet:  sec.   Telehealth  Cognitive Function: wnl     6CIT Screen 08/27/2020 08/27/2019  What Year? 0 points 0 points  What month? 0 points 0 points  What time? 0 points 0 points  Count back from 20 0 points 0 points  Months in reverse 0 points 0 points  Repeat phrase 0 points 0 points  Total Score 0 0    Immunizations  Immunization History  Administered Date(s) Administered  . Influenza, High Dose Seasonal PF 05/08/2019  . PFIZER(Purple Top)SARS-COV-2 Vaccination 07/20/2019, 08/19/2019  . PPD Test 12/31/2017  . Pneumococcal Conjugate-13 10/04/2017  . Pneumococcal Polysaccharide-23 04/09/2018    TDAP status: Due, Education has been provided regarding the importance of this vaccine. Advised may receive this vaccine at local pharmacy or Health Dept. Aware to provide a copy of the vaccination record if obtained from local pharmacy or Health Dept. Verbalized acceptance and understanding.  Flu Vaccine status: Declined, Education has been provided regarding the importance of this vaccine but patient still declined. Advised may receive this vaccine at local pharmacy or Health Dept. Aware to provide a copy of the vaccination record if obtained from local pharmacy or Health Dept. Verbalized acceptance and understanding.  Pneumococcal vaccine status: Up to date  Covid-19 vaccine status: Completed vaccines  Qualifies for Shingles Vaccine? Yes   Zostavax completed No   Shingrix Completed?: No.    Education has been provided regarding the importance of this vaccine. Patient has been advised to call insurance company to determine out of pocket expense if they have not yet received this vaccine. Advised may also receive vaccine at local  pharmacy or Health Dept. Verbalized acceptance and understanding.  Screening Tests Health Maintenance  Topic Date Due  . TETANUS/TDAP  Never done  . FOOT EXAM  10/05/2018  . COVID-19 Vaccine (3 - Booster for Pfizer series) 02/19/2020  . HEMOGLOBIN A1C  10/27/2020  . INFLUENZA VACCINE  12/20/2020  . OPHTHALMOLOGY EXAM  04/02/2021  . COLONOSCOPY (Pts 45-78yr Insurance coverage will need to be confirmed)  04/24/2028  . DEXA SCAN  Completed  . Hepatitis C Screening  Completed  . PNA vac Low Risk Adult  Completed  . HPV VACCINES  Aged Out    Health Maintenance  Health Maintenance Due  Topic Date Due  . TETANUS/TDAP  Never done  . FOOT EXAM  10/05/2018  . COVID-19 Vaccine (3 - Booster for Pfizer series) 02/19/2020    Colorectal cancer screening: Type of screening: Colonoscopy. Completed 04/24/2018. Repeat every 3 years  Mammogram status: No longer required due to age.  Bone Density status: Completed 12/06/2019. Results reflect: Bone density results: NORMAL. Repeat every 0 years.  Lung Cancer Screening: (Low Dose CT Chest recommended if Age 76-80years, 30 pack-year currently smoking OR have quit w/in 15years.) does not qualify.   Lung Cancer Screening Referral: n/a  Additional Screening:  Hepatitis C Screening: does qualify; Completed 09/03/2019  Vision Screening: Recommended annual ophthalmology exams for early detection of glaucoma and other disorders of the eye. Is the patient up to date with their annual eye exam?  Yes  Who is the provider or what is the name of the office in which the patient attends annual eye exams? [facility] If pt is not established with a provider, would they like to be referred to a provider to establish care? No .   Dental Screening: Recommended annual dental exams for proper oral hygiene  Community Resource Referral / Chronic Care Management: CRR required this visit?  No   CCM required this visit?  No      Plan:  -Continue  current medication regimen. -Schedule lab visit for FBW. -Follow up in 4 months for DM, HTN, HLD, knee pain  I have personally reviewed and noted the following in the patient's chart:   . Medical and social history . Use of alcohol, tobacco or illicit drugs  . Current medications and supplements .  Functional ability and status . Nutritional status . Physical activity . Advanced directives . List of other physicians . Hospitalizations, surgeries, and ER visits in previous 12 months . Vitals . Screenings to include cognitive, depression, and falls . Referrals and appointments  In addition, I have reviewed and discussed with patient certain preventive protocols, quality metrics, and best practice recommendations. A written personalized care plan for preventive services as well as general preventive health recommendations were provided to patient.

## 2020-08-27 NOTE — Patient Instructions (Signed)
Preventive Care 76 Years and Older, Female Preventive care refers to lifestyle choices and visits with your health care provider that can promote health and wellness. This includes:  A yearly physical exam. This is also called an annual wellness visit.  Regular dental and eye exams.  Immunizations.  Screening for certain conditions.  Healthy lifestyle choices, such as: ? Eating a healthy diet. ? Getting regular exercise. ? Not using drugs or products that contain nicotine and tobacco. ? Limiting alcohol use. What can I expect for my preventive care visit? Physical exam Your health care provider will check your:  Height and weight. These may be used to calculate your BMI (body mass index). BMI is a measurement that tells if you are at a healthy weight.  Heart rate and blood pressure.  Body temperature.  Skin for abnormal spots. Counseling Your health care provider may ask you questions about your:  Past medical problems.  Family's medical history.  Alcohol, tobacco, and drug use.  Emotional well-being.  Home life and relationship well-being.  Sexual activity.  Diet, exercise, and sleep habits.  History of falls.  Memory and ability to understand (cognition).  Work and work Statistician.  Pregnancy and menstrual history.  Access to firearms. What immunizations do I need? Vaccines are usually given at various ages, according to a schedule. Your health care provider will recommend vaccines for you based on your age, medical history, and lifestyle or other factors, such as travel or where you work.   What tests do I need? Blood tests  Lipid and cholesterol levels. These may be checked every 5 years, or more often depending on your overall health.  Hepatitis C test.  Hepatitis B test. Screening  Lung cancer screening. You may have this screening every year starting at age 76 if you have a 30-pack-year history of smoking and currently smoke or have quit within  the past 15 years.  Colorectal cancer screening. ? All adults should have this screening starting at age 76 and continuing until age 58. ? Your health care provider may recommend screening at age 76 if you are at increased risk. ? You will have tests every 1-10 years, depending on your results and the type of screening test.  Diabetes screening. ? This is done by checking your blood sugar (glucose) after you have not eaten for a while (fasting). ? You may have this done every 1-3 years.  Mammogram. ? This may be done every 1-2 years. ? Talk with your health care provider about how often you should have regular mammograms.  Abdominal aortic aneurysm (AAA) screening. You may need this if you are a current or former smoker.  BRCA-related cancer screening. This may be done if you have a family history of breast, ovarian, tubal, or peritoneal cancers. Other tests  STD (sexually transmitted disease) testing, if you are at risk.  Bone density scan. This is done to screen for osteoporosis. You may have this done starting at age 76. Talk with your health care provider about your test results, treatment options, and if necessary, the need for more tests. Follow these instructions at home: Eating and drinking  Eat a diet that includes fresh fruits and vegetables, whole grains, lean protein, and low-fat dairy products. Limit your intake of foods with high amounts of sugar, saturated fats, and salt.  Take vitamin and mineral supplements as recommended by your health care provider.  Do not drink alcohol if your health care provider tells you not to drink.  If you drink alcohol: ? Limit how much you have to 0-1 drink a day. ? Be aware of how much alcohol is in your drink. In the [place] one drink equals one 12 oz bottle of beer (355 mL), one 5 oz glass of wine (148 mL), or one 1 oz glass of hard liquor (44 mL).   Lifestyle  Take daily care of your teeth and gums. Brush your teeth every morning  and night with fluoride toothpaste. Floss one time each day.  Stay active. Exercise for at least 30 minutes 5 or more days each week.  Do not use any products that contain nicotine or tobacco, such as cigarettes, e-cigarettes, and chewing tobacco. If you need help quitting, ask your health care provider.  Do not use drugs.  If you are sexually active, practice safe sex. Use a condom or other form of protection in order to prevent STIs (sexually transmitted infections).  Talk with your health care provider about taking a low-dose aspirin or statin.  Find healthy ways to cope with stress, such as: ? Meditation, yoga, or listening to music. ? Journaling. ? Talking to a trusted person. ? Spending time with friends and family. Safety  Always wear your seat belt while driving or riding in a vehicle.  Do not drive: ? If you have been drinking alcohol. Do not ride with someone who has been drinking. ? When you are tired or distracted. ? While texting.  Wear a helmet and other protective equipment during sports activities.  If you have firearms in your house, make sure you follow all gun safety procedures. What's next?  Visit your health care provider once a year for an annual wellness visit.  Ask your health care provider how often you should have your eyes and teeth checked.  Stay up to date on all vaccines. This information is not intended to replace advice given to you by your health care provider. Make sure you discuss any questions you have with your health care provider. Document Revised: 04/28/2020 Document Reviewed: 05/02/2018 Elsevier Patient Education  2021 Elsevier Inc.  

## 2020-09-08 ENCOUNTER — Telehealth: Payer: Self-pay | Admitting: Physician Assistant

## 2020-09-08 NOTE — Telephone Encounter (Signed)
Patient states she is having covid like symptoms-sore throat, cough, body ache, fatigue-but has not taken a covid test. Patient states she is unable to take her temperature at home but denies SOB or difficulty breathing.   Advised patient to take a covid test. Pt verbalized understanding. Pt advised to go to ED should she develop a high fever that will not reduce with fever reducing medications or should she become SOB or have difficulty breathing. Patient verbalized understanding. Pt advised to call our office back with results for COVID test so that we can refer her to antibody infusion clinic or schedule patient an in person appointment for evaluation. Pt verbalized understanding and was agreeable. AS, CMA

## 2020-09-08 NOTE — Telephone Encounter (Signed)
Left msg for patient to call back. AS, CMA 

## 2020-09-08 NOTE — Telephone Encounter (Signed)
Patient thinks she might have COVID but has not taken a test yet. Symptoms include sore throat, stuffy nose, tired, and she states she cannot keep her eyes open. No cough though. Please advise, thanks.

## 2020-09-14 ENCOUNTER — Telehealth: Payer: Self-pay | Admitting: Physician Assistant

## 2020-09-14 NOTE — Telephone Encounter (Signed)
Patient would like a referral for an x-ray or an MRI for her knee. Patient's knee is in pain, severe pain to start and now it is easing up. It started with her knee and then went through the bottom leg. (Right knee and right leg.) Patient would like a referral within the [facility] system. Patient would like it in [place]. Please advise, thanks.

## 2020-09-15 ENCOUNTER — Other Ambulatory Visit: Payer: Self-pay | Admitting: Physician Assistant

## 2020-09-15 DIAGNOSIS — E782 Mixed hyperlipidemia: Secondary | ICD-10-CM

## 2020-09-15 DIAGNOSIS — E1159 Type 2 diabetes mellitus with other circulatory complications: Secondary | ICD-10-CM

## 2020-09-15 DIAGNOSIS — I152 Hypertension secondary to endocrine disorders: Secondary | ICD-10-CM

## 2020-09-15 DIAGNOSIS — E1169 Type 2 diabetes mellitus with other specified complication: Secondary | ICD-10-CM

## 2020-09-15 NOTE — Telephone Encounter (Signed)
Please contact patient to schedule in office apt for her to discuss this issue with provider. She can see Herb Grays or [employer].

## 2020-09-15 NOTE — Telephone Encounter (Signed)
Patient scheduled.

## 2020-09-20 ENCOUNTER — Ambulatory Visit: Payer: Medicare Other | Admitting: Physician Assistant

## 2020-09-20 ENCOUNTER — Telehealth: Payer: Self-pay | Admitting: Physician Assistant

## 2020-09-20 NOTE — Telephone Encounter (Signed)
Patient has not been seen for this issue in the past and will need to reschedule her appointment with provider to discuss issues. AS, CMA

## 2020-09-20 NOTE — Telephone Encounter (Signed)
Patient arrived for a visit. Patient missed her appt this morning, she thought it was at 2:00 pm, not 10:20 am.  She is asking for a referral to a specialist for her knee pain. Thank you

## 2020-09-28 DIAGNOSIS — Z20822 Contact with and (suspected) exposure to covid-19: Secondary | ICD-10-CM | POA: Diagnosis not present

## 2020-10-11 ENCOUNTER — Other Ambulatory Visit: Payer: Self-pay

## 2020-10-11 ENCOUNTER — Encounter: Payer: Self-pay | Admitting: Physician Assistant

## 2020-10-11 ENCOUNTER — Ambulatory Visit (INDEPENDENT_AMBULATORY_CARE_PROVIDER_SITE_OTHER): Payer: Medicare Other | Admitting: Physician Assistant

## 2020-10-11 VITALS — BP 109/65 | HR 68 | Temp 99.0°F | Ht 62.0 in | Wt 156.0 lb

## 2020-10-11 DIAGNOSIS — M25561 Pain in right knee: Secondary | ICD-10-CM

## 2020-10-11 NOTE — Progress Notes (Signed)
 Acute Office Visit  Subjective:    Patient ID: Rachel Camacho, female    DOB: 06/20/1945, 74 y.o.   MRN: [id number]  Chief Complaint  Patient presents with  . Acute Visit    Knee Pain and Referral    HPI Patient is in today for intermittent right knee pain. Patient reports going upstairs or extended period of walking will trigger symptoms. Symptoms can last 1 day to a week. Reports one severe episode where she had to use a cane for ambulation assistance. Also states has stiffness during flare-ups which limit her mobility. Denies swelling, redness, or prior hx of knee injury or trauma. Ibuprofen, resting and wrapping it helps improve the pain. Currently asymptomatic. Requesting referral to specialist.    Past Medical History:  Diagnosis Date  . Depression   . Diabetes mellitus without complication (HCC)   . Hyperlipidemia   . Hypertension   . 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 Intial UPC 4.7 grams. Conservative Rx. 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 Dunham)    History reviewed. No pertinent surgical history.  History reviewed. No pertinent family history.  Social History   Socioeconomic History  . Marital status: Divorced    Spouse name: Not on file  . Number of children: Not on file  . Years of education: Not on file  . Highest education level: Not on file  Occupational History  . Not on file  Tobacco Use  . Smoking status: Never Smoker  . Smokeless tobacco: Never Used  Vaping Use  . Vaping Use: Never used  Substance and Sexual Activity  . Alcohol use: Yes    Comment: social  . Drug use: No  . Sexual activity: Never    Birth control/protection: None  Other Topics Concern  . Not on file  Social History Narrative  . Not on file   Social Determinants of Health   Financial Resource Strain: Not on file  Food Insecurity: Not on file  Transportation Needs: Not on file  Physical Activity: Not on file  Stress:  Not on file  Social Connections: Not on file  Intimate Partner Violence: Not on file    Outpatient Medications Prior to Visit  Medication Sig Dispense Refill  . amLODipine (NORVASC) 5 MG tablet TAKE 1 TABLET BY MOUTH  DAILY 90 tablet 0  . atorvastatin (LIPITOR) 20 MG tablet TAKE 1 TABLET BY MOUTH AT  BEDTIME 90 tablet 0  . Blood Glucose Monitoring Suppl (ONETOUCH VERIO) w/Device KIT Use to test glucose fating in AM and 2 hours after largest meal 1 kit 0  . Calcium Carbonate-Vit D-Min (CALCIUM 1200 PO) Take 1 tablet by mouth daily.    . Cholecalciferol (VITAMIN D-3) 5000 units TABS Take 1 tablet by mouth daily. 30 tablet   . citalopram (CELEXA) 20 MG tablet TAKE 1 TABLET BY MOUTH  DAILY 90 tablet 0  . diazepam (VALIUM) 5 MG tablet Take 1 tablet (5 mg total) by mouth every 12 (twelve) hours as needed for anxiety. 1 tablet 0  . diclofenac Sodium (VOLTAREN) 1 % GEL Please specify directions, refills and quantity 100 g 0  . Lancets (ONETOUCH DELICA PLUS LANCET33G) MISC USE 2 DAILY AS DIRECTED 200 each 3  . losartan (COZAAR) 100 MG tablet TAKE 1 TABLET BY MOUTH  DAILY 90 tablet 0  . metFORMIN (GLUCOPHAGE-XR) 500 MG 24 hr tablet Take 2 tablets (1,000 mg total) by mouth daily after supper. 180 tablet 1  .   ONETOUCH VERIO test strip USE TO CHECK FASTING GLUCOSE IN THE AM AND TO CHECK 2 HOURS AFTER LARGEST MEAL OF THE DAY. 100 each 12  . triamcinolone cream (KENALOG) 0.1 % Apply 1 application topically 2 (two) times daily. 30 g 0   No facility-administered medications prior to visit.    Allergies  Allergen Reactions  . Penicillins Hives    Review of Systems Review of Systems:  A fourteen system review of systems was performed and found to be positive as per HPI.    Objective:    Physical Exam Constitutional:      General: She is not in acute distress.    Appearance: She is not toxic-appearing.  HENT:     Head: Normocephalic and atraumatic.  Cardiovascular:     Rate and Rhythm: Normal  rate.     Pulses: Normal pulses.     Heart sounds: Normal heart sounds.  Pulmonary:     Effort: Pulmonary effort is normal.     Breath sounds: Normal breath sounds.  Musculoskeletal:        General: No swelling, tenderness or deformity. Normal range of motion.  Skin:    General: Skin is warm and dry.  Neurological:     Mental Status: She is alert.  Psychiatric:        Mood and Affect: Mood normal.        Behavior: Behavior normal.        Thought Content: Thought content normal.        Judgment: Judgment normal.     BP 109/65   Pulse 68   Temp 99 F (37.2 C)   Ht 5' 2" (1.575 m)   Wt 156 lb (70.8 kg)   SpO2 98%   BMI 28.53 kg/m  Wt Readings from Last 3 Encounters:  10/11/20 156 lb (70.8 kg)  08/27/20 155 lb (70.3 kg)  06/24/20 157 lb 8 oz (71.4 kg)    Health Maintenance Due  Topic Date Due  . TETANUS/TDAP  Never done  . FOOT EXAM  10/05/2018    There are no preventive care reminders to display for this patient.   Lab Results  Component Value Date   TSH 2.220 02/10/2020   Lab Results  Component Value Date   WBC 6.7 02/10/2020   HGB 13.0 02/10/2020   HCT 39.4 02/10/2020   MCV 89 02/10/2020   PLT 296 02/10/2020   Lab Results  Component Value Date   NA 141 02/10/2020   K 4.2 02/10/2020   CO2 24 02/10/2020   GLUCOSE 94 02/10/2020   BUN 15 02/10/2020   CREATININE 0.82 02/10/2020   BILITOT 0.6 02/10/2020   ALKPHOS 70 02/10/2020   AST 18 02/10/2020   ALT 14 02/10/2020   PROT 6.7 02/10/2020   ALBUMIN 4.3 02/10/2020   CALCIUM 9.1 02/10/2020   Lab Results  Component Value Date   CHOL 122 02/10/2020   Lab Results  Component Value Date   HDL 58 02/10/2020   Lab Results  Component Value Date   LDLCALC 50 02/10/2020   Lab Results  Component Value Date   TRIG 64 02/10/2020   Lab Results  Component Value Date   CHOLHDL 2.1 02/10/2020   Lab Results  Component Value Date   HGBA1C 6.5 (A) 04/28/2020       Assessment & Plan:   Problem List  Items Addressed This Visit   None   Visit Diagnoses    Right knee pain, unspecified chronicity    -    Primary   Relevant Orders   Ambulatory referral to Sports Medicine     Right knee pain: -Discussed with patient likely MSK related such as arthritis. PE unremarkable. -Recommend to continue conservative management.  -Will place referral to Sports Medicine for further evaluation.   Of note, towards the end of visit patient mentions feeling more tired than normal. Patient has not been taking Vit D supplement. Advised to resume Vit D and if symptoms fail to improve or worsen recommend to follow up for further evaluation.     No orders of the defined types were placed in this encounter.    Lorrene Reid, PA-C

## 2020-10-11 NOTE — Patient Instructions (Signed)
Chronic Knee Pain, Adult Knee pain that lasts longer than 3 months is called chronic knee pain. You may have pain in one or both knees. Symptoms of chronic knee pain may also include swelling and stiffness. The most common cause is age-related wear and tear (osteoarthritis) of your knee joint. Many conditions can cause chronic knee pain. Treatment depends on the cause. The main treatments are physical therapy and weight loss. It may also be treated with medicines, injections, a knee sleeve or brace, and by using crutches. Rest, ice, pressure (compression), and elevation, also known as RICE therapy, may also be recommended. Follow these instructions at home: If you have a knee sleeve or brace:  Wear the knee sleeve or brace as told by your doctor. Take it off only as told by your doctor.  Loosen it if your toes: ? Tingle. ? Become numb. ? Turn cold and blue.  Keep it clean.  If the sleeve or brace is not waterproof: ? Do not let it get wet. ? Ask your doctor if you may take it off when you take a bath or shower. If not, cover it with a watertight covering.   Managing pain, stiffness, and swelling  If told, put heat on your knee. Do this as often as told by your doctor. Use the heat source that your doctor recommends, such as a moist heat pack or a heating pad. ? If you have a removable knee sleeve or brace, take it off as told by your doctor. ? Place a towel between your skin and the heat source. ? Leave the heat on for 20-30 minutes. ? Take off the heat if your skin turns bright red. This is very important. If you cannot feel pain, heat, or cold, you have a greater risk of getting burned.  If told, put ice on your knee. To do this: ? If you have a removable knee sleeve or brace, take it off as told by your doctor. ? Put ice in a plastic bag. ? Place a towel between your skin and the bag. ? Leave the ice on for 20 minutes, 2-3 times a day. ? Take off the ice if your skin turns bright  red. This is very important. If you cannot feel pain, heat, or cold, you have a greater risk of damage to the area.  Move your toes often.  Raise the injured area above the level of your heart while you are sitting or lying down.      Activity  Avoid activities where both feet leave the ground at the same time (high-impact activities). Examples are running, jumping rope, and doing jumping jacks.  Follow the exercise plan that your doctor makes for you. Your doctor may suggest that you: ? Avoid activities that make knee pain worse. You may need to change the exercises that you do, the sports that you participate in, or your job duties. ? Wear shoes with cushioned soles. ? Avoid sports that require running and sudden changes in direction. ? Do exercises or physical therapy. This is planned to match your needs and your abilities. ? Do exercises that increase your balance and strength, such as tai chi and yoga.  Do not use your injured knee to support your body weight until your doctor says that you can. Use crutches as told by your doctor.  Return to your normal activities when your doctor says that it is safe. General instructions  Take over-the-counter and prescription medicines only as told by your   doctor.  If you are overweight, work with your doctor and a food expert (dietitian) to set goals to lose weight. Being overweight can make your knee hurt more.  Do not smoke or use any products that contain nicotine or tobacco. If you need help quitting, ask your doctor.  Keep all follow-up visits. Contact a doctor if:  You have knee pain that is not getting better or gets worse.  You are not able to do your exercises due to knee pain. Get help right away if:  Your knee swells and the swelling gets worse.  You cannot move your knee.  You have very bad knee pain. Summary  Knee pain that lasts more than 3 months is called chronic knee pain.  The main treatments for chronic knee  pain are physical therapy and weight loss. You may also need to take medicines, wear a knee sleeve or brace, use crutches, and put ice or heat on your knee.  Lose weight if you are overweight. Work with your doctor and a food expert (dietitian) to help you set goals to lose weight. Being overweight can make your knee hurt more.  Follow the exercise plan that your doctor makes for you. This information is not intended to replace advice given to you by your health care provider. Make sure you discuss any questions you have with your health care provider. Document Revised: 10/22/2019 Document Reviewed: 10/22/2019 Elsevier Patient Education  2021 [employer]vier Inc.  

## 2020-10-14 ENCOUNTER — Other Ambulatory Visit: Payer: Self-pay | Admitting: Physician Assistant

## 2020-10-14 ENCOUNTER — Telehealth: Payer: Self-pay | Admitting: Physician Assistant

## 2020-10-14 DIAGNOSIS — F410 Panic disorder [episodic paroxysmal anxiety] without agoraphobia: Secondary | ICD-10-CM

## 2020-10-14 MED ORDER — DIAZEPAM 5 MG PO TABS: 5.0000 mg | ORAL_TABLET | Freq: Two times a day (BID) | ORAL | 0 refills | Status: DC | PRN

## 2020-10-14 NOTE — Telephone Encounter (Signed)
Refill sent for Q:5 tabs. If patient needs a refill in the near future will need an appointment to further discuss.   Thank you, Herb Grays

## 2020-10-14 NOTE — Telephone Encounter (Signed)
Patient needs a refill on diazepam and would like at least five. She needs them for the dentist so she does not have a panic attack. Please advise, thanks.

## 2020-10-15 ENCOUNTER — Other Ambulatory Visit: Payer: Self-pay | Admitting: Physician Assistant

## 2020-10-15 DIAGNOSIS — E1159 Type 2 diabetes mellitus with other circulatory complications: Secondary | ICD-10-CM

## 2020-10-15 DIAGNOSIS — I152 Hypertension secondary to endocrine disorders: Secondary | ICD-10-CM

## 2020-10-15 NOTE — Telephone Encounter (Signed)
Patient contacted and made aware. 

## 2020-11-03 ENCOUNTER — Other Ambulatory Visit: Payer: Self-pay | Admitting: Physician Assistant

## 2020-11-03 DIAGNOSIS — E118 Type 2 diabetes mellitus with unspecified complications: Secondary | ICD-10-CM

## 2020-11-18 ENCOUNTER — Other Ambulatory Visit: Payer: Self-pay | Admitting: Physician Assistant

## 2020-11-18 DIAGNOSIS — F39 Unspecified mood [affective] disorder: Secondary | ICD-10-CM

## 2020-12-09 ENCOUNTER — Other Ambulatory Visit: Payer: Self-pay | Admitting: Physician Assistant

## 2020-12-09 DIAGNOSIS — E782 Mixed hyperlipidemia: Secondary | ICD-10-CM

## 2020-12-09 DIAGNOSIS — E1169 Type 2 diabetes mellitus with other specified complication: Secondary | ICD-10-CM

## 2020-12-09 DIAGNOSIS — I152 Hypertension secondary to endocrine disorders: Secondary | ICD-10-CM

## 2020-12-09 DIAGNOSIS — E1159 Type 2 diabetes mellitus with other circulatory complications: Secondary | ICD-10-CM

## 2021-01-08 ENCOUNTER — Other Ambulatory Visit: Payer: Self-pay | Admitting: Physician Assistant

## 2021-01-08 DIAGNOSIS — E1159 Type 2 diabetes mellitus with other circulatory complications: Secondary | ICD-10-CM

## 2021-01-08 DIAGNOSIS — I152 Hypertension secondary to endocrine disorders: Secondary | ICD-10-CM

## 2021-01-11 ENCOUNTER — Ambulatory Visit (INDEPENDENT_AMBULATORY_CARE_PROVIDER_SITE_OTHER): Payer: Medicare Other | Admitting: Physician Assistant

## 2021-01-11 ENCOUNTER — Other Ambulatory Visit: Payer: Self-pay

## 2021-01-11 ENCOUNTER — Encounter: Payer: Self-pay | Admitting: Physician Assistant

## 2021-01-11 VITALS — BP 111/69 | HR 70 | Temp 98.2°F | Ht 63.0 in | Wt 151.1 lb

## 2021-01-11 DIAGNOSIS — I152 Hypertension secondary to endocrine disorders: Secondary | ICD-10-CM

## 2021-01-11 DIAGNOSIS — E118 Type 2 diabetes mellitus with unspecified complications: Secondary | ICD-10-CM | POA: Diagnosis not present

## 2021-01-11 DIAGNOSIS — F39 Unspecified mood [affective] disorder: Secondary | ICD-10-CM

## 2021-01-11 DIAGNOSIS — E782 Mixed hyperlipidemia: Secondary | ICD-10-CM | POA: Diagnosis not present

## 2021-01-11 DIAGNOSIS — E1169 Type 2 diabetes mellitus with other specified complication: Secondary | ICD-10-CM | POA: Diagnosis not present

## 2021-01-11 DIAGNOSIS — E1159 Type 2 diabetes mellitus with other circulatory complications: Secondary | ICD-10-CM | POA: Diagnosis not present

## 2021-01-11 LAB — POCT GLYCOSYLATED HEMOGLOBIN (HGB A1C): Hemoglobin A1C: 6.9 % — AB (ref 4.0–5.6)

## 2021-01-11 LAB — POCT UA - MICROALBUMIN
Creatinine, POC: 200 mg/dL
Microalbumin Ur, POC: 150 mg/L

## 2021-01-11 NOTE — Patient Instructions (Signed)
Diabetes Mellitus and Nutrition, Adult When you have diabetes, or diabetes mellitus, it is very important to have healthy eating habits because your blood sugar (glucose) levels are greatly affected by what you eat and drink. Eating healthy foods in the right amounts, at about the same times every day, can help you:  Control your blood glucose.  Lower your risk of heart disease.  Improve your blood pressure.  Reach or maintain a healthy weight. What can affect my meal plan? Every person with diabetes is different, and each person has different needs for a meal plan. Your health care provider may recommend that you work with a dietitian to make a meal plan that is best for you. Your meal plan may vary depending on factors such as:  The calories you need.  The medicines you take.  Your weight.  Your blood glucose, blood pressure, and cholesterol levels.  Your activity level.  Other health conditions you have, such as heart or kidney disease. How do carbohydrates affect me? Carbohydrates, also called carbs, affect your blood glucose level more than any other type of food. Eating carbs naturally raises the amount of glucose in your blood. Carb counting is a method for keeping track of how many carbs you eat. Counting carbs is important to keep your blood glucose at a healthy level, especially if you use insulin or take certain oral diabetes medicines. It is important to know how many carbs you can safely have in each meal. This is different for every person. Your dietitian can help you calculate how many carbs you should have at each meal and for each snack. How does alcohol affect me? Alcohol can cause a sudden decrease in blood glucose (hypoglycemia), especially if you use insulin or take certain oral diabetes medicines. Hypoglycemia can be a life-threatening condition. Symptoms of hypoglycemia, such as sleepiness, dizziness, and confusion, are similar to symptoms of having too much  alcohol.  Do not drink alcohol if: ? Your health care provider tells you not to drink. ? You are pregnant, may be pregnant, or are planning to become pregnant.  If you drink alcohol: ? Do not drink on an empty stomach. ? Limit how much you use to:  0-1 drink a day for women.  0-2 drinks a day for men. ? Be aware of how much alcohol is in your drink. In the [place] one drink equals one 12 oz bottle of beer (355 mL), one 5 oz glass of wine (148 mL), or one 1 oz glass of hard liquor (44 mL). ? Keep yourself hydrated with water, diet soda, or unsweetened iced tea.  Keep in mind that regular soda, juice, and other mixers may contain a lot of sugar and must be counted as carbs. What are tips for following this plan? Reading food labels  Start by checking the serving size on the "Nutrition Facts" label of packaged foods and drinks. The amount of calories, carbs, fats, and other nutrients listed on the label is based on one serving of the item. Many items contain more than one serving per package.  Check the total grams (g) of carbs in one serving. You can calculate the number of servings of carbs in one serving by dividing the total carbs by 15. For example, if a food has 30 g of total carbs per serving, it would be equal to 2 servings of carbs.  Check the number of grams (g) of saturated fats and trans fats in one serving. Choose foods that have   a low amount or none of these fats.  Check the number of milligrams (mg) of salt (sodium) in one serving. Most people should limit total sodium intake to less than 2,300 mg per day.  Always check the nutrition information of foods labeled as "low-fat" or "nonfat." These foods may be higher in added sugar or refined carbs and should be avoided.  Talk to your dietitian to identify your daily goals for nutrients listed on the label. Shopping  Avoid buying canned, pre-made, or processed foods. These foods tend to be high in fat, sodium, and added  sugar.  Shop around the outside edge of the grocery store. This is where you will most often find fresh fruits and vegetables, bulk grains, fresh meats, and fresh dairy. Cooking  Use low-heat cooking methods, such as baking, instead of high-heat cooking methods like deep frying.  Cook using healthy oils, such as olive, canola, or sunflower oil.  Avoid cooking with butter, cream, or high-fat meats. Meal planning  Eat meals and snacks regularly, preferably at the same times every day. Avoid going long periods of time without eating.  Eat foods that are high in fiber, such as fresh fruits, vegetables, beans, and whole grains. Talk with your dietitian about how many servings of carbs you can eat at each meal.  Eat 4-6 oz (112-168 g) of lean protein each day, such as lean meat, chicken, fish, eggs, or tofu. One ounce (oz) of lean protein is equal to: ? 1 oz (28 g) of meat, chicken, or fish. ? 1 egg. ?  cup (62 g) of tofu.  Eat some foods each day that contain healthy fats, such as avocado, nuts, seeds, and fish.   What foods should I eat? Fruits Berries. Apples. Oranges. Peaches. Apricots. Plums. Grapes. Mango. Papaya. Pomegranate. Kiwi. Cherries. Vegetables Lettuce. Spinach. Leafy greens, including kale, chard, collard greens, and mustard greens. Beets. Cauliflower. Cabbage. Broccoli. Carrots. Green beans. Tomatoes. Peppers. Onions. Cucumbers. Brussels sprouts. Grains Whole grains, such as whole-wheat or whole-grain bread, crackers, tortillas, cereal, and pasta. Unsweetened oatmeal. Quinoa. Brown or wild rice. Meats and other proteins Seafood. Poultry without skin. Lean cuts of poultry and beef. Tofu. Nuts. Seeds. Dairy Low-fat or fat-free dairy products such as milk, yogurt, and cheese. The items listed above may not be a complete list of foods and beverages you can eat. Contact a dietitian for more information. What foods should I avoid? Fruits Fruits canned with  syrup. Vegetables Canned vegetables. Frozen vegetables with butter or cream sauce. Grains Refined white flour and flour products such as bread, pasta, snack foods, and cereals. Avoid all processed foods. Meats and other proteins Fatty cuts of meat. Poultry with skin. Breaded or fried meats. Processed meat. Avoid saturated fats. Dairy Full-fat yogurt, cheese, or milk. Beverages Sweetened drinks, such as soda or iced tea. The items listed above may not be a complete list of foods and beverages you should avoid. Contact a dietitian for more information. Questions to ask a health care provider  Do I need to meet with a diabetes educator?  Do I need to meet with a dietitian?  What number can I call if I have questions?  When are the best times to check my blood glucose? Where to find more information:  American Diabetes Association: diabetes.org  Academy of Nutrition and Dietetics: www.eatright.org  National Institute of Diabetes and Digestive and Kidney Diseases: www.niddk.nih.gov  Association of Diabetes Care and Education Specialists: www.diabeteseducator.org Summary  It is important to have healthy eating   habits because your blood sugar (glucose) levels are greatly affected by what you eat and drink.  A healthy meal plan will help you control your blood glucose and maintain a healthy lifestyle.  Your health care provider may recommend that you work with a dietitian to make a meal plan that is best for you.  Keep in mind that carbohydrates (carbs) and alcohol have immediate effects on your blood glucose levels. It is important to count carbs and to use alcohol carefully. This information is not intended to replace advice given to you by your health care provider. Make sure you discuss any questions you have with your health care provider. Document Revised: 04/15/2019 Document Reviewed: 04/15/2019 Elsevier Patient Education  2021 Elsevier Inc.  

## 2021-01-11 NOTE — Progress Notes (Signed)
Established Patient Office Visit  Subjective:  Patient ID: Rachel Camacho, female    DOB: 1945/09/04  Age: 75 y.o. MRN: [id number]  CC:  Chief Complaint  Patient presents with   Follow-up   Diabetes   Hypertension   Hyperlipidemia    HPI Rachel Camacho presents for follow up on diabetes mellitus, hypertension and hyperlipidemia. Patient has no acute concerns.  Diabetes: Pt denies increased urination or thirst. Taking medication as directed. Pt reports has not been as diligent with monitoring carbohydrate and glucose intake. Has been eating more sandwiches, rice and pasta. No hypoglycemic events. Checking glucose at home. FBS range 109-130.  HTN: Pt denies chest pain, palpitations, dizziness or lower extremity swelling. Taking medication as directed without side effects. Checks BP at home and readings range 120-130/89-90. Pt follows a low salt diet.  HLD: Pt taking medication as directed without issues. Denies side effects including myalgias and RUQ pain. States has been more active with exercise.   Mood: Denies SI/HI, mood changes or fluctuations. Taking medication without issues.  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 Intial UPC 4.7 grams. Conservative Rx. 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 Lorrene Reid)    History reviewed. No pertinent surgical history.  History reviewed. No pertinent family history.  Social History   Socioeconomic History   Marital status: Divorced    Spouse name: Not on file   Number of children: Not on file   Years of education: Not on file   Highest education level: Not on file  Occupational History   Not on file  Tobacco Use   Smoking status: Never   Smokeless tobacco: Never  Vaping Use   Vaping Use: Never used  Substance and Sexual Activity   Alcohol use: Yes     Comment: social   Drug use: No   Sexual activity: Never    Birth control/protection: None  Other Topics Concern   Not on file  Social History Narrative   Not on file   Social Determinants of Health   Financial Resource Strain: Not on file  Food Insecurity: Not on file  Transportation Needs: Not on file  Physical Activity: Not on file  Stress: Not on file  Social Connections: Not on file  Intimate Partner Violence: Not on file    Outpatient Medications Prior to Visit  Medication Sig Dispense Refill   amLODipine (NORVASC) 5 MG tablet TAKE 1 TABLET BY MOUTH  DAILY 90 tablet 0   atorvastatin (LIPITOR) 20 MG tablet TAKE 1 TABLET BY MOUTH AT  BEDTIME 90 tablet 0   Blood Glucose Monitoring Suppl (ONETOUCH VERIO) w/Device KIT Use to test glucose fating in AM and 2 hours after largest meal 1 kit 0   Calcium Carbonate-Vit D-Min (CALCIUM 1200 PO) Take 1 tablet by mouth daily.     Cholecalciferol (VITAMIN D-3) 5000 units TABS Take 1 tablet by mouth daily. 30 tablet    citalopram (CELEXA) 20 MG tablet TAKE 1 TABLET BY MOUTH  DAILY 90 tablet 0   diazepam (VALIUM) 5 MG tablet Take 1 tablet (5 mg total) by mouth every 12 (twelve) hours as needed for anxiety. 5 tablet 0   diclofenac Sodium (VOLTAREN) 1 % GEL Please specify directions, refills and quantity 100 g 0   Lancets (ONETOUCH DELICA PLUS [id number]) MISC USE  2 DAILY AS DIRECTED 200 each 3   losartan (COZAAR) 100 MG tablet TAKE 1 TABLET BY MOUTH  DAILY 90 tablet 0   metFORMIN (GLUCOPHAGE-XR) 500 MG 24 hr tablet TAKE 2 TABLETS BY MOUTH  DAILY AFTER SUPPER 180 tablet 0   ONETOUCH VERIO test strip USE TO CHECK FASTING GLUCOSE IN THE AM AND TO CHECK 2 HOURS AFTER LARGEST MEAL OF THE DAY. 100 each 12   triamcinolone cream (KENALOG) 0.1 % Apply 1 application topically 2 (two) times daily. 30 g 0   No facility-administered medications prior to visit.    Allergies  Allergen Reactions   Penicillins Hives    ROS Review of Systems Review  of Systems:  A fourteen system review of systems was performed and found to be positive as per HPI.    Objective:    Physical Exam General:  Well Developed, well nourished, appropriate for stated age.  Neuro:  Alert and oriented,  extra-ocular muscles intact  HEENT:  Normocephalic, atraumatic, neck supple Skin:  no gross rash, warm, pink. Cardiac:  RRR, S1 S2 Respiratory:  CTA B/L, Not using accessory muscles, speaking in full sentences- unlabored. Vascular:  Ext warm, no cyanosis apprec.; cap RF less 2 sec. Psych:  No HI/SI, judgement and insight good, Euthymic mood. Full Affect.  BP 111/69   Pulse 70   Temp 98.2 F (36.8 C)   Ht '5\' 3"'  (1.6 m)   Wt 151 lb 1.6 oz (68.5 kg)   SpO2 100%   BMI 26.77 kg/m  Wt Readings from Last 3 Encounters:  01/11/21 151 lb 1.6 oz (68.5 kg)  10/11/20 156 lb (70.8 kg)  08/27/20 155 lb (70.3 kg)     Health Maintenance Due  Topic Date Due   TETANUS/TDAP  Never done   Zoster Vaccines- Shingrix (1 of 2) Never done   FOOT EXAM  10/05/2018   INFLUENZA VACCINE  12/20/2020    There are no preventive care reminders to display for this patient.  Lab Results  Component Value Date   TSH 2.220 02/10/2020   Lab Results  Component Value Date   WBC 6.7 02/10/2020   HGB 13.0 02/10/2020   HCT 39.4 02/10/2020   MCV 89 02/10/2020   PLT 296 02/10/2020   Lab Results  Component Value Date   NA 141 02/10/2020   K 4.2 02/10/2020   CO2 24 02/10/2020   GLUCOSE 94 02/10/2020   BUN 15 02/10/2020   CREATININE 0.82 02/10/2020   BILITOT 0.6 02/10/2020   ALKPHOS 70 02/10/2020   AST 18 02/10/2020   ALT 14 02/10/2020   PROT 6.7 02/10/2020   ALBUMIN 4.3 02/10/2020   CALCIUM 9.1 02/10/2020   Lab Results  Component Value Date   CHOL 122 02/10/2020   Lab Results  Component Value Date   HDL 58 02/10/2020   Lab Results  Component Value Date   LDLCALC 50 02/10/2020   Lab Results  Component Value Date   TRIG 64 02/10/2020   Lab Results   Component Value Date   CHOLHDL 2.1 02/10/2020   Lab Results  Component Value Date   HGBA1C 6.9 (A) 01/11/2021      Assessment & Plan:   Problem List Items Addressed This Visit       Cardiovascular and Mediastinum   Hypertension associated with diabetes ([place]) (Chronic)     Endocrine   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 If A1c fails to improve recommend treatment adjustments by increasing Metformin. Patient verbalized understanding. -Continue current medication regimen. -UA microalbumin collected, A:C 30-300 mg/g, stable from prior. Will continue ARB. -Will continue to monitor.  Hypertension associated with diabetes: -BP in office at goal. -Continue current medication regimen. -Will continue to monitor. Will collect CMP with lab visit.  Mixed diabetic hyperlipidemia associated with type 2 diabetes mellitus: -Last lipid panel: total cholesterol 122, triglycerides 64, HDL 58, LDL 50 (at goal <70). -Continue current medication regimen. -Will repeat lipid panel and hepatic function with lab visit.  Mood disorder: -PHQ-9 score of 0, GAD-7 score of 1, stable. -Continue current medication regimen.   No orders of the defined types were placed in this encounter.   Follow-up: Return in about 4 months (around 05/13/2021) for DM, HTN, HLD; lab visit in 1-3 weeks for FBW (not A1c).     Lorrene Reid, PA-C

## 2021-01-28 ENCOUNTER — Other Ambulatory Visit: Payer: Self-pay | Admitting: Physician Assistant

## 2021-01-28 DIAGNOSIS — E118 Type 2 diabetes mellitus with unspecified complications: Secondary | ICD-10-CM

## 2021-02-08 ENCOUNTER — Other Ambulatory Visit: Payer: Self-pay | Admitting: Physician Assistant

## 2021-02-08 DIAGNOSIS — F39 Unspecified mood [affective] disorder: Secondary | ICD-10-CM

## 2021-03-01 ENCOUNTER — Other Ambulatory Visit: Payer: Self-pay | Admitting: Physician Assistant

## 2021-03-01 DIAGNOSIS — E1169 Type 2 diabetes mellitus with other specified complication: Secondary | ICD-10-CM

## 2021-04-03 ENCOUNTER — Other Ambulatory Visit: Payer: Self-pay | Admitting: Physician Assistant

## 2021-04-03 DIAGNOSIS — I152 Hypertension secondary to endocrine disorders: Secondary | ICD-10-CM

## 2021-04-27 ENCOUNTER — Other Ambulatory Visit: Payer: Self-pay | Admitting: Physician Assistant

## 2021-04-27 DIAGNOSIS — E118 Type 2 diabetes mellitus with unspecified complications: Secondary | ICD-10-CM

## 2021-05-04 ENCOUNTER — Other Ambulatory Visit: Payer: Self-pay | Admitting: Physician Assistant

## 2021-05-04 DIAGNOSIS — F39 Unspecified mood [affective] disorder: Secondary | ICD-10-CM

## 2021-05-05 ENCOUNTER — Other Ambulatory Visit: Payer: Self-pay | Admitting: Physician Assistant

## 2021-05-05 DIAGNOSIS — I152 Hypertension secondary to endocrine disorders: Secondary | ICD-10-CM

## 2021-05-18 ENCOUNTER — Ambulatory Visit (INDEPENDENT_AMBULATORY_CARE_PROVIDER_SITE_OTHER): Payer: Medicare Other | Admitting: Physician Assistant

## 2021-05-18 ENCOUNTER — Encounter: Payer: Self-pay | Admitting: Physician Assistant

## 2021-05-18 ENCOUNTER — Other Ambulatory Visit: Payer: Self-pay

## 2021-05-18 VITALS — BP 112/69 | HR 68 | Temp 97.6°F | Ht 63.0 in | Wt 153.0 lb

## 2021-05-18 DIAGNOSIS — R5383 Other fatigue: Secondary | ICD-10-CM

## 2021-05-18 DIAGNOSIS — M25532 Pain in left wrist: Secondary | ICD-10-CM

## 2021-05-18 DIAGNOSIS — E1169 Type 2 diabetes mellitus with other specified complication: Secondary | ICD-10-CM | POA: Diagnosis not present

## 2021-05-18 DIAGNOSIS — R7309 Other abnormal glucose: Secondary | ICD-10-CM

## 2021-05-18 DIAGNOSIS — E559 Vitamin D deficiency, unspecified: Secondary | ICD-10-CM | POA: Diagnosis not present

## 2021-05-18 DIAGNOSIS — E1159 Type 2 diabetes mellitus with other circulatory complications: Secondary | ICD-10-CM | POA: Diagnosis not present

## 2021-05-18 DIAGNOSIS — E0822 Diabetes mellitus due to underlying condition with diabetic chronic kidney disease: Secondary | ICD-10-CM | POA: Diagnosis not present

## 2021-05-18 DIAGNOSIS — R42 Dizziness and giddiness: Secondary | ICD-10-CM | POA: Diagnosis not present

## 2021-05-18 LAB — POCT INFLUENZA A/B
Influenza A, POC: NEGATIVE
Influenza B, POC: NEGATIVE

## 2021-05-18 NOTE — Patient Instructions (Signed)
Wrist Pain, Adult There are many things that can cause wrist pain. Some common causes include: An injury to the wrist area, such as a sprain, strain, or fracture. Overuse of the joint. A condition that causes increased pressure on a nerve in the wrist (carpal tunnel syndrome). Wear and tear of the joints that occurs with aging (osteoarthritis). Other types of joint inflammation and stiffness (arthritis). Sometimes, the cause of wrist pain is not known. Often, the pain goes away when you follow instructions from your health care provider for relieving pain at home, such as resting the wrist, icing the wrist, or using a splint or an elastic wrap for a short time. If your wrist pain continues, it is important to tell your health care provider. Follow these instructions at home: If you have a splint or elastic wrap: Wear the splint or wrap as told by your health care provider. Remove it only as told by your health care provider. Ask your health care provider if you may remove it for bathing. Loosen the splint or wrap if your fingers tingle, become numb, or turn cold and blue. Check the skin around the splint or wrap every day. Tell your health care provider about any concerns. Keep the splint or wrap clean. If the splint or wrap is not waterproof: Do not let it get wet. Cover it with a watertight covering when you take a bath or shower. Managing pain, stiffness, and swelling  If directed, put ice on the painful area. To do this: If you have a removable splint or wrap, remove it as told by your health care provider. Put ice in a plastic bag. Place a towel between your skin and the bag or between your splint or wrap and the bag. Leave the ice on for 20 minutes, 2-3 times a day. Move your fingers often to reduce stiffness and swelling. Raise (elevate) the injured area above the level of your heart while you are sitting or lying down. Activity Rest your affected wrist as told by your health care  provider. Return to your normal activities as told by your health care provider. Ask your health care provider what activities are safe for you. Ask your health care provider when it is safe to drive if you have a splint or wrap on your wrist. Do exercises as told by your health care provider. General instructions Pay attention to any changes in your symptoms. Take over-the-counter and prescription medicines only as told by your health care provider. Keep all follow-up visits as told by your health care provider. This is important. Contact a health care provider if: You have a sudden, sharp pain in the wrist, hand, or arm that is different or new. The swelling or bruising on your wrist or hand gets worse. Your skin becomes red, gets a rash, or has open sores. Your pain does not get better or it gets worse. You have a fever or chills. Get help right away if: You lose feeling in your fingers or hand. Your fingers turn white, very red, or cold and blue. You cannot move your fingers. Summary Wrist pain in an adult has many different causes. If your wrist pain continues, it is important to tell your health care provider. You may need to wear a splint or an elastic wrap for a short period of time. Return to your normal activities as told by your health care provider. Ask your health care provider what activities are safe for you. This information is not intended  to replace advice given to you by your health care provider. Make sure you discuss any questions you have with your health care provider. Document Revised: 03/27/2019 Document Reviewed: 03/27/2019 Elsevier Patient Education  2022 [employer].

## 2021-05-18 NOTE — Progress Notes (Signed)
Acute Office Visit  Subjective:    Patient ID: Rachel Camacho, female    DOB: [birth year], 75 y.o.   MRN: [id number]  Chief Complaint  Patient presents with   Acute Visit   Fatigue    HPI Patient is in today for c/o fatigue, loss of appetite, dizziness and diarrhea-1 episode that started after eating steak. Symptoms have been intermittent and going on for 2 weeks. States today feels better and her symptoms have improved. Denies cough, runny nose, fever, abdominal pain, sore throat, n/v/, altered mental status, chest pain, palpitations or syncope. Denies starting new medications or supplements. Patient did an at-home Covid test last Friday which resulted negative. Reports trying to stay hydrated with soup, water and tea. Patient also has c/o left wrist pain. States pain is aggravated when putting pressure on her wrist such as lifting. No injury or fall. Pain can radiate to thumb. Patient states she types a lot.  Denies numbness or tingling sensation.  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 Intial UPC 4.7 grams. Conservative Rx. 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 Lorrene Reid)    History reviewed. No pertinent surgical history.  History reviewed. No pertinent family history.  Social History   Socioeconomic History   Marital status: Divorced    Spouse name: Not on file   Number of children: Not on file   Years of education: Not on file   Highest education level: Not on file  Occupational History   Not on file  Tobacco Use   Smoking status: Never   Smokeless tobacco: Never  Vaping Use   Vaping Use: Never used  Substance and Sexual Activity   Alcohol use: Yes    Comment: social   Drug use: No   Sexual activity: Never    Birth control/protection: None  Other Topics Concern   Not on file  Social  History Narrative   Not on file   Social Determinants of Health   Financial Resource Strain: Not on file  Food Insecurity: Not on file  Transportation Needs: Not on file  Physical Activity: Not on file  Stress: Not on file  Social Connections: Not on file  Intimate Partner Violence: Not on file    Outpatient Medications Prior to Visit  Medication Sig Dispense Refill   amLODipine (NORVASC) 5 MG tablet TAKE 1 TABLET BY MOUTH  DAILY 90 tablet 0   atorvastatin (LIPITOR) 20 MG tablet TAKE 1 TABLET BY MOUTH AT  BEDTIME 90 tablet 0   Blood Glucose Monitoring Suppl (ONETOUCH VERIO) w/Device KIT Use to test glucose fating in AM and 2 hours after largest meal 1 kit 0   Calcium Carbonate (CALCIUM 500 PO) Take by mouth.     Cholecalciferol (VITAMIN D-3) 5000 units TABS Take 1 tablet by mouth daily. 30 tablet    citalopram (CELEXA) 20 MG tablet Take 1 tablet (20 mg total) by mouth daily. **PLEASE CONTACT OUR OFFICE TO SCHEDULE A FOLLOW UP FOR FUTURE MED REFILLS** 30 tablet 0   diazepam (VALIUM) 5 MG tablet Take 1 tablet (5 mg total) by mouth every 12 (twelve) hours as needed for anxiety. 5 tablet 0   diclofenac Sodium (VOLTAREN) 1 % GEL Please specify directions, refills and quantity 100 g 0   Lancets (ONETOUCH DELICA PLUS [id number]) MISC USE  2 DAILY AS DIRECTED 200 each 3   losartan (COZAAR) 100 MG tablet Take 1 tablet (100 mg total) by mouth daily. **PLEASE CONTACT OUR OFFICE TO SCHEDULE A FOLLOW UP FOR FUTURE MED REFILLS** 30 tablet 0   metFORMIN (GLUCOPHAGE-XR) 500 MG 24 hr tablet TAKE 2 TABLETS BY MOUTH  DAILY AFTER SUPPER 60 tablet 0   ONETOUCH VERIO test strip USE TO CHECK FASTING GLUCOSE IN THE AM AND TO CHECK 2 HOURS AFTER LARGEST MEAL OF THE DAY. 100 each 12   triamcinolone cream (KENALOG) 0.1 % Apply 1 application topically 2 (two) times daily. 30 g 0   Calcium Carbonate-Vit D-Min (CALCIUM 1200 PO) Take 1 tablet by mouth daily.     No facility-administered medications prior to visit.     Allergies  Allergen Reactions   Penicillins Hives    Review of Systems Review of Systems:  A fourteen system review of systems was performed and found to be positive as per HPI.    Objective:    Physical Exam General:  Well Developed, well nourished, appropriate for stated age.  Neuro:  Alert and oriented,  extra-ocular muscles intact, no nystagmus with peripheral gaze noted  HEENT:  Normocephalic, atraumatic, neck supple, no sinus tenderness, PERRL, cerumen impaction of both ears, pink and moist nasal mucosa, normal posterior oropharynx, no cervical adenopathy   Skin:  no gross rash, warm, pink. Abdomen: Soft, non-tender, non-distended  Cardiac:  RRR, S1 S2 Respiratory: CTA B/L w/o wheezing, crackles or rales. MSK: no tenderness of both wrists with palpation, good ROM of both wrists, good grip strength b/l, no deformity, negative Tinel's sign, +Finkelstein sign (left), normal gait Vascular:  Ext warm, no cyanosis apprec.; cap RF less 2 sec. 2+ radial pulse b/l  Psych:  No HI/SI, judgement and insight good, Euthymic mood. Full Affect.  BP 112/69    Pulse 68    Temp 97.6 F (36.4 C)    Ht 5' 3" (1.6 m)    Wt 153 lb (69.4 kg)    SpO2 97%    BMI 27.10 kg/m  Wt Readings from Last 3 Encounters:  05/18/21 153 lb (69.4 kg)  01/11/21 151 lb 1.6 oz (68.5 kg)  10/11/20 156 lb (70.8 kg)    Health Maintenance Due  Topic Date Due   TETANUS/TDAP  Never done   Zoster Vaccines- Shingrix (1 of 2) Never done   FOOT EXAM  10/05/2018   COVID-19 Vaccine (4 - Booster for Pfizer series) 12/02/2020   INFLUENZA VACCINE  12/20/2020   OPHTHALMOLOGY EXAM  04/02/2021    There are no preventive care reminders to display for this patient.   Lab Results  Component Value Date   TSH 2.220 02/10/2020   Lab Results  Component Value Date   WBC 6.7 02/10/2020   HGB 13.0 02/10/2020   HCT 39.4 02/10/2020   MCV 89 02/10/2020   PLT 296 02/10/2020   Lab Results  Component Value Date   NA 141  02/10/2020   K 4.2 02/10/2020   CO2 24 02/10/2020   GLUCOSE 94 02/10/2020   BUN 15 02/10/2020   CREATININE 0.82 02/10/2020   BILITOT 0.6 02/10/2020   ALKPHOS 70 02/10/2020   AST 18 02/10/2020   ALT 14 02/10/2020   PROT 6.7 02/10/2020   ALBUMIN 4.3 02/10/2020   CALCIUM 9.1 02/10/2020   Lab Results  Component Value Date   CHOL 122 02/10/2020   Lab Results  Component Value Date   HDL 58 02/10/2020  Lab Results  Component Value Date   LDLCALC 50 02/10/2020   Lab Results  Component Value Date   TRIG 64 02/10/2020   Lab Results  Component Value Date   CHOLHDL 2.1 02/10/2020   Lab Results  Component Value Date   HGBA1C 6.9 (A) 01/11/2021       Assessment & Plan:   Problem List Items Addressed This Visit   None Visit Diagnoses     Fatigue, unspecified type    -  Primary   Relevant Orders   POCT Influenza A/B (Completed)   CBC w/Diff   Comp Met (CMET)   HgB A1c   TSH   Vitamin D (25 hydroxy)   Left wrist pain       Dizziness       Relevant Orders   CBC w/Diff   Comp Met (CMET)   HgB A1c   TSH   Elevated hemoglobin A1c       Relevant Orders   HgB A1c       Fatigue, Dizziness: -Discussed with patient etiology of symptoms unclear, possibly viral. Symptoms have improved. Influenza test resulted negative. Will place lab orders to evaluate for leukocytosis, nutritional deficiency, endocrine or metabolic etiology. Last A1c 6.9, will repeat A1c.  Recommend to continue home supportive care.   Left wrist pain: -Discussed with patient s/sx suggestive of de Quervain's tenosynovitis. Recommend conservative therapy with heat therapy, rest and avoiding exacerbating activities, use of thumb spica splint and NSAID (BP controlled) x 4-6 weeks. If symptoms fail to improve or worsen recommend referral to hand specialist.    No orders of the defined types were placed in this encounter.    Lorrene Reid, PA-C

## 2021-05-19 LAB — CBC WITH DIFFERENTIAL/PLATELET
Basophils Absolute: 0 10*3/uL (ref 0.0–0.2)
Basos: 1 %
EOS (ABSOLUTE): 0.2 10*3/uL (ref 0.0–0.4)
Eos: 3 %
Hematocrit: 40.6 % (ref 34.0–46.6)
Hemoglobin: 13.7 g/dL (ref 11.1–15.9)
Immature Grans (Abs): 0 10*3/uL (ref 0.0–0.1)
Immature Granulocytes: 0 %
Lymphocytes Absolute: 2.4 10*3/uL (ref 0.7–3.1)
Lymphs: 36 %
MCH: 29.1 pg (ref 26.6–33.0)
MCHC: 33.7 g/dL (ref 31.5–35.7)
MCV: 86 fL (ref 79–97)
Monocytes Absolute: 0.4 10*3/uL (ref 0.1–0.9)
Monocytes: 7 %
Neutrophils Absolute: 3.6 10*3/uL (ref 1.4–7.0)
Neutrophils: 53 %
Platelets: 288 10*3/uL (ref 150–450)
RBC: 4.71 x10E6/uL (ref 3.77–5.28)
RDW: 12.9 % (ref 11.7–15.4)
WBC: 6.6 10*3/uL (ref 3.4–10.8)

## 2021-05-19 LAB — COMPREHENSIVE METABOLIC PANEL
ALT: 14 IU/L (ref 0–32)
AST: 18 IU/L (ref 0–40)
Albumin/Globulin Ratio: 2.2 (ref 1.2–2.2)
Albumin: 5 g/dL — ABNORMAL HIGH (ref 3.7–4.7)
Alkaline Phosphatase: 81 IU/L (ref 44–121)
BUN/Creatinine Ratio: 20 (ref 12–28)
BUN: 16 mg/dL (ref 8–27)
Bilirubin Total: 0.8 mg/dL (ref 0.0–1.2)
CO2: 24 mmol/L (ref 20–29)
Calcium: 9.7 mg/dL (ref 8.7–10.3)
Chloride: 101 mmol/L (ref 96–106)
Creatinine, Ser: 0.81 mg/dL (ref 0.57–1.00)
Globulin, Total: 2.3 g/dL (ref 1.5–4.5)
Glucose: 97 mg/dL (ref 70–99)
Potassium: 4.2 mmol/L (ref 3.5–5.2)
Sodium: 140 mmol/L (ref 134–144)
Total Protein: 7.3 g/dL (ref 6.0–8.5)
eGFR: 75 mL/min/{1.73_m2} (ref >59)

## 2021-05-19 LAB — VITAMIN D 25 HYDROXY (VIT D DEFICIENCY, FRACTURES): Vit D, 25-Hydroxy: 80.4 ng/mL (ref 30.0–100.0)

## 2021-05-19 LAB — HEMOGLOBIN A1C
Est. average glucose Bld gHb Est-mCnc: 140 mg/dL
Hgb A1c MFr Bld: 6.5 % — ABNORMAL HIGH (ref 4.8–5.6)

## 2021-05-19 LAB — TSH: TSH: 1.49 u[IU]/mL (ref 0.450–4.500)

## 2021-05-24 DIAGNOSIS — H25013 Cortical age-related cataract, bilateral: Secondary | ICD-10-CM | POA: Diagnosis not present

## 2021-05-24 DIAGNOSIS — E119 Type 2 diabetes mellitus without complications: Secondary | ICD-10-CM | POA: Diagnosis not present

## 2021-05-24 LAB — HM DIABETES EYE EXAM

## 2021-05-25 ENCOUNTER — Encounter: Payer: Self-pay | Admitting: Physician Assistant

## 2021-06-17 ENCOUNTER — Other Ambulatory Visit: Payer: Self-pay | Admitting: Physician Assistant

## 2021-06-17 DIAGNOSIS — E1159 Type 2 diabetes mellitus with other circulatory complications: Secondary | ICD-10-CM

## 2021-06-17 DIAGNOSIS — F39 Unspecified mood [affective] disorder: Secondary | ICD-10-CM

## 2021-06-17 DIAGNOSIS — I152 Hypertension secondary to endocrine disorders: Secondary | ICD-10-CM

## 2021-07-05 DIAGNOSIS — H04123 Dry eye syndrome of bilateral lacrimal glands: Secondary | ICD-10-CM | POA: Diagnosis not present

## 2021-07-05 DIAGNOSIS — H25013 Cortical age-related cataract, bilateral: Secondary | ICD-10-CM | POA: Diagnosis not present

## 2021-07-12 ENCOUNTER — Encounter: Payer: Self-pay | Admitting: Physician Assistant

## 2021-07-12 ENCOUNTER — Ambulatory Visit (INDEPENDENT_AMBULATORY_CARE_PROVIDER_SITE_OTHER): Payer: Medicare Other | Admitting: Physician Assistant

## 2021-07-12 ENCOUNTER — Other Ambulatory Visit: Payer: Self-pay

## 2021-07-12 VITALS — BP 111/71 | HR 80 | Temp 97.3°F | Wt 148.0 lb

## 2021-07-12 DIAGNOSIS — E1159 Type 2 diabetes mellitus with other circulatory complications: Secondary | ICD-10-CM

## 2021-07-12 DIAGNOSIS — M25561 Pain in right knee: Secondary | ICD-10-CM | POA: Diagnosis not present

## 2021-07-12 DIAGNOSIS — I152 Hypertension secondary to endocrine disorders: Secondary | ICD-10-CM

## 2021-07-12 DIAGNOSIS — E1169 Type 2 diabetes mellitus with other specified complication: Secondary | ICD-10-CM

## 2021-07-12 DIAGNOSIS — E118 Type 2 diabetes mellitus with unspecified complications: Secondary | ICD-10-CM

## 2021-07-12 DIAGNOSIS — E782 Mixed hyperlipidemia: Secondary | ICD-10-CM

## 2021-07-12 DIAGNOSIS — L509 Urticaria, unspecified: Secondary | ICD-10-CM

## 2021-07-12 DIAGNOSIS — F39 Unspecified mood [affective] disorder: Secondary | ICD-10-CM

## 2021-07-12 MED ORDER — IBUPROFEN 600 MG PO TABS: 600.0000 mg | ORAL_TABLET | Freq: Three times a day (TID) | ORAL | 0 refills | Status: DC | PRN

## 2021-07-12 NOTE — Assessment & Plan Note (Signed)
-  Last lipid panel wnl's. -Will repeat lipid panel and hepatic function with [facility]. -Will continue to monitor.

## 2021-07-12 NOTE — Progress Notes (Signed)
Established patient visit   Patient: Rachel Camacho   DOB: 28-Mar-1946   76 y.o. Female  MRN: [id number] Visit Date: 07/12/2021  Chief Complaint  Patient presents with   Follow-up   Diabetes   Hyperlipidemia   Hypertension   Subjective    HPI  Patient presents for follow-up on diabetes mellitus, hypertension and hyperlipidemia. Patient reports has an itchy rash over left shoulder. Denies new soaps, detergents or major lifestyle changes. No shortness of breath, facial or tongue swelling. Also states continues to have intermittent right knee pain which is lateral and sometimes feels swelling behind her knee. Walking can exacerbate her pain. States today her knee is good. Takes ibuprofen 600 mg when needed for moderate to severe pain which helps.  Diabetes: Pt denies increased urination or thirst. Pt reports medication compliance. No hypoglycemic events. Checking glucose at home. FBS range from from 120-130.  HTN: Pt denies chest pain, palpitations, dizziness or leg swelling. Taking medication as directed without side effects. Checks BP at home daily and readings range <130/80. Pt follows a low salt diet.  HLD: Pt taking medication as directed without issues. Tries to follow a balance diet.   Mood: Patient reports mood has been stable. States some days might feel more depressed but not often. Denies frequent mood fluctuations, SI/HI. Reports medication compliance.  Depression screen [facility] 2/9 07/12/2021 05/18/2021 01/11/2021 10/11/2020 08/27/2020  Decreased Interest 1 0 0 0 0  Down, Depressed, Hopeless 0 0 0 0 0  PHQ - 2 Score 1 0 0 0 0  Altered sleeping 0 0 0 0 1  Tired, decreased energy 1 1 0 2 0  Change in appetite 1 1 0 0 0  Feeling bad or failure about yourself  0 0 0 0 0  Trouble concentrating 1 0 0 0 0  Moving slowly or fidgety/restless 0 0 0 0 0  Suicidal thoughts 0 0 0 0 0  PHQ-9 Score 4 2 0 2 1  Difficult doing work/chores Somewhat difficult Somewhat difficult - Somewhat difficult  -  Some recent data might be hidden   GAD 7 : Generalized Anxiety Score 07/12/2021 05/18/2021 01/11/2021 10/11/2020  Nervous, Anxious, on Edge 1 0 0 0  Control/stop worrying 0 0 0 0  Worry too much - different things 0 0 0 0  Trouble relaxing 1 0 1 0  Restless 0 0 0 0  Easily annoyed or irritable 0 0 0 0  Afraid - awful might happen 0 0 0 0  Total GAD 7 Score 2 0 1 0  Anxiety Difficulty - Not difficult at all - -       Medications: Outpatient Medications Prior to Visit  Medication Sig   amLODipine (NORVASC) 5 MG tablet TAKE 1 TABLET BY MOUTH  DAILY   atorvastatin (LIPITOR) 20 MG tablet TAKE 1 TABLET BY MOUTH AT  BEDTIME   Blood Glucose Monitoring Suppl (ONETOUCH VERIO) w/Device KIT Use to test glucose fating in AM and 2 hours after largest meal   Calcium Carbonate (CALCIUM 500 PO) Take by mouth.   Cholecalciferol (VITAMIN D-3) 5000 units TABS Take 1 tablet by mouth daily.   citalopram (CELEXA) 20 MG tablet Take 1 tablet (20 mg total) by mouth daily. **PLEASE CONTACT OUR OFFICE TO SCHEDULE A FOLLOW UP FOR FUTURE MED REFILLS**   diazepam (VALIUM) 5 MG tablet Take 1 tablet (5 mg total) by mouth every 12 (twelve) hours as needed for anxiety.   diclofenac Sodium (VOLTAREN) 1 % GEL Please specify  directions, refills and quantity   Lancets (ONETOUCH DELICA PLUS [id number]) MISC USE 2 DAILY AS DIRECTED   losartan (COZAAR) 100 MG tablet Take 1 tablet (100 mg total) by mouth daily. **PLEASE CONTACT OUR OFFICE TO SCHEDULE A FOLLOW UP FOR FUTURE MED REFILLS**   metFORMIN (GLUCOPHAGE-XR) 500 MG 24 hr tablet TAKE 2 TABLETS BY MOUTH  DAILY AFTER SUPPER   ONETOUCH VERIO test strip USE TO CHECK FASTING GLUCOSE IN THE AM AND TO CHECK 2 HOURS AFTER LARGEST MEAL OF THE DAY.   triamcinolone cream (KENALOG) 0.1 % Apply 1 application topically 2 (two) times daily.   No facility-administered medications prior to visit.    Review of Systems Review of Systems:  A fourteen system review of systems was  performed and found to be positive as per HPI.     Objective    BP 111/71    Pulse 80    Temp (!) 97.3 F (36.3 C)    Wt 148 lb (67.1 kg)    SpO2 98%    BMI 26.22 kg/m  BP Readings from Last 3 Encounters:  07/12/21 111/71  05/18/21 112/69  01/11/21 111/69   Wt Readings from Last 3 Encounters:  07/12/21 148 lb (67.1 kg)  05/18/21 153 lb (69.4 kg)  01/11/21 151 lb 1.6 oz (68.5 kg)    Physical Exam  General:  Well Developed, well nourished, appropriate for stated age.  Neuro:  Alert and oriented,  extra-ocular muscles intact  HEENT:  Normocephalic, atraumatic, neck supple  Skin:  small erythematous raised lesions over left upper shoulder  Cardiac:  RRR, S1 S2 Respiratory: CTA B/L  MSK: good ROM of both knees, no crepitus, no joint line tenderness, tightness of lateral-posterior knee over IT band Vascular:  Ext warm, no cyanosis apprec.; cap RF less 2 sec. Psych:  No HI/SI, judgement and insight good, Euthymic mood. Full Affect.   No results found for any visits on 07/12/21.  Assessment & Plan      Problem List Items Addressed This Visit       Cardiovascular and Mediastinum   Hypertension associated with diabetes ([place]) (Chronic)    -Controlled. -Continue current medication regimen. -Will repeat CMP for medication monitoring with [facility]. -Will continue to monitor.        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 -Will repeat lipid panel and hepatic function with [facility]. -Will continue to monitor.      Diabetes mellitus with complication ([place]) - Primary    -A1c from 05/18/2021 improved from 6.9 to 6.5, will repeat A1c with [facility]. Continue current medication regimen, see med list. Recommend to continue diabetic diet and ambulatory glucose monitoring. Will continue to monitor.        Other   Mood disorder ([facility]) (Chronic)    -Stable. -Continue current medication regimen. -Will continue  to monitor.      Other Visit Diagnoses     Right knee pain, unspecified chronicity       Relevant Medications   ibuprofen (ADVIL) 600 MG tablet   Hives          Hives: -Discussed with patient rash is consistent with hives. Patient has no red flag s/sx for anaphylaxis. Advised to apply topical corticosteroid cream and/or take benadryl/Zyrtec as needed for itching. Follow-up if symptoms fail to improve or worsen.  Right knee pain: -Discussed with patient lateral knee pain is suggestive of IT band syndrome. Currently  asymptomatic. Provided handout. Continue ibuprofen 400-600 mg as needed and if symptoms fail to improve recommend to consider referral to PT or Orthopedics.    Return in about 3 months (around 10/09/2021) for [place] and [place].        Lorrene Reid, PA-C  [facility] [phone] (phone) [phone] (fax)  [place]

## 2021-07-12 NOTE — Assessment & Plan Note (Addendum)
-  A1c from 05/18/2021 improved from 6.9 to 6.5, will repeat A1c with [facility]. Continue current medication regimen, see med list. Recommend to continue diabetic diet and ambulatory glucose monitoring. Will continue to monitor.

## 2021-07-12 NOTE — Assessment & Plan Note (Signed)
-  Controlled. -Continue current medication regimen. -Will repeat CMP for medication monitoring with [facility]. -Will continue to monitor.

## 2021-07-12 NOTE — Assessment & Plan Note (Signed)
>>  ASSESSMENT AND PLAN FOR DIABETES MELLITUS WITH COMPLICATION ([facility]) WRITTEN ON 07/17/2021  9:12 PM BY ABONZA, MARITZA, PA-C  -A1c from 05/18/2021 improved from 6.9 to 6.5, will repeat A1c with [facility]. Continue current medication regimen, see med list. Recommend to continue diabetic diet and ambulatory glucose monitoring. Will continue to monitor.

## 2021-07-12 NOTE — Patient Instructions (Addendum)
Iliotibial Band Syndrome Rehab Ask your health care provider which exercises are safe for you. Do exercises exactly as told by your health care provider and adjust them as directed. It is normal to feel mild stretching, pulling, tightness, or discomfort as you do these exercises. Stop right away if you feel sudden pain or your pain gets significantly worse. Do not begin these exercises until told by your health care provider. Stretching and range-of-motion exercises These exercises warm up your muscles and joints and improve the movement andflexibility of your hip and pelvis. Quadriceps stretch, prone  Lie on your abdomen (prone position) on a firm surface, such as a bed or padded floor. Bend your left / right knee and reach back to hold your ankle or pant leg. If you cannot reach your ankle or pant leg, loop a belt around your foot and grab the belt instead. Gently pull your heel toward your buttocks. Your knee should not slide out to the side. You should feel a stretch in the front of your thigh and knee (quadriceps). Hold this position for __________ seconds. Repeat __________ times. Complete this exercise __________ times a day. Iliotibial band stretch An iliotibial band is a strong band of muscle tissue that runs from the outer side of your hip to the outer side of your thigh and knee. Lie on your side with your left / right leg in the top position. Bend both of your knees and grab your left / right ankle. Stretch out your bottom arm to help you balance. Slowly bring your top knee back so your thigh goes behind your trunk. Slowly lower your top leg toward the floor until you feel a gentle stretch on the outside of your left / right hip and thigh. If you do not feel a stretch and your knee will not fall farther, place the heel of your other foot on top of your knee and pull your knee down toward the floor with your foot. Hold this position for __________ seconds. Repeat __________ times.  Complete this exercise __________ times a day. Strengthening exercises These exercises build strength and endurance in your hip and pelvis. Enduranceis the ability to use your muscles for a long time, even after they get tired. Straight leg raises, side-lying This exercise strengthens the muscles that rotate the leg at the hip and move it away from your body (hip abductors). Lie on your side with your left / right leg in the top position. Lie so your head, shoulder, hip, and knee line up. You may bend your bottom knee to help you balance. Roll your hips slightly forward so your hips are stacked directly over each other and your left / right knee is facing forward. Tense the muscles in your outer thigh and lift your top leg 4-6 inches (10-15 cm). Hold this position for __________ seconds. Slowly lower your leg to return to the starting position. Let your muscles relax completely before doing another repetition. Repeat __________ times. Complete this exercise __________ times a day. Leg raises, prone This exercise strengthens the muscles that move the hips backward (hip extensors). Lie on your abdomen (prone position) on your bed or a firm surface. You can put a pillow under your hips if that is more comfortable for your lower back. Bend your left / right knee so your foot is straight up in the air. Squeeze your buttocks muscles and lift your left / right thigh off the bed. Do not let your back arch. Tense your thigh   muscle as hard as you can without increasing any knee pain. Hold this position for __________ seconds. Slowly lower your leg to return to the starting position and allow it to relax completely. Repeat __________ times. Complete this exercise __________ times a day. Hip hike Stand sideways on a bottom step. Stand on your left / right leg with your other foot unsupported next to the step. You can hold on to a railing or wall for balance if needed. Keep your knees straight and your  torso square. Then lift your left / right hip up toward the ceiling. Slowly let your left / right hip lower toward the floor, past the starting position. Your foot should get closer to the floor. Do not lean or bend your knees. Repeat __________ times. Complete this exercise __________ times a day. This information is not intended to replace advice given to you by your health care provider. Make sure you discuss any questions you have with your healthcare provider. Document Revised: 07/16/2019 Document Reviewed: 07/16/2019 Elsevier Patient Education  2022 [employer] Inc.  

## 2021-07-17 NOTE — Assessment & Plan Note (Signed)
-  Stable. -Continue current medication regimen.  -Will continue to monitor. 

## 2021-07-22 ENCOUNTER — Other Ambulatory Visit: Payer: Self-pay | Admitting: Physician Assistant

## 2021-07-22 DIAGNOSIS — E119 Type 2 diabetes mellitus without complications: Secondary | ICD-10-CM

## 2021-07-27 ENCOUNTER — Other Ambulatory Visit: Payer: Self-pay | Admitting: Physician Assistant

## 2021-07-27 DIAGNOSIS — I152 Hypertension secondary to endocrine disorders: Secondary | ICD-10-CM

## 2021-07-27 DIAGNOSIS — E1159 Type 2 diabetes mellitus with other circulatory complications: Secondary | ICD-10-CM

## 2021-08-03 ENCOUNTER — Other Ambulatory Visit: Payer: Self-pay | Admitting: Physician Assistant

## 2021-08-03 DIAGNOSIS — F39 Unspecified mood [affective] disorder: Secondary | ICD-10-CM

## 2021-08-03 DIAGNOSIS — E1159 Type 2 diabetes mellitus with other circulatory complications: Secondary | ICD-10-CM

## 2021-08-03 DIAGNOSIS — E1169 Type 2 diabetes mellitus with other specified complication: Secondary | ICD-10-CM

## 2021-08-08 ENCOUNTER — Other Ambulatory Visit: Payer: Self-pay

## 2021-08-08 DIAGNOSIS — I152 Hypertension secondary to endocrine disorders: Secondary | ICD-10-CM

## 2021-08-08 DIAGNOSIS — E118 Type 2 diabetes mellitus with unspecified complications: Secondary | ICD-10-CM

## 2021-08-08 MED ORDER — METFORMIN HCL ER 500 MG PO TB24: ORAL_TABLET | ORAL | 1 refills | Status: DC

## 2021-08-08 MED ORDER — LOSARTAN POTASSIUM 100 MG PO TABS: 100.0000 mg | ORAL_TABLET | Freq: Every day | ORAL | 1 refills | Status: DC

## 2021-08-26 ENCOUNTER — Other Ambulatory Visit: Payer: Self-pay | Admitting: Physician Assistant

## 2021-08-26 DIAGNOSIS — Z1231 Encounter for screening mammogram for malignant neoplasm of breast: Secondary | ICD-10-CM

## 2021-09-07 ENCOUNTER — Ambulatory Visit
Admission: RE | Admit: 2021-09-07 | Discharge: 2021-09-07 | Disposition: A | Discharge status: Home / Self Care | Payer: Medicare Other | Source: Ambulatory Visit | Attending: Physician Assistant | Admitting: Physician Assistant

## 2021-09-07 DIAGNOSIS — Z1231 Encounter for screening mammogram for malignant neoplasm of breast: Secondary | ICD-10-CM

## 2021-09-29 ENCOUNTER — Other Ambulatory Visit: Payer: Self-pay | Admitting: Physician Assistant

## 2021-09-29 DIAGNOSIS — E1169 Type 2 diabetes mellitus with other specified complication: Secondary | ICD-10-CM

## 2021-09-29 DIAGNOSIS — E1159 Type 2 diabetes mellitus with other circulatory complications: Secondary | ICD-10-CM

## 2021-10-05 ENCOUNTER — Other Ambulatory Visit: Payer: Self-pay | Admitting: Physician Assistant

## 2021-10-05 DIAGNOSIS — F39 Unspecified mood [affective] disorder: Secondary | ICD-10-CM

## 2021-10-06 ENCOUNTER — Encounter: Payer: Self-pay | Admitting: Physician Assistant

## 2021-10-06 ENCOUNTER — Ambulatory Visit (INDEPENDENT_AMBULATORY_CARE_PROVIDER_SITE_OTHER): Payer: Medicare Other | Admitting: Physician Assistant

## 2021-10-06 VITALS — BP 117/71 | HR 67 | Temp 97.7°F | Ht 62.0 in | Wt 150.0 lb

## 2021-10-06 DIAGNOSIS — M25532 Pain in left wrist: Secondary | ICD-10-CM | POA: Diagnosis not present

## 2021-10-06 NOTE — Patient Instructions (Signed)
Wrist Pain, Adult There are many things that can cause wrist pain. Some common causes include: An injury to the wrist. Using the joint too much. A condition that causes too much pressure to be put on a nerve in the wrist (carpal tunnel syndrome). Wear and tear of the joints that happens as a person gets older (osteoarthritis). A condition that causes swelling and stiffness in the joints (arthritis). Sometimes, the cause of wrist pain is not known. Often, the pain goes away when you follow your doctor's instructions for easing pain at home. This may include resting your wrist, icing your wrist, or using a splint or an elastic wrap for a short time. It is important to tell your doctor if your wrist pain does not go away. Follow these instructions at home: If you have a splint or elastic wrap: Wear the splint or wrap as told by your doctor. Take it off only as told by your doctor. Ask if you can take it off for bathing. Loosen the splint or wrap if your fingers: Tingle. Become numb. Turn cold and blue. Check the skin around the splint or wrap every day. Tell your doctor about any concerns. Keep the splint or wrap clean. If the splint or wrap is not waterproof: Do not let it get wet. Cover it with a watertight covering when you take a bath or shower. Managing pain, stiffness, and swelling  If told, put ice on the painful area. To do this: If you have a removable splint or wrap, take it off as told by your doctor. Put ice in a plastic bag. Place a towel between your skin and the bag or between your splint or wrap and the bag. Leave the ice on for 20 minutes, 2-3 times a day. Move your fingers often. Raise (elevate) the injured area above the level of your heart while you are sitting or lying down. Activity Rest your wrist as told by your doctor. Return to your normal activities as told by your doctor. Ask your doctor what activities are safe for you. Ask your doctor when it is safe to  drive if you have a splint or wrap on your wrist. Do exercises as told by your doctor. General instructions Pay attention to any changes in your symptoms. Take over-the-counter and prescription medicines only as told by your doctor. Keep all follow-up visits as told by your doctor. This is important. Contact a doctor if: You have a sudden, sharp pain in the wrist, hand, or arm that is different or new. The swelling or bruising on your wrist or hand gets worse. Your skin: Becomes red. Gets a rash. Has open sores. Your pain does not get better. Your pain gets worse. You have a fever or chills. Get help right away if: You lose feeling in your fingers or hand. Your fingers turn white, very red, or cold and blue. You cannot move your fingers. Summary There are many things that can cause wrist pain. It is important to tell your doctor if your wrist pain does not go away. You may need to wear a splint or a wrap for a short period of time. Return to your normal activities as told by your doctor. Ask your doctor what activities are safe for you. This information is not intended to replace advice given to you by your health care provider. Make sure you discuss any questions you have with your health care provider. Document Revised: 03/27/2019 Document Reviewed: 03/27/2019 Elsevier Patient Education  2023   [employer].

## 2021-10-06 NOTE — Progress Notes (Signed)
Established patient acute visit   Patient: Rachel Camacho   DOB: 1945-11-25   76 y.o. Female  MRN: [id number] Visit Date: 10/06/2021  Chief Complaint  Patient presents with   Wrist Pain   Subjective    HPI  Patient presents for chronic left wrist pain. States pain is getting worse. Denies stiffness, joint swelling, numbness or tingling, recent fall or injury. Pain comes and goes. Flexing makes the pain worse. States her wrist is not hurting her today.     Medications: Outpatient Medications Prior to Visit  Medication Sig   amLODipine (NORVASC) 5 MG tablet TAKE 1 TABLET BY MOUTH DAILY   atorvastatin (LIPITOR) 20 MG tablet TAKE 1 TABLET BY MOUTH AT  BEDTIME   Blood Glucose Monitoring Suppl (ONETOUCH VERIO) w/Device KIT Use to test glucose fating in AM and 2 hours after largest meal   Calcium Carbonate (CALCIUM 500 PO) Take by mouth.   Cholecalciferol (VITAMIN D-3) 5000 units TABS Take 1 tablet by mouth daily.   diazepam (VALIUM) 5 MG tablet Take 1 tablet (5 mg total) by mouth every 12 (twelve) hours as needed for anxiety.   diclofenac Sodium (VOLTAREN) 1 % GEL Please specify directions, refills and quantity   ibuprofen (ADVIL) 600 MG tablet Take 1 tablet (600 mg total) by mouth every 8 (eight) hours as needed.   Lancets (ONETOUCH DELICA PLUS [id number]) MISC USE 2 LANCETS DAILY AS  DIRECTED   losartan (COZAAR) 100 MG tablet Take 1 tablet (100 mg total) by mouth daily.   metFORMIN (GLUCOPHAGE-XR) 500 MG 24 hr tablet TAKE 2 TABLETS BY MOUTH  DAILY AFTER SUPPER   ONETOUCH VERIO test strip USE TO CHECK FASTING GLUCOSE IN THE AM AND TO CHECK 2 HOURS AFTER LARGEST MEAL OF THE DAY.   triamcinolone cream (KENALOG) 0.1 % Apply 1 application topically 2 (two) times daily.   [DISCONTINUED] citalopram (CELEXA) 20 MG tablet TAKE 1 TABLET BY MOUTH DAILY   No facility-administered medications prior to visit.    Review of Systems Review of Systems:  A fourteen system review of systems was  performed and found to be positive as per HPI.     Objective    BP 117/71   Pulse 67   Temp 97.7 F (36.5 C)   Ht $R'5\' 2"'ki$  (1.575 m)   Wt 150 lb (68 kg)   SpO2 100%   BMI 27.44 kg/m    Physical Exam  General:  Well Developed, well nourished, appropriate for stated age.  Neuro:  Alert and oriented,  extra-ocular muscles intact  HEENT:  Normocephalic, atraumatic, neck supple  Skin:  no gross rash, warm, pink. Cardiac:  RRR, S1 S2 Respiratory: CTA B/L  MSK: Good ROM of both wrist, no tenderness on exam, negative Finkelstein's test, negative Tinel's sign, small superficial nodule noted over volar wrist  Vascular:  Ext warm, no cyanosis apprec.; cap RF less 2 sec. Psych:  No HI/SI, judgement and insight good, Euthymic mood. Full Affect.   No results found for any visits on 10/06/21.  Assessment & Plan     Patient presenting with chronic intermittent left wrist pain and currently asymptomatic. Discussed with patient obtaining wrist x-ray and/or labs to evaluate for inflammatory arthropathy. Patient deferred. Discussed referral to orthopedics/hand specialist. Patient is agreeable. Provided sample of Voltaren gel to use when needed for pain relief.     Return if symptoms worsen or fail to improve.        Lorrene Reid, PA-C  [facility]  at [facility] [phone] (phone) [phone] (fax)  [place]

## 2021-10-16 ENCOUNTER — Other Ambulatory Visit: Payer: Self-pay | Admitting: Physician Assistant

## 2021-10-16 DIAGNOSIS — E1159 Type 2 diabetes mellitus with other circulatory complications: Secondary | ICD-10-CM

## 2021-10-25 ENCOUNTER — Ambulatory Visit: Payer: Medicare Other | Admitting: Orthopedic Surgery

## 2021-10-28 ENCOUNTER — Ambulatory Visit (INDEPENDENT_AMBULATORY_CARE_PROVIDER_SITE_OTHER): Payer: Medicare Other

## 2021-10-28 ENCOUNTER — Ambulatory Visit (INDEPENDENT_AMBULATORY_CARE_PROVIDER_SITE_OTHER): Payer: Medicare Other | Admitting: Orthopaedic Surgery

## 2021-10-28 DIAGNOSIS — M25561 Pain in right knee: Secondary | ICD-10-CM | POA: Diagnosis not present

## 2021-10-28 DIAGNOSIS — M25532 Pain in left wrist: Secondary | ICD-10-CM | POA: Diagnosis not present

## 2021-10-28 NOTE — Progress Notes (Signed)
Office Visit Note   Patient: Rachel Camacho           Date of Birth: 1946/01/31           MRN: [id number] Visit Date: 10/28/2021              Requested by: Lorrene Reid, PA-C [place],  [place] [address] PCP: Lorrene Reid, PA-C   Assessment & Plan: Visit Diagnoses:  1. Right knee pain, unspecified chronicity   2. Pain in left wrist     Plan: Impression is right knee pain and left wrist pain.  For the knee sounds like she has tendinitis of the biceps femoris.  I recommend topical NSAIDs and over-the-counter NSAIDs and relative rest and activity modification.  For the left wrist I feel that this is osteoarthritis issue therefore she should also continue to use topical NSAIDs and over-the-counter NSAIDs.  Follow-up as needed.  Follow-Up Instructions: No follow-ups on file.   Orders:  Orders Placed This Encounter  Procedures   XR Wrist Complete Left   XR KNEE 3 VIEW RIGHT   No orders of the defined types were placed in this encounter.     Procedures: No procedures performed   Clinical Data: No additional findings.   Subjective: Chief Complaint  Patient presents with   Left Wrist - Pain   Right Knee - Pain    HPI Patient is a very pleasant 76 year old female here for left wrist and right knee pain.  Has had left wrist pain for 6 months without injury and right knee pain for about a year.  Has used topical Voltaren gel as well.  Has pain in the knee when walking.  Has not done any physical therapy.  Pain in the knee is localized over the biceps femoris tendon.  Pain in the wrist is circumferential.  Review of Systems  Constitutional: Negative.   HENT: Negative.    Eyes: Negative.   Respiratory: Negative.    Cardiovascular: Negative.   Endocrine: Negative.   Musculoskeletal: Negative.   Neurological: Negative.   Hematological: Negative.   Psychiatric/Behavioral: Negative.    All other systems reviewed and are  negative.    Objective: Vital Signs: There were no vitals taken for this visit.  Physical Exam Vitals and nursing note reviewed.  Constitutional:      Appearance: She is well-developed.  HENT:     Head: Normocephalic and atraumatic.  Pulmonary:     Effort: Pulmonary effort is normal.  Abdominal:     Palpations: Abdomen is soft.  Musculoskeletal:     Cervical back: Neck supple.  Skin:    General: Skin is warm.     Capillary Refill: Capillary refill takes less than 2 seconds.  Neurological:     Mental Status: She is alert and oriented to person, place, and time.  Psychiatric:        Behavior: Behavior normal.        Thought Content: Thought content normal.        Judgment: Judgment normal.     Ortho Exam Examination of the right knee shows no joint effusion.  Normal range of motion.  No joint line tenderness.  Collaterals and cruciates are stable.  She endorses tenderness along the biceps femoris tendon.  Strength and sensation are normal.  Examination of the left wrist shows slight swelling of the volar aspect of the FCR tendon possibly a ganglion cyst.  There is no warmth or swelling or skin lesions  otherwise.  She has full range of motion of the wrist.  Snuffbox is nontender.  Negative grind test. Specialty Comments:  No specialty comments available.  Imaging: XR Wrist Complete Left  Result Date: 10/28/2021 Mild osteoarthritis.  No acute or structural abnormalities.  XR KNEE 3 VIEW RIGHT  Result Date: 10/28/2021 Mild osteoarthritis with mild joint space narrowing.    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 Intial UPC 4.7 grams. Conservative Rx. 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 Lorrene Reid)    No family history on file.  No past surgical history on file. Social History   Occupational History   Not on file  Tobacco Use   Smoking status: Never   Smokeless tobacco: Never  Vaping Use   Vaping Use: Never used  Substance and Sexual Activity   Alcohol use: Yes    Comment: social   Drug use: No   Sexual activity: Never    Birth control/protection: None

## 2021-10-31 ENCOUNTER — Other Ambulatory Visit: Payer: Self-pay | Admitting: Physician Assistant

## 2021-10-31 DIAGNOSIS — E118 Type 2 diabetes mellitus with unspecified complications: Secondary | ICD-10-CM

## 2021-11-03 ENCOUNTER — Other Ambulatory Visit: Payer: Self-pay | Admitting: Physician Assistant

## 2021-11-03 DIAGNOSIS — E118 Type 2 diabetes mellitus with unspecified complications: Secondary | ICD-10-CM

## 2021-11-11 ENCOUNTER — Other Ambulatory Visit: Payer: Self-pay | Admitting: Physician Assistant

## 2021-11-11 DIAGNOSIS — I152 Hypertension secondary to endocrine disorders: Secondary | ICD-10-CM

## 2021-11-11 DIAGNOSIS — E1169 Type 2 diabetes mellitus with other specified complication: Secondary | ICD-10-CM

## 2022-01-06 ENCOUNTER — Telehealth: Payer: Self-pay | Admitting: Physician Assistant

## 2022-01-06 DIAGNOSIS — E119 Type 2 diabetes mellitus without complications: Secondary | ICD-10-CM

## 2022-01-06 MED ORDER — ONETOUCH VERIO VI STRP: ORAL_STRIP | 12 refills | Status: DC

## 2022-01-06 NOTE — Telephone Encounter (Signed)
Patient requesting refill of test strips. Please advise.

## 2022-01-06 NOTE — Telephone Encounter (Signed)
Refill sent to pharmacy. AS, CMA 

## 2022-01-20 ENCOUNTER — Other Ambulatory Visit: Payer: Self-pay | Admitting: Physician Assistant

## 2022-01-20 DIAGNOSIS — I152 Hypertension secondary to endocrine disorders: Secondary | ICD-10-CM

## 2022-01-20 DIAGNOSIS — E1169 Type 2 diabetes mellitus with other specified complication: Secondary | ICD-10-CM

## 2022-01-30 ENCOUNTER — Other Ambulatory Visit: Payer: Self-pay | Admitting: Physician Assistant

## 2022-01-30 DIAGNOSIS — E119 Type 2 diabetes mellitus without complications: Secondary | ICD-10-CM

## 2022-02-05 ENCOUNTER — Other Ambulatory Visit: Payer: Self-pay | Admitting: Physician Assistant

## 2022-02-05 DIAGNOSIS — I152 Hypertension secondary to endocrine disorders: Secondary | ICD-10-CM

## 2022-02-13 ENCOUNTER — Other Ambulatory Visit: Payer: Self-pay | Admitting: Physician Assistant

## 2022-02-13 DIAGNOSIS — F410 Panic disorder [episodic paroxysmal anxiety] without agoraphobia: Secondary | ICD-10-CM

## 2022-02-21 ENCOUNTER — Other Ambulatory Visit: Payer: Self-pay | Admitting: Physician Assistant

## 2022-02-21 DIAGNOSIS — E118 Type 2 diabetes mellitus with unspecified complications: Secondary | ICD-10-CM

## 2022-02-21 MED ORDER — METFORMIN HCL ER 500 MG PO TB24: ORAL_TABLET | ORAL | 0 refills | Status: DC

## 2022-02-22 ENCOUNTER — Telehealth: Payer: Self-pay

## 2022-02-22 NOTE — Telephone Encounter (Signed)
Patient called regarding refill on her metformin,please advise, thanks!

## 2022-02-22 NOTE — Telephone Encounter (Signed)
A 1 month supply was sent in, patient needs to schedule an appointment

## 2022-02-28 ENCOUNTER — Ambulatory Visit (INDEPENDENT_AMBULATORY_CARE_PROVIDER_SITE_OTHER): Payer: Medicare Other | Admitting: Physician Assistant

## 2022-02-28 ENCOUNTER — Encounter: Payer: Self-pay | Admitting: Physician Assistant

## 2022-02-28 VITALS — BP 116/71 | HR 66 | Temp 98.0°F | Ht 63.0 in | Wt 153.0 lb

## 2022-02-28 DIAGNOSIS — E1169 Type 2 diabetes mellitus with other specified complication: Secondary | ICD-10-CM

## 2022-02-28 DIAGNOSIS — E118 Type 2 diabetes mellitus with unspecified complications: Secondary | ICD-10-CM

## 2022-02-28 DIAGNOSIS — E1129 Type 2 diabetes mellitus with other diabetic kidney complication: Secondary | ICD-10-CM | POA: Diagnosis not present

## 2022-02-28 DIAGNOSIS — E782 Mixed hyperlipidemia: Secondary | ICD-10-CM

## 2022-02-28 DIAGNOSIS — E559 Vitamin D deficiency, unspecified: Secondary | ICD-10-CM | POA: Diagnosis not present

## 2022-02-28 DIAGNOSIS — F39 Unspecified mood [affective] disorder: Secondary | ICD-10-CM

## 2022-02-28 DIAGNOSIS — F410 Panic disorder [episodic paroxysmal anxiety] without agoraphobia: Secondary | ICD-10-CM

## 2022-02-28 DIAGNOSIS — E1159 Type 2 diabetes mellitus with other circulatory complications: Secondary | ICD-10-CM | POA: Diagnosis not present

## 2022-02-28 DIAGNOSIS — I152 Hypertension secondary to endocrine disorders: Secondary | ICD-10-CM | POA: Diagnosis not present

## 2022-02-28 DIAGNOSIS — R809 Proteinuria, unspecified: Secondary | ICD-10-CM

## 2022-02-28 DIAGNOSIS — F419 Anxiety disorder, unspecified: Secondary | ICD-10-CM

## 2022-02-28 LAB — POCT GLYCOSYLATED HEMOGLOBIN (HGB A1C): Hemoglobin A1C: 6.6 % — AB (ref 4.0–5.6)

## 2022-02-28 MED ORDER — METFORMIN HCL ER 500 MG PO TB24: ORAL_TABLET | ORAL | 1 refills | Status: DC

## 2022-02-28 MED ORDER — ATORVASTATIN CALCIUM 20 MG PO TABS: 20.0000 mg | ORAL_TABLET | Freq: Every day | ORAL | 1 refills | Status: DC

## 2022-02-28 MED ORDER — AMLODIPINE BESYLATE 5 MG PO TABS: 5.0000 mg | ORAL_TABLET | Freq: Every day | ORAL | 1 refills | Status: DC

## 2022-02-28 MED ORDER — DIAZEPAM 5 MG PO TABS: 5.0000 mg | ORAL_TABLET | Freq: Two times a day (BID) | ORAL | 0 refills | Status: DC | PRN

## 2022-02-28 NOTE — Patient Instructions (Signed)

## 2022-02-28 NOTE — Assessment & Plan Note (Signed)
-  A1c 6.6, remains at goal <7.0. Will continue Metformin XR 500 mg 2 tablets daily. Continue ambulatory glucose monitoring and recommend to reduce sugar intake. Discussed stress reduction.

## 2022-02-28 NOTE — Assessment & Plan Note (Signed)
-  Controlled. Continue amlodipine 5 mg and losartan 100 mg daily. Will collect CMP for medication monitoring with lab visit. Recommend to follow a low salt diet.

## 2022-02-28 NOTE — Assessment & Plan Note (Signed)
>>  ASSESSMENT AND PLAN FOR DIABETES MELLITUS WITH COMPLICATION (HCC) WRITTEN ON 02/28/2022  5:08 PM BY ABONZA, MARITZA, PA-C  -A1c 6.6, remains at goal <7.0. Will continue Metformin XR 500 mg 2 tablets daily. Continue ambulatory glucose monitoring and recommend to reduce sugar intake. Discussed stress reduction.

## 2022-02-28 NOTE — Progress Notes (Signed)
Established patient visit   Patient: Rachel Camacho   DOB: 20-Jun-1945   76 y.o. Female  MRN: [id number] Visit Date: 02/28/2022  Chief Complaint  Patient presents with   Follow-up    Medication refill   Subjective    HPI HPI     Follow-up    Additional comments: Medication refill      Last edited by Adelfa Koh, CMA on 02/28/2022  2:13 PM.      Patient presents for chronic follow-up visit.  Diabetes mellitus: Pt denies increased urination or thirst. Pt reports medication compliance. No hypoglycemic events. Checking glucose at home. FBS range from 110s-130s. Does have a few readings in the 150s or 160s. Patient reports personal stress so eating more cookies than normal.  HTN: Pt denies chest pain, palpitations, dizziness or lower extremity swelling. Taking medication as directed without side effects . Pt tries to follow a low salt diet.  HLD: Pt taking medication as directed without issues.   Mood: Patient reports feeling stressed with house renovations and dental work so has been more anxious. Denies feeling depressed. Reports sleeping well. Taking citalopram 20 mg as directed, pt has been on medication for many years.     02/28/2022    2:15 PM 10/06/2021   11:09 AM 07/12/2021    1:52 PM 05/18/2021    3:43 PM 01/11/2021    2:33 PM  Depression screen PHQ 2/9  Decreased Interest 0 0 1 0 0  Down, Depressed, Hopeless 1 1 0 0 0  PHQ - 2 Score '1 1 1 ' 0 0  Altered sleeping 0 0 0 0 0  Tired, decreased energy '1 1 1 1 ' 0  Change in appetite 0 0 1 1 0  Feeling bad or failure about yourself  0 0 0 0 0  Trouble concentrating 1 0 1 0 0  Moving slowly or fidgety/restless 0 0 0 0 0  Suicidal thoughts 0 0 0 0 0  PHQ-9 Score '3 2 4 2 ' 0  Difficult doing work/chores Somewhat difficult Somewhat difficult Somewhat difficult Somewhat difficult       02/28/2022    2:15 PM 10/06/2021   11:09 AM 07/12/2021    1:52 PM 05/18/2021    3:43 PM  GAD 7 : Generalized Anxiety Score  Nervous,  Anxious, on Edge '1 1 1 ' 0  Control/stop worrying 1 0 0 0  Worry too much - different things 0 0 0 0  Trouble relaxing 0 1 1 0  Restless 0 0 0 0  Easily annoyed or irritable 0 0 0 0  Afraid - awful might happen 0 0 0 0  Total GAD 7 Score '2 2 2 ' 0  Anxiety Difficulty Not difficult at all Somewhat difficult  Not difficult at all      Medications: Outpatient Medications Prior to Visit  Medication Sig   Blood Glucose Monitoring Suppl (ONETOUCH VERIO) w/Device KIT Use to test glucose fating in AM and 2 hours after largest meal   Calcium Carbonate (CALCIUM 500 PO) Take by mouth. (Patient not taking: Reported on 02/28/2022)   Cholecalciferol (VITAMIN D-3) 5000 units TABS Take 1 tablet by mouth daily.   citalopram (CELEXA) 20 MG tablet TAKE 1 TABLET BY MOUTH DAILY   diclofenac Sodium (VOLTAREN) 1 % GEL Please specify directions, refills and quantity (Patient not taking: Reported on 02/28/2022)   glucose blood (ONETOUCH VERIO) test strip USE TO CHECK FASTING GLUCOSE IN THE AM AND TO CHECK 2 HOURS AFTER LARGEST MEAL OF THE  DAY.   ibuprofen (ADVIL) 600 MG tablet Take 1 tablet (600 mg total) by mouth every 8 (eight) hours as needed.   Lancets (ONETOUCH DELICA PLUS [id number]) MISC USE 2 LANCETS DAILY AS DIRECTED   losartan (COZAAR) 100 MG tablet TAKE 1 TABLET BY MOUTH DAILY   triamcinolone cream (KENALOG) 0.1 % Apply 1 application topically 2 (two) times daily. (Patient not taking: Reported on 02/28/2022)   [DISCONTINUED] amLODipine (NORVASC) 5 MG tablet TAKE 1 TABLET BY MOUTH DAILY   [DISCONTINUED] atorvastatin (LIPITOR) 20 MG tablet TAKE 1 TABLET BY MOUTH AT  BEDTIME   [DISCONTINUED] diazepam (VALIUM) 5 MG tablet TAKE 1 TABLET BY MOUTH EVERY 12 HOURS AS NEEDED FOR ANXIETY.   [DISCONTINUED] metFORMIN (GLUCOPHAGE-XR) 500 MG 24 hr tablet TAKE 2 TABLETS (1,000 MG TOTAL) BY MOUTH DAILY AFTER SUPPER.   No facility-administered medications prior to visit.    Review of Systems Review of Systems:  A  fourteen system review of systems was performed and found to be positive as per HPI.  Last CBC Lab Results  Component Value Date   WBC 6.6 05/18/2021   HGB 13.7 05/18/2021   HCT 40.6 05/18/2021   MCV 86 05/18/2021   MCH 29.1 05/18/2021   RDW 12.9 05/18/2021   PLT 288 94/80/1655   Last metabolic panel Lab Results  Component Value Date   GLUCOSE 97 05/18/2021   NA 140 05/18/2021   K 4.2 05/18/2021   CL 101 05/18/2021   CO2 24 05/18/2021   BUN 16 05/18/2021   CREATININE 0.81 05/18/2021   EGFR 75 05/18/2021   CALCIUM 9.7 05/18/2021   PHOS 4.3 07/12/2017   PROT 7.3 05/18/2021   ALBUMIN 5.0 (H) 05/18/2021   LABGLOB 2.3 05/18/2021   AGRATIO 2.2 05/18/2021   BILITOT 0.8 05/18/2021   ALKPHOS 81 05/18/2021   AST 18 05/18/2021   ALT 14 05/18/2021   Last lipids Lab Results  Component Value Date   CHOL 122 02/10/2020   HDL 58 02/10/2020   LDLCALC 50 02/10/2020   TRIG 64 02/10/2020   CHOLHDL 2.1 02/10/2020   Last hemoglobin A1c Lab Results  Component Value Date   HGBA1C 6.6 (A) 02/28/2022   Last thyroid functions Lab Results  Component Value Date   TSH 1.490 05/18/2021       Objective    BP 116/71   Pulse 66   Temp 98 F (36.7 C) (Temporal)   Ht '5\' 3"'  (1.6 m)   Wt 153 lb (69.4 kg)   SpO2 98%   BMI 27.10 kg/m  BP Readings from Last 3 Encounters:  02/28/22 116/71  10/06/21 117/71  07/12/21 111/71   Wt Readings from Last 3 Encounters:  02/28/22 153 lb (69.4 kg)  10/06/21 150 lb (68 kg)  07/12/21 148 lb (67.1 kg)    Physical Exam  General:  Well Developed, well nourished, appropriate for stated age.  Neuro:  Alert and oriented,  extra-ocular muscles intact  HEENT:  Normocephalic, atraumatic, neck supple  Skin:  no gross rash, warm, pink. Cardiac:  RRR, S1 S2 Respiratory: CTA B/L  Vascular:  Ext warm, no cyanosis apprec.; cap RF less 2 sec. Psych:  No HI/SI, judgement and insight good, Euthymic mood. Full Affect.   Results for orders placed or  performed in visit on 02/28/22  POCT HgB A1C  Result Value Ref Range   Hemoglobin A1C 6.6 (A) 4.0 - 5.6 %   HbA1c POC (<> result, manual entry)     HbA1c, POC (prediabetic  range)     HbA1c, POC (controlled diabetic range)      Assessment & Plan      Problem List Items Addressed This Visit       Cardiovascular and Mediastinum   Hypertension associated with diabetes ([place]) - Primary (Chronic)    -Controlled. Continue amlodipine 5 mg and losartan 100 mg daily. Will collect CMP for medication monitoring with lab visit. Recommend to follow a low salt diet.      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 Will collect urine microalbumin with lab visit.       Relevant Medications   metFORMIN (GLUCOPHAGE-XR) 500 MG 24 hr tablet   atorvastatin (LIPITOR) 20 MG tablet   Other Relevant Orders   Urine Microalbumin w/creat. ratio   Mixed diabetic hyperlipidemia associated with type 2 diabetes mellitus ([place]) (Chronic)    -Advised patient due for repeat fasting lipid panel, advised to schedule lab visit for routine fasting blood-work. Recommend to continue atorvastatin 20 mg at bedtime. Will collect hepatic function with lab visit as well. Discussed heart healthy diet.      Relevant Medications   metFORMIN (GLUCOPHAGE-XR) 500 MG 24 hr tablet   amLODipine (NORVASC) 5 MG tablet   atorvastatin (LIPITOR) 20 MG tablet   Other Relevant Orders   POCT HgB A1C (Completed)   Lipid panel   CBC with Differential/Platelet   Comprehensive metabolic panel   Diabetes mellitus with complication (HCC)    -Z6X 6.6, remains at goal <7.0. Will continue Metformin XR 500 mg 2 tablets daily. Continue ambulatory glucose  monitoring and recommend to reduce sugar intake. Discussed stress reduction.       Relevant Medications   metFORMIN (GLUCOPHAGE-XR) 500 MG 24 hr tablet   atorvastatin (LIPITOR) 20 MG tablet   Other Relevant Orders   POCT HgB A1C (Completed)   CBC with Differential/Platelet   Comprehensive metabolic panel     Other   Mood disorder (HCC) (Chronic)    -PHQ-9 score of 3 and GAD-7 score 2. Discussed with patient due to increased anxiety recommend treatment adjustments. Pt prefers to trial magnesium supplement to see if it helps with anxiety during the day. Advised if anxiety fails to improve with supplement then recommend changing Citalopram to Lexapro or Duloxetine. Discussed with patient will resend rx for diazepam 5 mg to take before dental procedures, PDMP reviewed and did not fill rx sent last month. Discussed adverse effects with benzodiazapine therapy especially with long-term use and do not recommend taking diazepam daily or weekly for anxiety, recommend changing SSRI to help improve anxiety. Pt verbalized understanding. If patient will continue to need diazepam for future dental procedures then recommend obtaining a controlled substance contract.      Vitamin D deficiency (Chronic)   Relevant Orders   Vitamin D (25 hydroxy)   Other Visit Diagnoses     Panic attack       Relevant Medications   diazepam (VALIUM) 5 MG tablet   Anxiety       Relevant Medications   diazepam (VALIUM) 5 MG tablet   Other Relevant Orders   TSH       Return in about 4 months (around  07/01/2022) for DM, HTN, HLD; lab visit 1-2 weeks for FBW and urine microalbumin.        Lorrene Reid, PA-C  [facility] at [facility] [phone] (phone) [phone] (fax)  [place]

## 2022-02-28 NOTE — Assessment & Plan Note (Signed)
-  On ARB therapy. Will collect urine microalbumin with lab visit.

## 2022-02-28 NOTE — Assessment & Plan Note (Signed)
-  Advised patient due for repeat fasting lipid panel, advised to schedule lab visit for routine fasting blood-work. Recommend to continue atorvastatin 20 mg at bedtime. Will collect hepatic function with lab visit as well. Discussed heart healthy diet.

## 2022-02-28 NOTE — Assessment & Plan Note (Signed)
-  PHQ-9 score of 3 and GAD-7 score 2. Discussed with patient due to increased anxiety recommend treatment adjustments. Pt prefers to trial magnesium supplement to see if it helps with anxiety during the day. Advised if anxiety fails to improve with supplement then recommend changing Citalopram to Lexapro or Duloxetine. Discussed with patient will resend rx for diazepam 5 mg to take before dental procedures, PDMP reviewed and did not fill rx sent last month. Discussed adverse effects with benzodiazapine therapy especially with long-term use and do not recommend taking diazepam daily or weekly for anxiety, recommend changing SSRI to help improve anxiety. Pt verbalized understanding. If patient will continue to need diazepam for future dental procedures then recommend obtaining a controlled substance contract.

## 2022-03-07 ENCOUNTER — Other Ambulatory Visit: Payer: Medicare Other

## 2022-03-07 DIAGNOSIS — F419 Anxiety disorder, unspecified: Secondary | ICD-10-CM

## 2022-03-07 DIAGNOSIS — E559 Vitamin D deficiency, unspecified: Secondary | ICD-10-CM

## 2022-03-07 DIAGNOSIS — E1159 Type 2 diabetes mellitus with other circulatory complications: Secondary | ICD-10-CM

## 2022-03-07 DIAGNOSIS — E118 Type 2 diabetes mellitus with unspecified complications: Secondary | ICD-10-CM

## 2022-03-07 DIAGNOSIS — E1129 Type 2 diabetes mellitus with other diabetic kidney complication: Secondary | ICD-10-CM

## 2022-03-07 DIAGNOSIS — E1169 Type 2 diabetes mellitus with other specified complication: Secondary | ICD-10-CM

## 2022-03-14 ENCOUNTER — Other Ambulatory Visit (INDEPENDENT_AMBULATORY_CARE_PROVIDER_SITE_OTHER): Payer: Medicare Other

## 2022-03-14 DIAGNOSIS — E1169 Type 2 diabetes mellitus with other specified complication: Secondary | ICD-10-CM

## 2022-03-14 DIAGNOSIS — E782 Mixed hyperlipidemia: Secondary | ICD-10-CM | POA: Diagnosis not present

## 2022-03-14 DIAGNOSIS — E1159 Type 2 diabetes mellitus with other circulatory complications: Secondary | ICD-10-CM | POA: Diagnosis not present

## 2022-03-14 DIAGNOSIS — E118 Type 2 diabetes mellitus with unspecified complications: Secondary | ICD-10-CM | POA: Diagnosis not present

## 2022-03-14 DIAGNOSIS — I152 Hypertension secondary to endocrine disorders: Secondary | ICD-10-CM

## 2022-03-14 DIAGNOSIS — E559 Vitamin D deficiency, unspecified: Secondary | ICD-10-CM | POA: Diagnosis not present

## 2022-03-14 LAB — POCT UA - MICROALBUMIN
Creatinine, POC: 10 mg/dL
Microalbumin Ur, POC: 30 mg/L

## 2022-03-14 NOTE — Addendum Note (Signed)
Addended by: Vivia Birmingham on: 03/14/2022 10:14 AM   Modules accepted: Orders

## 2022-03-15 LAB — COMPREHENSIVE METABOLIC PANEL
ALT: 17 IU/L (ref 0–32)
AST: 21 IU/L (ref 0–40)
Albumin/Globulin Ratio: 1.5 (ref 1.2–2.2)
Albumin: 4.4 g/dL (ref 3.8–4.8)
Alkaline Phosphatase: 80 IU/L (ref 44–121)
BUN/Creatinine Ratio: 18 (ref 12–28)
BUN: 16 mg/dL (ref 8–27)
Bilirubin Total: 0.6 mg/dL (ref 0.0–1.2)
CO2: 23 mmol/L (ref 20–29)
Calcium: 9.4 mg/dL (ref 8.7–10.3)
Chloride: 101 mmol/L (ref 96–106)
Creatinine, Ser: 0.88 mg/dL (ref 0.57–1.00)
Globulin, Total: 2.9 g/dL (ref 1.5–4.5)
Glucose: 115 mg/dL — ABNORMAL HIGH (ref 70–99)
Potassium: 4.4 mmol/L (ref 3.5–5.2)
Sodium: 140 mmol/L (ref 134–144)
Total Protein: 7.3 g/dL (ref 6.0–8.5)
eGFR: 68 mL/min/{1.73_m2} (ref >59)

## 2022-03-15 LAB — LIPID PANEL
Chol/HDL Ratio: 2.5 ratio (ref 0.0–4.4)
Cholesterol, Total: 171 mg/dL (ref 100–199)
HDL: 68 mg/dL (ref >39)
LDL Chol Calc (NIH): 88 mg/dL (ref 0–99)
Triglycerides: 79 mg/dL (ref 0–149)
VLDL Cholesterol Cal: 15 mg/dL (ref 5–40)

## 2022-03-15 LAB — CBC WITH DIFFERENTIAL/PLATELET
Basophils Absolute: 0.1 10*3/uL (ref 0.0–0.2)
Basos: 1 %
EOS (ABSOLUTE): 0.3 10*3/uL (ref 0.0–0.4)
Eos: 5 %
Hematocrit: 37.3 % (ref 34.0–46.6)
Hemoglobin: 12.4 g/dL (ref 11.1–15.9)
Immature Grans (Abs): 0 10*3/uL (ref 0.0–0.1)
Immature Granulocytes: 0 %
Lymphocytes Absolute: 2.1 10*3/uL (ref 0.7–3.1)
Lymphs: 34 %
MCH: 29.2 pg (ref 26.6–33.0)
MCHC: 33.2 g/dL (ref 31.5–35.7)
MCV: 88 fL (ref 79–97)
Monocytes Absolute: 0.4 10*3/uL (ref 0.1–0.9)
Monocytes: 6 %
Neutrophils Absolute: 3.4 10*3/uL (ref 1.4–7.0)
Neutrophils: 54 %
Platelets: 292 10*3/uL (ref 150–450)
RBC: 4.25 x10E6/uL (ref 3.77–5.28)
RDW: 13.3 % (ref 11.7–15.4)
WBC: 6.3 10*3/uL (ref 3.4–10.8)

## 2022-03-15 LAB — TSH: TSH: 1.18 u[IU]/mL (ref 0.450–4.500)

## 2022-03-15 LAB — VITAMIN D 25 HYDROXY (VIT D DEFICIENCY, FRACTURES): Vit D, 25-Hydroxy: 72.9 ng/mL (ref 30.0–100.0)

## 2022-03-16 ENCOUNTER — Other Ambulatory Visit: Payer: Self-pay | Admitting: Physician Assistant

## 2022-03-16 DIAGNOSIS — E118 Type 2 diabetes mellitus with unspecified complications: Secondary | ICD-10-CM

## 2022-04-10 IMAGING — MG DIGITAL SCREENING BILAT W/ TOMO W/ CAD
8 series · 8 of 24 positions shown · non-contrast
Comparison: Previous exam(s).

CLINICAL DATA: Screening.

EXAM:
DIGITAL SCREENING BILATERAL MAMMOGRAM WITH TOMO AND CAD

[R MLO synth-2D]
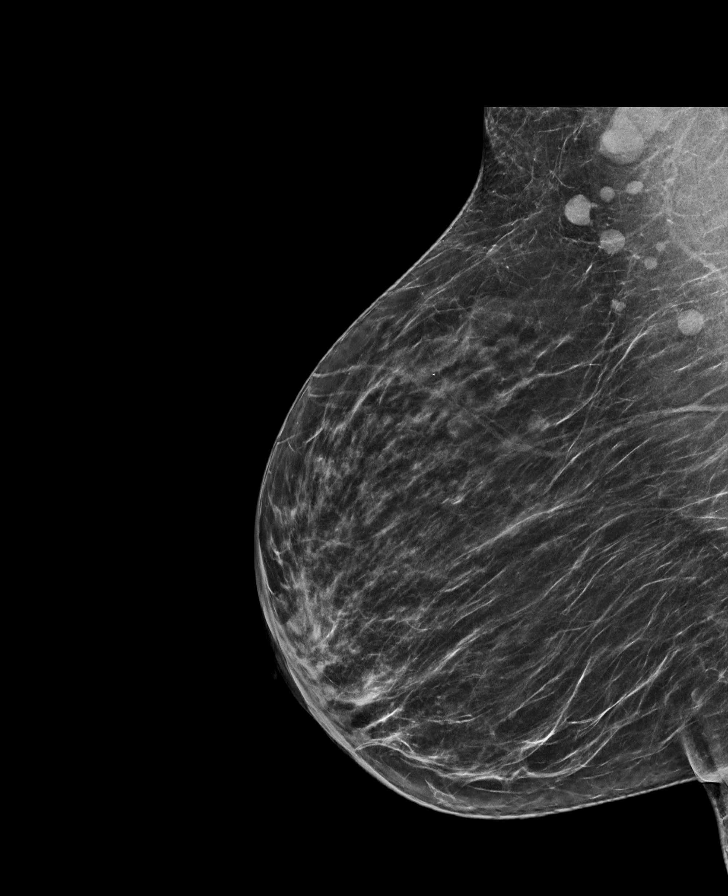

[L MLO synth-2D]
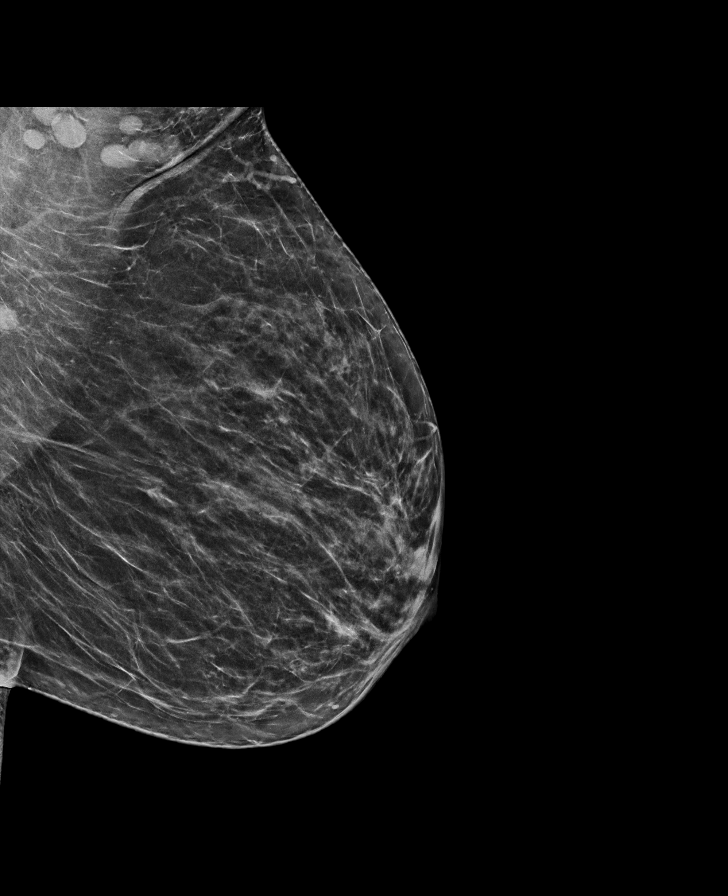

[L CC synth-2D]
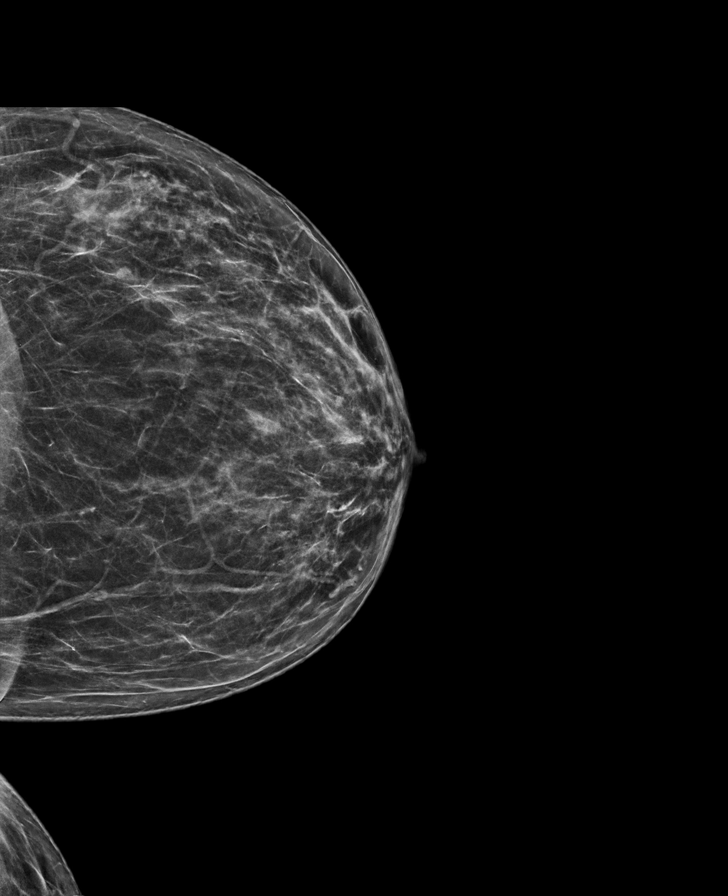

[R CC synth-2D]
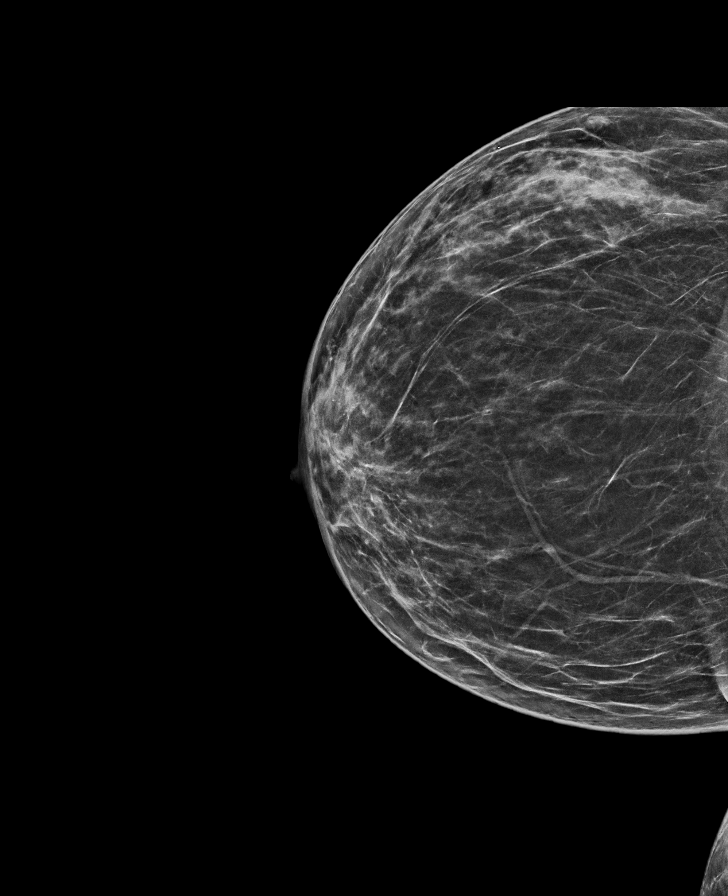

[L CC tomo · tomo slice 31/62.0]
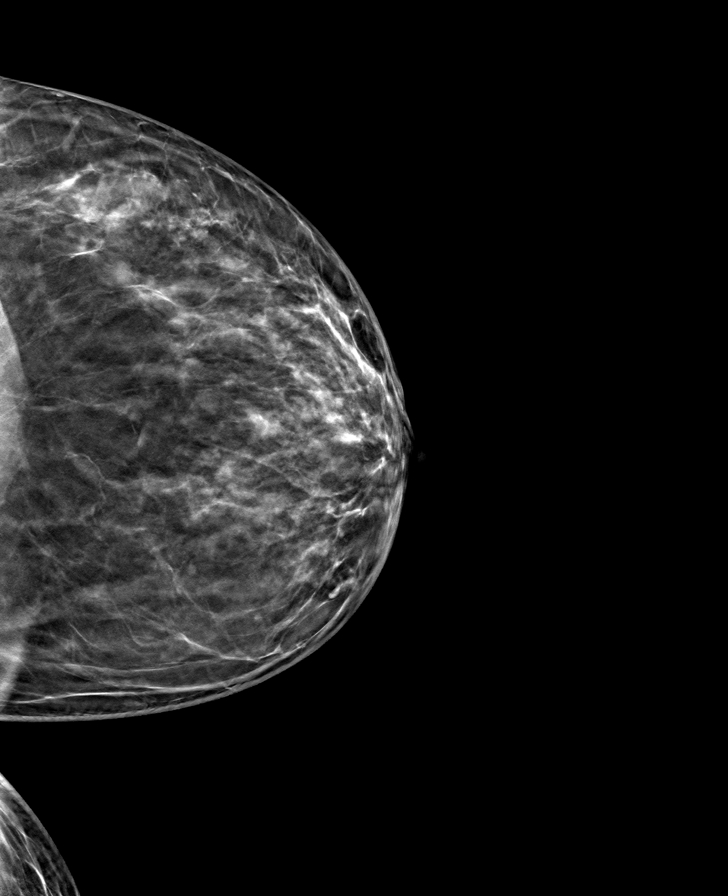

[R MLO tomo · tomo slice 31/61.0]
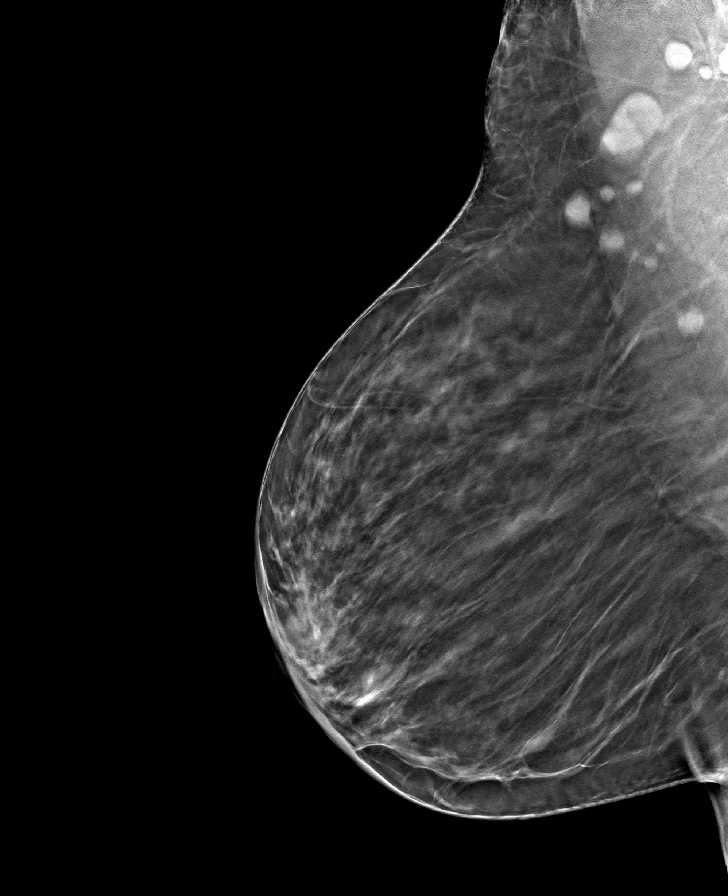

[R CC tomo · tomo slice 31/60.0]
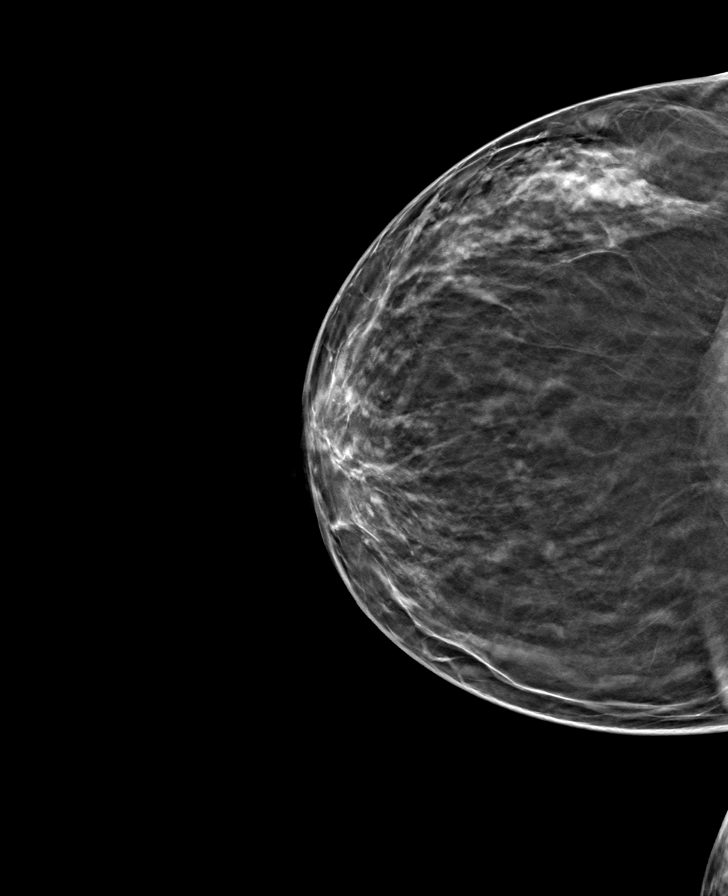

[L MLO tomo · tomo slice 31/62.0]
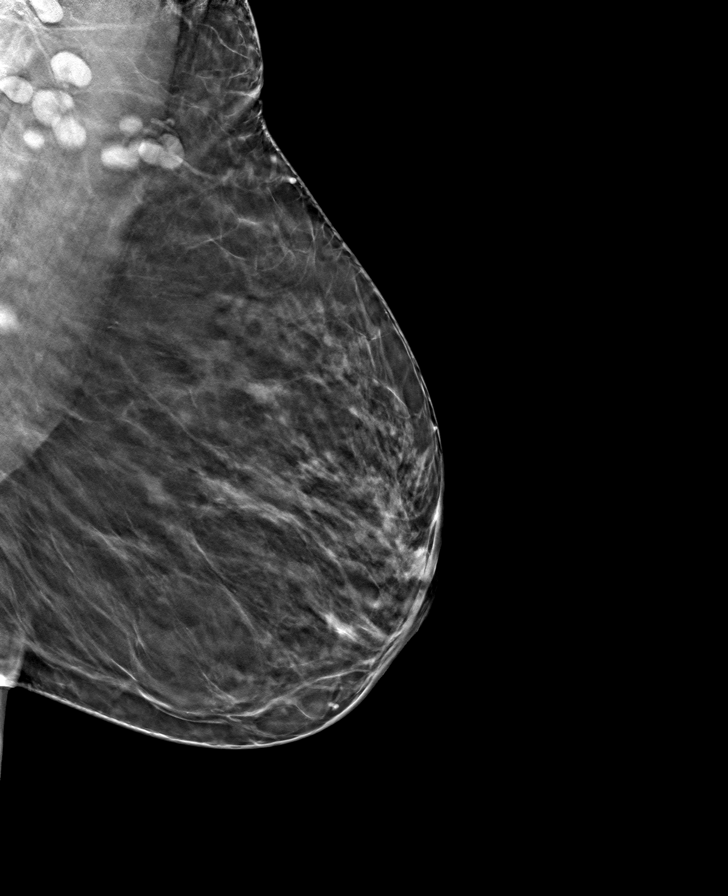

[8 of 24 positions shown; findings below may reference images not displayed]

ACR Breast Density Category b: There are scattered areas of
fibroglandular density.
FINDINGS: There are no findings suspicious for malignancy. Images were
processed with CAD.
IMPRESSION: No mammographic evidence of malignancy. A result letter of this
screening mammogram will be mailed directly to the patient.

RECOMMENDATION:
Screening mammogram in one year. (Code:[id number])

BI-RADS CATEGORY  1: Negative.

## 2022-04-17 ENCOUNTER — Telehealth: Payer: Self-pay

## 2022-04-17 NOTE — Telephone Encounter (Signed)
Pt called to see if the she would be ok to not take citalopram (CELEXA) 20 MG tablet  until 04/24/22 when it is delivered.   I advised pt that a 30 day supply refill would be sent to [facility] on [address]   Please advise

## 2022-04-18 ENCOUNTER — Other Ambulatory Visit: Payer: Self-pay | Admitting: Nurse Practitioner

## 2022-04-18 DIAGNOSIS — F39 Unspecified mood [affective] disorder: Secondary | ICD-10-CM

## 2022-04-18 MED ORDER — CITALOPRAM HYDROBROMIDE 20 MG PO TABS: 20.0000 mg | ORAL_TABLET | Freq: Every day | ORAL | 1 refills | Status: DC

## 2022-04-18 NOTE — Telephone Encounter (Signed)
I apologize. The citalopram was just sent to [facility] .

## 2022-04-18 NOTE — Telephone Encounter (Signed)
Pt is requesting update on when Medication refill will be sent to the pharmacy.  Please advise

## 2022-05-04 ENCOUNTER — Other Ambulatory Visit: Payer: Self-pay | Admitting: Nurse Practitioner

## 2022-05-04 DIAGNOSIS — F39 Unspecified mood [affective] disorder: Secondary | ICD-10-CM

## 2022-05-24 ENCOUNTER — Other Ambulatory Visit: Payer: Self-pay

## 2022-05-24 DIAGNOSIS — E118 Type 2 diabetes mellitus with unspecified complications: Secondary | ICD-10-CM

## 2022-05-24 DIAGNOSIS — E119 Type 2 diabetes mellitus without complications: Secondary | ICD-10-CM

## 2022-05-24 DIAGNOSIS — E1169 Type 2 diabetes mellitus with other specified complication: Secondary | ICD-10-CM

## 2022-05-24 DIAGNOSIS — E1159 Type 2 diabetes mellitus with other circulatory complications: Secondary | ICD-10-CM

## 2022-05-24 MED ORDER — METFORMIN HCL ER 500 MG PO TB24: ORAL_TABLET | ORAL | 0 refills | Status: DC

## 2022-06-08 ENCOUNTER — Other Ambulatory Visit: Payer: Self-pay

## 2022-06-08 DIAGNOSIS — E1169 Type 2 diabetes mellitus with other specified complication: Secondary | ICD-10-CM

## 2022-06-08 DIAGNOSIS — I152 Hypertension secondary to endocrine disorders: Secondary | ICD-10-CM

## 2022-06-08 MED ORDER — AMLODIPINE BESYLATE 5 MG PO TABS: 5.0000 mg | ORAL_TABLET | Freq: Every day | ORAL | 1 refills | Status: DC

## 2022-06-08 MED ORDER — ATORVASTATIN CALCIUM 20 MG PO TABS: 20.0000 mg | ORAL_TABLET | Freq: Every day | ORAL | 1 refills | Status: DC

## 2022-06-21 ENCOUNTER — Other Ambulatory Visit: Payer: Self-pay

## 2022-06-21 DIAGNOSIS — F39 Unspecified mood [affective] disorder: Secondary | ICD-10-CM

## 2022-06-21 MED ORDER — CITALOPRAM HYDROBROMIDE 20 MG PO TABS: 20.0000 mg | ORAL_TABLET | Freq: Every day | ORAL | 1 refills | Status: DC

## 2022-06-30 NOTE — Assessment & Plan Note (Signed)
Patient was not fasting today but opted to continue with blood work since she was already here and would have a difficult time making it back.  Discussed that her not being fasting could alter the accuracy of the results of the lipid panel.  She expressed understanding and decided to proceed with labs. Recommend to continue atorvastatin 20 mg at bedtime.  Continue heart healthy diet.

## 2022-06-30 NOTE — Progress Notes (Unsigned)
   Established Patient Office Visit  Subjective   Patient ID: Rachel Camacho, female    DOB: 19-Nov-1945  Age: 77 y.o. MRN: [id number]  No chief complaint on file.   HPI  {History (Optional):23778}  ROS    Objective:     There were no vitals taken for this visit. {Vitals History (Optional):23777}  Physical Exam   No results found for any visits on 07/03/22.  {Labs (Optional):23779}  The 10-year ASCVD risk score (Arnett DK, et al., 2019) is: 31%    Assessment & Plan:   Problem List Items Addressed This Visit       Cardiovascular and Mediastinum   Hypertension associated with diabetes ([place]) - Primary (Chronic)    Controlled. Continue amlodipine 5 mg and losartan 100 mg daily. Will collect CMP for medication monitoring with lab visit. Recommend to follow a low salt diet.         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 Will collect urine microalbumin with lab visit.        Mixed diabetic hyperlipidemia associated with type 2 diabetes mellitus ([place]) (Chronic)    Advised patient due for repeat fasting lipid panel, advised to schedule lab visit for routine fasting blood-work. Recommend to continue atorvastatin 20 mg at bedtime. Will collect hepatic function with lab visit as well. Discussed heart healthy diet.       Diabetes mellitus with complication (HCC)    N4B 6.6, remains at goal <7.0. Will continue Metformin XR 500 mg 2 tablets daily. Continue ambulatory glucose monitoring and recommend to reduce sugar intake. Discussed stress reduction.          Other   Mood disorder (HCC) (Chronic)    PHQ-9 score of 3 and GAD-7 score 2. Discussed with patient due to increased anxiety recommend treatment adjustments. Pt prefers to trial magnesium supplement to see if it helps with  anxiety during the day. Advised if anxiety fails to improve with supplement then recommend changing Citalopram to Lexapro or Duloxetine. Discussed with patient will resend rx for diazepam 5 mg to take before dental procedures, PDMP reviewed and did not fill rx sent last month. Discussed adverse effects with benzodiazapine therapy especially with long-term use and do not recommend taking diazepam daily or weekly for anxiety, recommend changing SSRI to help improve anxiety. Pt verbalized understanding. If patient will continue to need diazepam for future dental procedures then recommend obtaining a controlled substance contract.        No follow-ups on file.    Velva Harman, PA

## 2022-06-30 NOTE — Assessment & Plan Note (Signed)
Controlled. Continue amlodipine 5 mg and losartan 100 mg daily. Will collect CMP for medication monitoring with lab visit. Recommend to follow a low salt diet.

## 2022-06-30 NOTE — Assessment & Plan Note (Signed)
Continue Metformin XR 500 mg 2 tablets daily. Continue ambulatory glucose monitoring and recommend to reduce sugar intake.  Ordered hemoglobin A1c for today.

## 2022-06-30 NOTE — Assessment & Plan Note (Signed)
On ARB therapy. Will collect urine microalbumin with lab visit.

## 2022-06-30 NOTE — Assessment & Plan Note (Signed)
Patient stated that she believes that her mood is back to normal.  She does not feel the need to continue using the diazepam.  She wants to taper off of it.  There seems to be some confusion about whether she wants to stop the diazepam or the citalopram.  Message was sent to her to clarify further, she will come back to the office to discuss how to taper off of the citalopram if that is truly the one that she wants to stop.

## 2022-07-03 ENCOUNTER — Ambulatory Visit: Payer: Medicare Other | Admitting: Physician Assistant

## 2022-07-03 ENCOUNTER — Encounter: Payer: Self-pay | Admitting: Family Medicine

## 2022-07-03 ENCOUNTER — Ambulatory Visit (INDEPENDENT_AMBULATORY_CARE_PROVIDER_SITE_OTHER): Payer: Medicare Other | Admitting: Family Medicine

## 2022-07-03 ENCOUNTER — Encounter (INDEPENDENT_AMBULATORY_CARE_PROVIDER_SITE_OTHER): Payer: Medicare Other | Admitting: Family Medicine

## 2022-07-03 VITALS — BP 127/74 | HR 64 | Resp 18 | Ht 63.0 in | Wt 152.0 lb

## 2022-07-03 DIAGNOSIS — N183 Chronic kidney disease, stage 3 unspecified: Secondary | ICD-10-CM

## 2022-07-03 DIAGNOSIS — E782 Mixed hyperlipidemia: Secondary | ICD-10-CM

## 2022-07-03 DIAGNOSIS — R809 Proteinuria, unspecified: Secondary | ICD-10-CM

## 2022-07-03 DIAGNOSIS — E118 Type 2 diabetes mellitus with unspecified complications: Secondary | ICD-10-CM

## 2022-07-03 DIAGNOSIS — E1169 Type 2 diabetes mellitus with other specified complication: Secondary | ICD-10-CM

## 2022-07-03 DIAGNOSIS — E1129 Type 2 diabetes mellitus with other diabetic kidney complication: Secondary | ICD-10-CM

## 2022-07-03 DIAGNOSIS — E0822 Diabetes mellitus due to underlying condition with diabetic chronic kidney disease: Secondary | ICD-10-CM | POA: Diagnosis not present

## 2022-07-03 DIAGNOSIS — Z86010 Personal history of colonic polyps: Secondary | ICD-10-CM | POA: Diagnosis not present

## 2022-07-03 DIAGNOSIS — E1159 Type 2 diabetes mellitus with other circulatory complications: Secondary | ICD-10-CM | POA: Diagnosis not present

## 2022-07-03 DIAGNOSIS — F39 Unspecified mood [affective] disorder: Secondary | ICD-10-CM | POA: Diagnosis not present

## 2022-07-03 DIAGNOSIS — I152 Hypertension secondary to endocrine disorders: Secondary | ICD-10-CM | POA: Diagnosis not present

## 2022-07-03 NOTE — Patient Instructions (Signed)
Follow up in 3 months for annual wellness visit

## 2022-07-03 NOTE — Assessment & Plan Note (Addendum)
>>  ASSESSMENT AND PLAN FOR DIABETES MELLITUS DUE TO UNDERLYING CONDITION WITH STAGE 3 CHRONIC KIDNEY DISEASE, WITHOUT LONG-TERM CURRENT USE OF INSULIN (HCC) WRITTEN ON 07/03/2022 11:58 AM BY Lorn Butcher A, PA  Continue metformin and glucose monitoring at home  >>ASSESSMENT AND PLAN FOR DIABETES MELLITUS WITH COMPLICATION (HCC) WRITTEN ON 07/03/2022 12:01 PM BY Huntleigh Doolen A, PA  Continue Metformin XR 500 mg 2 tablets daily. Continue ambulatory glucose monitoring and recommend to reduce sugar intake.  Ordered hemoglobin A1c for today.

## 2022-07-04 LAB — COMPREHENSIVE METABOLIC PANEL
ALT: 13 IU/L (ref 0–32)
AST: 17 IU/L (ref 0–40)
Albumin/Globulin Ratio: 2.1 (ref 1.2–2.2)
Albumin: 4.7 g/dL (ref 3.8–4.8)
Alkaline Phosphatase: 78 IU/L (ref 44–121)
BUN/Creatinine Ratio: 15 (ref 12–28)
BUN: 14 mg/dL (ref 8–27)
Bilirubin Total: 0.7 mg/dL (ref 0.0–1.2)
CO2: 24 mmol/L (ref 20–29)
Calcium: 9.4 mg/dL (ref 8.7–10.3)
Chloride: 102 mmol/L (ref 96–106)
Creatinine, Ser: 0.93 mg/dL (ref 0.57–1.00)
Globulin, Total: 2.2 g/dL (ref 1.5–4.5)
Glucose: 183 mg/dL — ABNORMAL HIGH (ref 70–99)
Potassium: 4.4 mmol/L (ref 3.5–5.2)
Sodium: 141 mmol/L (ref 134–144)
Total Protein: 6.9 g/dL (ref 6.0–8.5)
eGFR: 63 mL/min/{1.73_m2} (ref >59)

## 2022-07-04 LAB — CBC WITH DIFFERENTIAL/PLATELET
Basophils Absolute: 0 10*3/uL (ref 0.0–0.2)
Basos: 1 %
EOS (ABSOLUTE): 0.5 10*3/uL — ABNORMAL HIGH (ref 0.0–0.4)
Eos: 8 %
Hematocrit: 38.2 % (ref 34.0–46.6)
Hemoglobin: 12.8 g/dL (ref 11.1–15.9)
Immature Grans (Abs): 0 10*3/uL (ref 0.0–0.1)
Immature Granulocytes: 0 %
Lymphocytes Absolute: 1.8 10*3/uL (ref 0.7–3.1)
Lymphs: 29 %
MCH: 28.3 pg (ref 26.6–33.0)
MCHC: 33.5 g/dL (ref 31.5–35.7)
MCV: 85 fL (ref 79–97)
Monocytes Absolute: 0.3 10*3/uL (ref 0.1–0.9)
Monocytes: 5 %
Neutrophils Absolute: 3.7 10*3/uL (ref 1.4–7.0)
Neutrophils: 57 %
Platelets: 284 10*3/uL (ref 150–450)
RBC: 4.52 x10E6/uL (ref 3.77–5.28)
RDW: 13.2 % (ref 11.7–15.4)
WBC: 6.4 10*3/uL (ref 3.4–10.8)

## 2022-07-04 LAB — HEMOGLOBIN A1C
Est. average glucose Bld gHb Est-mCnc: 143 mg/dL
Hgb A1c MFr Bld: 6.6 % — ABNORMAL HIGH (ref 4.8–5.6)

## 2022-07-04 LAB — LIPID PANEL
Chol/HDL Ratio: 2.2 ratio (ref 0.0–4.4)
Cholesterol, Total: 151 mg/dL (ref 100–199)
HDL: 69 mg/dL (ref >39)
LDL Chol Calc (NIH): 68 mg/dL (ref 0–99)
Triglycerides: 71 mg/dL (ref 0–149)
VLDL Cholesterol Cal: 14 mg/dL (ref 5–40)

## 2022-07-18 DIAGNOSIS — H524 Presbyopia: Secondary | ICD-10-CM | POA: Diagnosis not present

## 2022-07-18 DIAGNOSIS — H2513 Age-related nuclear cataract, bilateral: Secondary | ICD-10-CM | POA: Diagnosis not present

## 2022-07-18 DIAGNOSIS — E119 Type 2 diabetes mellitus without complications: Secondary | ICD-10-CM | POA: Diagnosis not present

## 2022-07-18 LAB — HM DIABETES EYE EXAM

## 2022-07-19 ENCOUNTER — Encounter: Payer: Self-pay | Admitting: Family Medicine

## 2022-07-24 ENCOUNTER — Other Ambulatory Visit: Payer: Self-pay | Admitting: Nurse Practitioner

## 2022-07-24 DIAGNOSIS — E118 Type 2 diabetes mellitus with unspecified complications: Secondary | ICD-10-CM

## 2022-07-25 NOTE — Telephone Encounter (Signed)
Please see the MyChart message reply(ies) for my assessment and plan.    This patient gave consent for this Medical Advice Message and is aware that it may result in a bill to [employer], as well as the possibility of receiving a bill for a co-payment or deductible. They are an established patient, but are not seeking medical advice exclusively about a problem treated during an in person or video visit in the last seven days. I did not recommend an in person or video visit within seven days of my reply.    I spent a total of 30 minutes cumulative time within 7 days through [employer].  Velva Harman, PA

## 2022-08-08 ENCOUNTER — Encounter: Payer: Self-pay | Admitting: Family Medicine

## 2022-08-08 DIAGNOSIS — Z86010 Personal history of colonic polyps: Secondary | ICD-10-CM

## 2022-08-08 DIAGNOSIS — R197 Diarrhea, unspecified: Secondary | ICD-10-CM

## 2022-08-12 ENCOUNTER — Other Ambulatory Visit: Payer: Self-pay | Admitting: Nurse Practitioner

## 2022-08-12 DIAGNOSIS — E1169 Type 2 diabetes mellitus with other specified complication: Secondary | ICD-10-CM

## 2022-08-12 DIAGNOSIS — I152 Hypertension secondary to endocrine disorders: Secondary | ICD-10-CM

## 2022-08-24 MED ORDER — CITALOPRAM HYDROBROMIDE 10 MG PO TABS: 10.0000 mg | ORAL_TABLET | Freq: Every day | ORAL | 1 refills | Status: DC

## 2022-08-24 NOTE — Addendum Note (Signed)
Addended by: Virgil Benedict on: 08/24/2022 04:56 PM   Modules accepted: Orders

## 2022-09-12 ENCOUNTER — Ambulatory Visit
Admission: EM | Admit: 2022-09-12 | Discharge: 2022-09-12 | Disposition: A | Discharge status: Home / Self Care | Payer: Medicare Other | Attending: Urgent Care | Admitting: Urgent Care

## 2022-09-12 ENCOUNTER — Ambulatory Visit (INDEPENDENT_AMBULATORY_CARE_PROVIDER_SITE_OTHER): Payer: Medicare Other

## 2022-09-12 DIAGNOSIS — S161XXA Strain of muscle, fascia and tendon at neck level, initial encounter: Secondary | ICD-10-CM

## 2022-09-12 DIAGNOSIS — S62650A Nondisplaced fracture of medial phalanx of right index finger, initial encounter for closed fracture: Secondary | ICD-10-CM | POA: Diagnosis not present

## 2022-09-12 DIAGNOSIS — M542 Cervicalgia: Secondary | ICD-10-CM | POA: Diagnosis not present

## 2022-09-12 DIAGNOSIS — E119 Type 2 diabetes mellitus without complications: Secondary | ICD-10-CM

## 2022-09-12 DIAGNOSIS — M47812 Spondylosis without myelopathy or radiculopathy, cervical region: Secondary | ICD-10-CM

## 2022-09-12 DIAGNOSIS — Z041 Encounter for examination and observation following transport accident: Secondary | ICD-10-CM | POA: Diagnosis not present

## 2022-09-12 MED ORDER — PREDNISONE 20 MG PO TABS: 20.0000 mg | ORAL_TABLET | Freq: Every day | ORAL | 0 refills | Status: DC

## 2022-09-12 NOTE — Discharge Instructions (Signed)
Wear the splint as much as possible. Follow up with [facility].
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
## 2022-09-13 DIAGNOSIS — Z86010 Personal history of colonic polyps: Secondary | ICD-10-CM | POA: Diagnosis not present

## 2022-09-13 DIAGNOSIS — K529 Noninfective gastroenteritis and colitis, unspecified: Secondary | ICD-10-CM | POA: Diagnosis not present

## 2022-09-18 DIAGNOSIS — M47812 Spondylosis without myelopathy or radiculopathy, cervical region: Secondary | ICD-10-CM | POA: Insufficient documentation

## 2022-09-19 DIAGNOSIS — S62620A Displaced fracture of medial phalanx of right index finger, initial encounter for closed fracture: Secondary | ICD-10-CM | POA: Diagnosis not present

## 2022-09-20 ENCOUNTER — Other Ambulatory Visit: Payer: Self-pay | Admitting: Family Medicine

## 2022-09-20 DIAGNOSIS — F39 Unspecified mood [affective] disorder: Secondary | ICD-10-CM

## 2022-10-02 ENCOUNTER — Encounter: Payer: Medicare Other | Admitting: Family Medicine

## 2022-10-03 ENCOUNTER — Telehealth: Payer: Self-pay

## 2022-10-03 DIAGNOSIS — S62620A Displaced fracture of medial phalanx of right index finger, initial encounter for closed fracture: Secondary | ICD-10-CM | POA: Diagnosis not present

## 2022-10-03 NOTE — Telephone Encounter (Signed)
Unsuccessful attempt to reach patient on preferred number listed in notes for scheduled AWV. Left message on voicemail okay to reschedule. 

## 2022-10-04 DIAGNOSIS — M542 Cervicalgia: Secondary | ICD-10-CM | POA: Diagnosis not present

## 2022-10-10 DIAGNOSIS — D122 Benign neoplasm of ascending colon: Secondary | ICD-10-CM | POA: Diagnosis not present

## 2022-10-10 DIAGNOSIS — D123 Benign neoplasm of transverse colon: Secondary | ICD-10-CM | POA: Diagnosis not present

## 2022-10-10 DIAGNOSIS — Z09 Encounter for follow-up examination after completed treatment for conditions other than malignant neoplasm: Secondary | ICD-10-CM | POA: Diagnosis not present

## 2022-10-10 DIAGNOSIS — Z86010 Personal history of colonic polyps: Secondary | ICD-10-CM | POA: Diagnosis not present

## 2022-10-10 DIAGNOSIS — D125 Benign neoplasm of sigmoid colon: Secondary | ICD-10-CM | POA: Diagnosis not present

## 2022-10-10 DIAGNOSIS — D124 Benign neoplasm of descending colon: Secondary | ICD-10-CM | POA: Diagnosis not present

## 2022-10-10 LAB — HM COLONOSCOPY

## 2022-10-11 DIAGNOSIS — M542 Cervicalgia: Secondary | ICD-10-CM | POA: Diagnosis not present

## 2022-10-12 DIAGNOSIS — D125 Benign neoplasm of sigmoid colon: Secondary | ICD-10-CM | POA: Diagnosis not present

## 2022-10-17 ENCOUNTER — Encounter: Payer: Self-pay | Admitting: Family Medicine

## 2022-10-18 ENCOUNTER — Encounter: Payer: Self-pay | Admitting: Family Medicine

## 2022-10-20 DIAGNOSIS — M542 Cervicalgia: Secondary | ICD-10-CM | POA: Diagnosis not present

## 2022-10-27 ENCOUNTER — Telehealth: Payer: Self-pay

## 2022-10-27 DIAGNOSIS — E118 Type 2 diabetes mellitus with unspecified complications: Secondary | ICD-10-CM

## 2022-10-27 MED ORDER — METFORMIN HCL ER 500 MG PO TB24: ORAL_TABLET | ORAL | 0 refills | Status: DC

## 2022-10-27 NOTE — Telephone Encounter (Signed)
Refill sent.

## 2022-10-27 NOTE — Telephone Encounter (Signed)
Pt is requesting a refill on: metFORMIN (GLUCOPHAGE-XR) 500 MG 24 hr tablet   Pharmacy: [id number] - [place], [place] - [address]   Pt ran out yesterday and is wanting enough to get to the appt on Wednesday 11/01/22

## 2022-10-31 DIAGNOSIS — S62620D Displaced fracture of medial phalanx of right index finger, subsequent encounter for fracture with routine healing: Secondary | ICD-10-CM | POA: Diagnosis not present

## 2022-11-01 ENCOUNTER — Encounter: Payer: Self-pay | Admitting: Family Medicine

## 2022-11-01 ENCOUNTER — Ambulatory Visit (INDEPENDENT_AMBULATORY_CARE_PROVIDER_SITE_OTHER): Payer: Medicare Other | Admitting: Family Medicine

## 2022-11-01 VITALS — BP 126/75 | HR 71 | Resp 18 | Ht 63.0 in | Wt 157.0 lb

## 2022-11-01 DIAGNOSIS — Z7984 Long term (current) use of oral hypoglycemic drugs: Secondary | ICD-10-CM

## 2022-11-01 DIAGNOSIS — E1169 Type 2 diabetes mellitus with other specified complication: Secondary | ICD-10-CM

## 2022-11-01 DIAGNOSIS — E782 Mixed hyperlipidemia: Secondary | ICD-10-CM | POA: Diagnosis not present

## 2022-11-01 DIAGNOSIS — E0822 Diabetes mellitus due to underlying condition with diabetic chronic kidney disease: Secondary | ICD-10-CM | POA: Diagnosis not present

## 2022-11-01 DIAGNOSIS — N183 Chronic kidney disease, stage 3 unspecified: Secondary | ICD-10-CM | POA: Diagnosis not present

## 2022-11-01 DIAGNOSIS — I152 Hypertension secondary to endocrine disorders: Secondary | ICD-10-CM | POA: Diagnosis not present

## 2022-11-01 DIAGNOSIS — Z23 Encounter for immunization: Secondary | ICD-10-CM | POA: Diagnosis not present

## 2022-11-01 LAB — POCT GLYCOSYLATED HEMOGLOBIN (HGB A1C): HbA1c POC (<> result, manual entry): 7.8 % (ref 4.0–5.6)

## 2022-11-01 MED ORDER — ATORVASTATIN CALCIUM 20 MG PO TABS
20.0000 mg | ORAL_TABLET | Freq: Every day | ORAL | 2 refills | Status: DC
Start: 2022-11-01 — End: 2023-04-26

## 2022-11-01 MED ORDER — LOSARTAN POTASSIUM 100 MG PO TABS
100.0000 mg | ORAL_TABLET | Freq: Every day | ORAL | 3 refills | Status: DC
Start: 2022-11-01 — End: 2023-12-27

## 2022-11-01 MED ORDER — SHINGRIX 50 MCG/0.5ML IM SUSR
0.5000 mL | Freq: Once | INTRAMUSCULAR | 0 refills | Status: AC
Start: 2022-11-01 — End: 2022-11-01

## 2022-11-01 MED ORDER — METFORMIN HCL ER 500 MG PO TB24
ORAL_TABLET | ORAL | 0 refills | Status: DC
Start: 2022-11-01 — End: 2022-12-05

## 2022-11-01 MED ORDER — TETANUS-DIPHTH-ACELL PERTUSSIS 5-2.5-18.5 LF-MCG/0.5 IM SUSY
0.5000 mL | PREFILLED_SYRINGE | Freq: Once | INTRAMUSCULAR | 0 refills | Status: AC
Start: 2022-11-01 — End: 2022-11-01

## 2022-11-01 MED ORDER — AMLODIPINE BESYLATE 5 MG PO TABS
5.0000 mg | ORAL_TABLET | Freq: Every day | ORAL | 2 refills | Status: DC
Start: 2022-11-01 — End: 2023-04-26

## 2022-11-01 NOTE — Assessment & Plan Note (Signed)
Last lipid panel: LDL 68, HDL 69, triglycerides 71.  Continue atorvastatin 20 mg daily, heart healthy diet low in fats.  Will continue to monitor.
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
## 2022-11-01 NOTE — Assessment & Plan Note (Signed)
Controlled. Continue amlodipine 5 mg and losartan 100 mg daily.
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
## 2022-11-01 NOTE — Patient Instructions (Addendum)
Meloxicam is a very similar medication to ibuprofen, so you will continue to take the meloxicam instead of ibuprofen.  You actually have a condition called spondylosis, not spondylolysis. Spondylosis is a breakdown of the cartilage that cushions your vertebrae, such as in your neck. This is why urgent care recommended to see orthopedics.

## 2022-11-03 ENCOUNTER — Other Ambulatory Visit: Payer: Self-pay | Admitting: Family Medicine

## 2022-11-03 DIAGNOSIS — F39 Unspecified mood [affective] disorder: Secondary | ICD-10-CM

## 2022-11-08 DIAGNOSIS — S62620A Displaced fracture of medial phalanx of right index finger, initial encounter for closed fracture: Secondary | ICD-10-CM | POA: Diagnosis not present

## 2022-11-08 DIAGNOSIS — M542 Cervicalgia: Secondary | ICD-10-CM | POA: Diagnosis not present

## 2022-12-04 ENCOUNTER — Encounter: Payer: Self-pay | Admitting: Family Medicine

## 2022-12-05 ENCOUNTER — Other Ambulatory Visit: Payer: Self-pay | Admitting: Family Medicine

## 2022-12-05 DIAGNOSIS — N183 Chronic kidney disease, stage 3 unspecified: Secondary | ICD-10-CM

## 2022-12-06 ENCOUNTER — Encounter: Payer: Self-pay | Admitting: Family Medicine

## 2022-12-06 ENCOUNTER — Ambulatory Visit (INDEPENDENT_AMBULATORY_CARE_PROVIDER_SITE_OTHER): Payer: Medicare Other | Admitting: Family Medicine

## 2022-12-06 DIAGNOSIS — R5383 Other fatigue: Secondary | ICD-10-CM

## 2022-12-06 DIAGNOSIS — R42 Dizziness and giddiness: Secondary | ICD-10-CM
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
## 2022-12-06 NOTE — Patient Instructions (Signed)
Once we have your lab results and imaging results back, I will get in touch with you to figure out what our next steps are.

## 2022-12-07 ENCOUNTER — Telehealth: Payer: Self-pay

## 2022-12-07 ENCOUNTER — Other Ambulatory Visit: Payer: Medicare Other

## 2022-12-07 DIAGNOSIS — R42 Dizziness and giddiness: Secondary | ICD-10-CM

## 2022-12-07 DIAGNOSIS — M25561 Pain in right knee: Secondary | ICD-10-CM

## 2022-12-07 DIAGNOSIS — R5383 Other fatigue: Secondary | ICD-10-CM | POA: Diagnosis not present

## 2022-12-07 MED ORDER — IBUPROFEN 600 MG PO TABS
600.0000 mg | ORAL_TABLET | Freq: Three times a day (TID) | ORAL | 0 refills | Status: DC | PRN
Start: 2022-12-07 — End: 2023-01-09

## 2022-12-07 NOTE — Telephone Encounter (Signed)
Refill has been sent.  °

## 2022-12-07 NOTE — Telephone Encounter (Signed)
Prescription Request  12/07/2022  LOV: 12/05/22  What is the name of the medication or equipment?   ibuprofen (ADVIL) 600 MG tablet    Have you contacted your pharmacy to request a refill? Yes   Which pharmacy would you like this sent to?  [id number] [place] [place] - [address] [place] [address] Phone: [phone] Fax: [phone]  Patient notified that their request is being sent to the clinical staff for review and that they should receive a response within 2 business days.   Please advise at Mobile [phone] (mobile)

## 2022-12-07 NOTE — Addendum Note (Signed)
Addended by: Tonny Bollman on: 12/07/2022 01:03 PM   Modules accepted: Orders

## 2022-12-08 LAB — CBC WITH DIFFERENTIAL/PLATELET
Basophils Absolute: 0 10*3/uL (ref 0.0–0.2)
Basos: 1 %
EOS (ABSOLUTE): 0.3 10*3/uL (ref 0.0–0.4)
Eos: 5 %
Hematocrit: 38.6 % (ref 34.0–46.6)
Hemoglobin: 13.1 g/dL (ref 11.1–15.9)
Immature Grans (Abs): 0 10*3/uL (ref 0.0–0.1)
Immature Granulocytes: 0 %
Lymphocytes Absolute: 2.6 10*3/uL (ref 0.7–3.1)
Lymphs: 42 %
MCH: 28.8 pg (ref 26.6–33.0)
MCHC: 33.9 g/dL (ref 31.5–35.7)
MCV: 85 fL (ref 79–97)
Monocytes Absolute: 0.3 10*3/uL (ref 0.1–0.9)
Monocytes: 6 %
Neutrophils Absolute: 2.8 10*3/uL (ref 1.4–7.0)
Neutrophils: 46 %
Platelets: 222 10*3/uL (ref 150–450)
RBC: 4.55 x10E6/uL (ref 3.77–5.28)
RDW: 13.1 % (ref 11.7–15.4)
WBC: 6 10*3/uL (ref 3.4–10.8)

## 2022-12-08 LAB — COMPREHENSIVE METABOLIC PANEL
ALT: 21 IU/L (ref 0–32)
AST: 24 IU/L (ref 0–40)
Albumin: 4.6 g/dL (ref 3.8–4.8)
Alkaline Phosphatase: 84 IU/L (ref 44–121)
BUN/Creatinine Ratio: 16 (ref 12–28)
BUN: 14 mg/dL (ref 8–27)
Bilirubin Total: 0.7 mg/dL (ref 0.0–1.2)
CO2: 21 mmol/L (ref 20–29)
Calcium: 9.4 mg/dL (ref 8.7–10.3)
Chloride: 104 mmol/L (ref 96–106)
Creatinine, Ser: 0.86 mg/dL (ref 0.57–1.00)
Globulin, Total: 2.5 g/dL (ref 1.5–4.5)
Glucose: 172 mg/dL — ABNORMAL HIGH (ref 70–99)
Potassium: 4.2 mmol/L (ref 3.5–5.2)
Sodium: 140 mmol/L (ref 134–144)
Total Protein: 7.1 g/dL (ref 6.0–8.5)
eGFR: 69 mL/min/{1.73_m2} (ref >59)

## 2022-12-08 LAB — TSH: TSH: 2.3 u[IU]/mL (ref 0.450–4.500)

## 2022-12-08 LAB — VITAMIN D 25 HYDROXY (VIT D DEFICIENCY, FRACTURES): Vit D, 25-Hydroxy: 89.7 ng/mL (ref 30.0–100.0)

## 2022-12-14 ENCOUNTER — Ambulatory Visit
Admission: RE | Admit: 2022-12-14 | Discharge: 2022-12-14 | Disposition: A | Discharge status: Home / Self Care | Payer: Medicare Other | Source: Ambulatory Visit | Attending: Family Medicine | Admitting: Family Medicine

## 2022-12-14 DIAGNOSIS — R519 Headache, unspecified: Secondary | ICD-10-CM | POA: Diagnosis not present

## 2022-12-14 DIAGNOSIS — R42 Dizziness and giddiness: Secondary | ICD-10-CM

## 2022-12-14 DIAGNOSIS — R5383 Other fatigue: Secondary | ICD-10-CM

## 2022-12-19 ENCOUNTER — Ambulatory Visit (INDEPENDENT_AMBULATORY_CARE_PROVIDER_SITE_OTHER): Payer: Medicare Other

## 2022-12-19 VITALS — Ht 63.0 in | Wt 156.0 lb

## 2022-12-19 DIAGNOSIS — Z0000 Encounter for general adult medical examination without abnormal findings: Secondary | ICD-10-CM

## 2022-12-19 NOTE — Patient Instructions (Addendum)
Rachel Camacho , Thank you for taking time to come for your Medicare Wellness Visit. I appreciate your ongoing commitment to your health goals. Please review the following plan we discussed and let me know if I can assist you in the future.   Referrals/Orders/Follow-Ups/Clinician Recommendations:   This is a list of the screening recommended for you and due dates:  Health Maintenance  Topic Date Due   DTaP/Tdap/Td vaccine (1 - Tdap) Never done   Zoster (Shingles) Vaccine (1 of 2) Never done   COVID-19 Vaccine (4 - 2023-24 season) 01/20/2022   Complete foot exam   07/12/2022   Flu Shot  12/21/2022   Yearly kidney health urinalysis for diabetes  03/15/2023   Hemoglobin A1C  05/03/2023   Eye exam for diabetics  07/19/2023   Yearly kidney function blood test for diabetes  12/07/2023   Medicare Annual Wellness Visit  12/19/2023   Pneumonia Vaccine  Completed   DEXA scan (bone density measurement)  Completed   Hepatitis C Screening  Completed   HPV Vaccine  Aged Out   Colon Cancer Screening  Discontinued    Advanced directives: (Declined) Advance directive discussed with you today. Even though you declined this today, please call our office should you change your mind, and we can give you the proper paperwork for you to fill out.  Next Medicare Annual Wellness Visit scheduled for next year: Yes  Preventive Care 33 Years and Older, Female Preventive care refers to lifestyle choices and visits with your health care provider that can promote health and wellness. What does preventive care include? A yearly physical exam. This is also called an annual well check. Dental exams once or twice a year. Routine eye exams. Ask your health care provider how often you should have your eyes checked. Personal lifestyle choices, including: Daily care of your teeth and gums. Regular physical activity. Eating a healthy diet. Avoiding tobacco and drug use. Limiting alcohol use. Practicing safe  sex. Taking low-dose aspirin every day. Taking vitamin and mineral supplements as recommended by your health care provider. What happens during an annual well check? The services and screenings done by your health care provider during your annual well check will depend on your age, overall health, lifestyle risk factors, and family history of disease. Counseling  Your health care provider may ask you questions about your: Alcohol use. Tobacco use. Drug use. Emotional well-being. Home and relationship well-being. Sexual activity. Eating habits. History of falls. Memory and ability to understand (cognition). Work and work Astronomer. Reproductive health. Screening  You may have the following tests or measurements: Height, weight, and BMI. Blood pressure. Lipid and cholesterol levels. These may be checked every 5 years, or more frequently if you are over 17 years old. Skin check. Lung cancer screening. You may have this screening every year starting at age 98 if you have a 30-pack-year history of smoking and currently smoke or have quit within the past 15 years. Fecal occult blood test (FOBT) of the stool. You may have this test every year starting at age 66. Flexible sigmoidoscopy or colonoscopy. You may have a sigmoidoscopy every 5 years or a colonoscopy every 10 years starting at age 98. Hepatitis C blood test. Hepatitis B blood test. Sexually transmitted disease (STD) testing. Diabetes screening. This is done by checking your blood sugar (glucose) after you have not eaten for a while (fasting). You may have this done every 1-3 years. Bone density scan. This is done to screen for osteoporosis. You  may have this done starting at age 20. Mammogram. This may be done every 1-2 years. Talk to your health care provider about how often you should have regular mammograms. Talk with your health care provider about your test results, treatment options, and if necessary, the need for more  tests. Vaccines  Your health care provider may recommend certain vaccines, such as: Influenza vaccine. This is recommended every year. Tetanus, diphtheria, and acellular pertussis (Tdap, Td) vaccine. You may need a Td booster every 10 years. Zoster vaccine. You may need this after age 55. Pneumococcal 13-valent conjugate (PCV13) vaccine. One dose is recommended after age 12. Pneumococcal polysaccharide (PPSV23) vaccine. One dose is recommended after age 80. Talk to your health care provider about which screenings and vaccines you need and how often you need them. This information is not intended to replace advice given to you by your health care provider. Make sure you discuss any questions you have with your health care provider. Document Released: 06/04/2015 Document Revised: 01/26/2016 Document Reviewed: 03/09/2015 Elsevier Interactive Patient Education  2017 [employer].  Fall Prevention in the Home Falls can cause injuries. They can happen to people of all ages. There are many things you can do to make your home safe and to help prevent falls. What can I do on the outside of my home? Regularly fix the edges of walkways and driveways and fix any cracks. Remove anything that might make you trip as you walk through a door, such as a raised step or threshold. Trim any bushes or trees on the path to your home. Use bright outdoor lighting. Clear any walking paths of anything that might make someone trip, such as rocks or tools. Regularly check to see if handrails are loose or broken. Make sure that both sides of any steps have handrails. Any raised decks and porches should have guardrails on the edges. Have any leaves, snow, or ice cleared regularly. Use sand or salt on walking paths during winter. Clean up any spills in your garage right away. This includes oil or grease spills. What can I do in the bathroom? Use night lights. Install grab bars by the toilet and in the tub and shower.  Do not use towel bars as grab bars. Use non-skid mats or decals in the tub or shower. If you need to sit down in the shower, use a plastic, non-slip stool. Keep the floor dry. Clean up any water that spills on the floor as soon as it happens. Remove soap buildup in the tub or shower regularly. Attach bath mats securely with double-sided non-slip rug tape. Do not have throw rugs and other things on the floor that can make you trip. What can I do in the bedroom? Use night lights. Make sure that you have a light by your bed that is easy to reach. Do not use any sheets or blankets that are too big for your bed. They should not hang down onto the floor. Have a firm chair that has side arms. You can use this for support while you get dressed. Do not have throw rugs and other things on the floor that can make you trip. What can I do in the kitchen? Clean up any spills right away. Avoid walking on wet floors. Keep items that you use a lot in easy-to-reach places. If you need to reach something above you, use a strong step stool that has a grab bar. Keep electrical cords out of the way. Do not use floor  polish or wax that makes floors slippery. If you must use wax, use non-skid floor wax. Do not have throw rugs and other things on the floor that can make you trip. What can I do with my stairs? Do not leave any items on the stairs. Make sure that there are handrails on both sides of the stairs and use them. Fix handrails that are broken or loose. Make sure that handrails are as long as the stairways. Check any carpeting to make sure that it is firmly attached to the stairs. Fix any carpet that is loose or worn. Avoid having throw rugs at the top or bottom of the stairs. If you do have throw rugs, attach them to the floor with carpet tape. Make sure that you have a light switch at the top of the stairs and the bottom of the stairs. If you do not have them, ask someone to add them for you. What else  can I do to help prevent falls? Wear shoes that: Do not have high heels. Have rubber bottoms. Are comfortable and fit you well. Are closed at the toe. Do not wear sandals. If you use a stepladder: Make sure that it is fully opened. Do not climb a closed stepladder. Make sure that both sides of the stepladder are locked into place. Ask someone to hold it for you, if possible. Clearly mark and make sure that you can see: Any grab bars or handrails. First and last steps. Where the edge of each step is. Use tools that help you move around (mobility aids) if they are needed. These include: Canes. Walkers. Scooters. Crutches. Turn on the lights when you go into a dark area. Replace any light bulbs as soon as they burn out. Set up your furniture so you have a clear path. Avoid moving your furniture around. If any of your floors are uneven, fix them. If there are any pets around you, be aware of where they are. Review your medicines with your doctor. Some medicines can make you feel dizzy. This can increase your chance of falling. Ask your doctor what other things that you can do to help prevent falls. This information is not intended to replace advice given to you by your health care provider. Make sure you discuss any questions you have with your health care provider. Document Released: 03/04/2009 Document Revised: 10/14/2015 Document Reviewed: 06/12/2014 Elsevier Interactive Patient Education  2017 [employer].

## 2022-12-19 NOTE — Progress Notes (Signed)
Subjective:   Rachel Camacho is a 77 y.o. female who presents for Medicare Annual (Subsequent) preventive examination.  Visit Complete: Virtual  I connected with  Rachel Camacho on 12/19/22 by a audio enabled telemedicine application and verified that I am speaking with the correct person using two identifiers.  Patient Location: Home  Provider Location: Home Office  I discussed the limitations of evaluation and management by telemedicine. The patient expressed understanding and agreed to proceed.  Patient Medicare AWV questionnaire was completed by the patient on 09/30/22; I have confirmed that all information answered by patient is correct and no changes since this date.  Review of Systems    Vital Signs: Unable to obtain new vitals due to this being a telehealth visit.  Cardiac Risk Factors include: advanced age (>62men, >23 women);diabetes mellitus;hypertension     Objective:    Today's Vitals   12/19/22 1321 12/19/22 1323  Weight: 156 lb (70.8 kg)   Height: 5\' 3"  (1.6 m)   PainSc:  0-No pain   Body mass index is 27.63 kg/m.     12/19/2022    1:30 PM  Advanced Directives  Does Patient Have a Medical Advance Directive? No  Would patient like information on creating a medical advance directive? No - Patient declined    Current Medications (verified) Outpatient Encounter Medications as of 12/19/2022  Medication Sig   amLODipine (NORVASC) 5 MG tablet Take 1 tablet (5 mg total) by mouth daily.   atorvastatin (LIPITOR) 20 MG tablet Take 1 tablet (20 mg total) by mouth at bedtime.   Blood Glucose Monitoring Suppl (ONETOUCH VERIO) w/Device KIT Use to test glucose fating in AM and 2 hours after largest meal   Cholecalciferol (VITAMIN D-3) 5000 units TABS Take 1 tablet by mouth daily.   diazepam (VALIUM) 5 MG tablet Take 1 tablet (5 mg total) by mouth every 12 (twelve) hours as needed. for anxiety (Patient not taking: Reported on 12/19/2022)   glucose blood (ONETOUCH VERIO) test  strip USE TO CHECK FASTING GLUCOSE IN THE AM AND TO CHECK 2 HOURS AFTER LARGEST MEAL OF THE DAY.   ibuprofen (ADVIL) 600 MG tablet Take 1 tablet (600 mg total) by mouth every 8 (eight) hours as needed.   Lancets (ONETOUCH DELICA PLUS LANCET33G) MISC USE 2 LANCETS DAILY AS DIRECTED   losartan (COZAAR) 100 MG tablet Take 1 tablet (100 mg total) by mouth daily.   metFORMIN (GLUCOPHAGE-XR) 500 MG 24 hr tablet TAKE 2 TABLETS BY MOUTH DAILY AFTER SUPPER   No facility-administered encounter medications on file as of 12/19/2022.    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 Intial UPC 4.7 grams. Conservative Rx. 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 Eliott Nine)   History reviewed. No pertinent surgical history. History reviewed. No pertinent family history. Social History   Socioeconomic History   Marital status: Divorced    Spouse name: Not on file   Number of children: Not on file   Years of education: Not on file   Highest education level: Not on file  Occupational History   Not on file  Tobacco Use   Smoking status: Never    Passive exposure: Never   Smokeless tobacco: Never  Vaping Use   Vaping status: Never Used  Substance and  Sexual Activity   Alcohol use: Yes    Comment: social   Drug use: No   Sexual activity: Not Currently    Birth control/protection: None  Other Topics Concern   Not on file  Social History Narrative   Not on file   Social Determinants of Health   Financial Resource Strain: Low Risk  (12/19/2022)   Overall Financial Resource Strain (CARDIA)    Difficulty of Paying Living Expenses: Not hard at all  Food Insecurity: No Food Insecurity (12/19/2022)   Hunger Vital Sign    Worried About Running Out of Food in the Last Year: Never  true    Ran Out of Food in the Last Year: Never true  Transportation Needs: No Transportation Needs (12/19/2022)   PRAPARE - Administrator, Civil Service (Medical): No    Lack of Transportation (Non-Medical): No  Physical Activity: Sufficiently Active (12/19/2022)   Exercise Vital Sign    Days of Exercise per Week: 3 days    Minutes of Exercise per Session: 60 min  Stress: No Stress Concern Present (12/19/2022)   [employer] of Occupational Health - Occupational Stress Questionnaire    Feeling of Stress : Not at all  Social Connections: Moderately Integrated (12/19/2022)   Social Connection and Isolation Panel [NHANES]    Frequency of Communication with Friends and Family: More than three times a week    Frequency of Social Gatherings with Friends and Family: More than three times a week    Attends Religious Services: More than 4 times per year    Active Member of [employer] or Organizations: Yes    Attends Engineer, structural: More than 4 times per year    Marital Status: Divorced    Tobacco Counseling Counseling given: Not Answered   Clinical Intake:  Pre-visit preparation completed: Yes  Pain : No/denies pain Pain Score: 0-No pain     BMI - recorded: 27.63 Nutritional Status: BMI 25 -29 Overweight Nutritional Risks: None Diabetes: Yes CBG done?: No Did pt. bring in CBG monitor from home?: No  How often do you need to have someone help you when you read instructions, pamphlets, or other written materials from your doctor or pharmacy?: 1 - Never  Interpreter Needed?: No  Information entered by :: Rachel Mulligan LPN   Activities of Daily Living    12/19/2022    1:28 PM 09/30/2022    9:25 PM  In your present state of health, do you have any difficulty performing the following activities:  Hearing? 0 0  Vision? 0 0  Difficulty concentrating or making decisions? 0 0  Walking or climbing stairs? 0 0  Dressing or bathing? 0 0  Doing errands,  shopping? 0 0  Preparing Food and eating ? N N  Using the Toilet? N N  In the past six months, have you accidently leaked urine? N N  Do you have problems with loss of bowel control? N Y  Managing your Medications? N N  Managing your Finances? N N  Housekeeping or managing your Housekeeping? N N    Patient Care Team: [name], PA as PCP - General (Family Medicine) Camille Bal, MD as Consulting Physician (Nephrology)  Indicate any recent Medical Services you may have received from other than [facility] providers in the past year (date may be approximate).     Assessment:   This is a routine wellness examination for Rachel Camacho.  Hearing/Vision screen Hearing Screening - Comments:: Denies hearing difficulties  Vision Screening - Comments:: Wears rx glasses - up to date with routine eye exams with  [facility]  Dietary issues and exercise activities discussed:     Goals Addressed               This Visit's Progress     Be a better swimmer. (pt-stated)        Continue Art classes       Depression Screen    12/19/2022    1:28 PM 12/06/2022    3:03 PM 07/03/2022   11:25 AM 02/28/2022    2:15 PM 10/06/2021   11:09 AM 07/12/2021    1:52 PM 05/18/2021    3:43 PM  PHQ 2/9 Scores  PHQ - 2 Score 0 0 0 1 1 1  0  PHQ- 9 Score 0 2 0 3 2 4 2     Fall Risk    12/19/2022    1:29 PM 09/30/2022    9:25 PM 07/03/2022   11:25 AM 02/28/2022    2:15 PM 10/06/2021   11:09 AM  Fall Risk   Falls in the past year? 0 0 0 0 0  Number falls in past yr: 0 0 0 0 0  Injury with Fall? 0 0 0 0 0  Risk for fall due to : No Fall Risks   No Fall Risks No Fall Risks  Follow up Falls prevention discussed   Falls evaluation completed Falls evaluation completed    MEDICARE RISK AT HOME:  Medicare Risk at Home - 12/19/22 1338     Any stairs in or around the home? Yes    If so, are there any without handrails? No    Home free of loose throw rugs in walkways, pet beds, electrical cords, etc?  Yes    Adequate lighting in your home to reduce risk of falls? Yes    Life alert? No    Use of a cane, walker or w/c? No    Grab bars in the bathroom? Yes    Shower chair or bench in shower? No    Elevated toilet seat or a handicapped toilet? No             TIMED UP AND GO:  Was the test performed?  No    Cognitive Function:        12/19/2022    1:30 PM 08/27/2020   10:25 AM 08/27/2019   10:49 AM  6CIT Screen  What Year? 0 points 0 points 0 points  What month? 0 points 0 points 0 points  What time? 0 points 0 points 0 points  Count back from 20 0 points 0 points 0 points  Months in reverse 0 points 0 points 0 points  Repeat phrase 4 points 0 points 0 points  Total Score 4 points 0 points 0 points    Immunizations Immunization History  Administered Date(s) Administered   Influenza, High Dose Seasonal PF 05/08/2019   PFIZER(Purple Top)SARS-COV-2 Vaccination 07/20/2019, 08/19/2019, 10/07/2020   PPD Test 12/31/2017   Pneumococcal Conjugate-13 10/04/2017   Pneumococcal Polysaccharide-23 04/09/2018    TDAP status: Due, Education has been provided regarding the importance of this vaccine. Advised may receive this vaccine at local pharmacy or Health Dept. Aware to provide a copy of the vaccination record if obtained from local pharmacy or Health Dept. Verbalized acceptance and understanding.  Flu Vaccine status: Up to date  Pneumococcal vaccine status: Up to date  Covid-19 vaccine status: Completed vaccines  Qualifies for Shingles Vaccine?  Yes   Zostavax completed No   Shingrix Completed?: No.    Education has been provided regarding the importance of this vaccine. Patient has been advised to call insurance company to determine out of pocket expense if they have not yet received this vaccine. Advised may also receive vaccine at local pharmacy or Health Dept. Verbalized acceptance and understanding.  Screening Tests Health Maintenance  Topic Date Due   DTaP/Tdap/Td (1 -  Tdap) Never done   Zoster Vaccines- Shingrix (1 of 2) Never done   COVID-19 Vaccine (4 - 2023-24 season) 01/20/2022   FOOT EXAM  07/12/2022   INFLUENZA VACCINE  12/21/2022   Diabetic kidney evaluation - Urine ACR  03/15/2023   HEMOGLOBIN A1C  05/03/2023   OPHTHALMOLOGY EXAM  07/19/2023   Diabetic kidney evaluation - eGFR measurement  12/07/2023   Medicare Annual Wellness (AWV)  12/19/2023   Pneumonia Vaccine 17+ Years old  Completed   DEXA SCAN  Completed   Hepatitis C Screening  Completed   HPV VACCINES  Aged Out   Colonoscopy  Discontinued    Health Maintenance  Health Maintenance Due  Topic Date Due   DTaP/Tdap/Td (1 - Tdap) Never done   Zoster Vaccines- Shingrix (1 of 2) Never done   COVID-19 Vaccine (4 - 2023-24 season) 01/20/2022   FOOT EXAM  07/12/2022    Colorectal cancer screening: No longer required.   Mammogram status: No longer required due to Age.  Bone Density status: Completed 11/16/19. Results reflect: Bone density results: OSTEOPENIA. Repeat every   years.  Lung Cancer Screening: (Low Dose CT Chest recommended if Age 35-80 years, 20 pack-year currently smoking OR have quit w/in 15years.) does not qualify.     Additional Screening:  Hepatitis C Screening: does qualify; Completed 10/19/22  Vision Screening: Recommended annual ophthalmology exams for early detection of glaucoma and other disorders of the eye. Is the patient up to date with their annual eye exam?  Yes  Who is the provider or what is the name of the office in which the patient attends annual eye exams? [facility] If pt is not established with a provider, would they like to be referred to a provider to establish care? No .   Dental Screening: Recommended annual dental exams for proper oral hygiene  Diabetic Foot Exam: Diabetic Foot Exam: Overdue, Pt has been advised about the importance in completing this exam. Pt is scheduled for diabetic foot exam on Followed by PCP.  Community  Resource Referral / Chronic Care Management:  CRR required this visit?  No   CCM required this visit?  No     Plan:     I have personally reviewed and noted the following in the patient's chart:   Medical and social history Use of alcohol, tobacco or illicit drugs  Current medications and supplements including opioid prescriptions. Patient is not currently taking opioid prescriptions. Functional ability and status Nutritional status Physical activity Advanced directives List of other physicians Hospitalizations, surgeries, and ER visits in previous 12 months Vitals Screenings to include cognitive, depression, and falls Referrals and appointments  In addition, I have reviewed and discussed with patient certain preventive protocols, quality metrics, and best practice recommendations. A written personalized care plan for preventive services as well as general preventive health recommendations were provided to patient.     Tillie Rung, LPN   [id number]   After Visit Summary: (MyChart) Due to this being a telephonic visit, the after visit summary with patients personalized plan  was offered to patient via MyChart   Nurse Notes: None

## 2023-01-09 ENCOUNTER — Other Ambulatory Visit: Payer: Self-pay | Admitting: Family Medicine

## 2023-01-09 DIAGNOSIS — M25561 Pain in right knee: Secondary | ICD-10-CM

## 2023-01-18 ENCOUNTER — Other Ambulatory Visit: Payer: Self-pay

## 2023-01-18 DIAGNOSIS — R42 Dizziness and giddiness: Secondary | ICD-10-CM

## 2023-01-18 DIAGNOSIS — R5383 Other fatigue: Secondary | ICD-10-CM

## 2023-01-19 ENCOUNTER — Other Ambulatory Visit: Payer: Self-pay | Admitting: Nurse Practitioner

## 2023-01-19 DIAGNOSIS — F39 Unspecified mood [affective] disorder: Secondary | ICD-10-CM

## 2023-01-25 ENCOUNTER — Other Ambulatory Visit: Payer: Medicare Other

## 2023-01-25 DIAGNOSIS — R42 Dizziness and giddiness: Secondary | ICD-10-CM | POA: Diagnosis not present

## 2023-01-25 DIAGNOSIS — R5383 Other fatigue: Secondary | ICD-10-CM

## 2023-01-26 LAB — COMPREHENSIVE METABOLIC PANEL
ALT: 14 IU/L (ref 0–32)
AST: 22 IU/L (ref 0–40)
Albumin: 4.6 g/dL (ref 3.8–4.8)
Alkaline Phosphatase: 68 IU/L (ref 44–121)
BUN/Creatinine Ratio: 18 (ref 12–28)
BUN: 16 mg/dL (ref 8–27)
Bilirubin Total: 1.1 mg/dL (ref 0.0–1.2)
CO2: 20 mmol/L (ref 20–29)
Calcium: 9.7 mg/dL (ref 8.7–10.3)
Chloride: 103 mmol/L (ref 96–106)
Creatinine, Ser: 0.89 mg/dL (ref 0.57–1.00)
Globulin, Total: 2.7 g/dL (ref 1.5–4.5)
Glucose: 117 mg/dL — ABNORMAL HIGH (ref 70–99)
Potassium: 4.6 mmol/L (ref 3.5–5.2)
Sodium: 141 mmol/L (ref 134–144)
Total Protein: 7.3 g/dL (ref 6.0–8.5)
eGFR: 66 mL/min/{1.73_m2} (ref >59)

## 2023-02-01 ENCOUNTER — Encounter: Payer: Self-pay | Admitting: Family Medicine

## 2023-02-01 ENCOUNTER — Ambulatory Visit (INDEPENDENT_AMBULATORY_CARE_PROVIDER_SITE_OTHER): Payer: Medicare Other | Admitting: Family Medicine

## 2023-02-01 VITALS — BP 128/74 | HR 66 | Resp 18 | Ht 63.0 in | Wt 157.0 lb

## 2023-02-01 DIAGNOSIS — I152 Hypertension secondary to endocrine disorders: Secondary | ICD-10-CM | POA: Diagnosis not present

## 2023-02-01 DIAGNOSIS — E782 Mixed hyperlipidemia: Secondary | ICD-10-CM | POA: Diagnosis not present

## 2023-02-01 DIAGNOSIS — Z7984 Long term (current) use of oral hypoglycemic drugs: Secondary | ICD-10-CM

## 2023-02-01 DIAGNOSIS — N1831 Chronic kidney disease, stage 3a: Secondary | ICD-10-CM | POA: Diagnosis not present

## 2023-02-01 DIAGNOSIS — E118 Type 2 diabetes mellitus with unspecified complications: Secondary | ICD-10-CM | POA: Diagnosis not present

## 2023-02-01 DIAGNOSIS — E1159 Type 2 diabetes mellitus with other circulatory complications: Secondary | ICD-10-CM | POA: Diagnosis not present

## 2023-02-01 DIAGNOSIS — E0822 Diabetes mellitus due to underlying condition with diabetic chronic kidney disease: Secondary | ICD-10-CM | POA: Diagnosis not present

## 2023-02-01 DIAGNOSIS — E1169 Type 2 diabetes mellitus with other specified complication: Secondary | ICD-10-CM

## 2023-02-01 LAB — POCT GLYCOSYLATED HEMOGLOBIN (HGB A1C): HbA1c POC (<> result, manual entry): 7.5 % (ref 4.0–5.6)

## 2023-02-01 MED ORDER — METFORMIN HCL ER 500 MG PO TB24
ORAL_TABLET | ORAL | 1 refills | Status: DC
Start: 2023-02-01 — End: 2023-09-24

## 2023-02-01 NOTE — Patient Instructions (Addendum)
Continue to work on getting routine physical activity and cutting back some on the sweets that you are eating.  I am confident that if you do this we can get your A1C numbers back to where they usually are and below 7.0!  Since you have diabetes, you are at an increased risk of infection so it is very important to stay up-to-date on your immunizations.  You are due for the following, which Medicare requires that you get at the pharmacy rather than our office.  The next time you pick up your medicine, let them know that you need these shots: -Tetanus booster (Tdap) -Shingles (2 dose series)

## 2023-02-01 NOTE — Assessment & Plan Note (Signed)
A1c still elevated at 7.5.  We discussed the options of recommitting to nutritional and physical activity interventions versus intensifying metformin therapy.  She would like to make changes to her diet and start exercising again before increasing medication since she knows that she has not done so as well as she should the last few months.  Recheck A1c in 3 months, if necessary can add metformin dose of 1 tablet in the morning.

## 2023-02-01 NOTE — Assessment & Plan Note (Signed)
Last lipid panel: LDL 68, HDL 69, triglycerides 71.  Continue atorvastatin 20 mg daily, heart healthy diet low in fats.  Rechecking lipid panels at next appointment.  Will continue to monitor.

## 2023-02-01 NOTE — Progress Notes (Signed)
Established Patient Office Visit  Subjective   Patient ID: Rachel Camacho, female    DOB: [birth year]  Age: 77 y.o. MRN: [id number]  Chief Complaint  Patient presents with   Diabetes   Hyperlipidemia   Medical Management of Chronic Issues    HPI Shahira Lingg is a 77 y.o. female presenting today for follow up of hypertension, hyperlipidemia, diabetes. Hypertension: Patient here for follow-up of elevated blood pressure. She is not exercising and is adherent to low salt diet.   Pt denies chest pain, SOB, dizziness, edema, syncope, fatigue or heart palpitations. Taking amlodipine and losartan, reports excellent compliance with treatment. Denies side effects. Hyperlipidemia: tolerating atorvastatin well with no myalgias or significant side effects.  The 10-year ASCVD risk score (Arnett DK, et al., 2019) is: 33.3% Diabetes: denies hypoglycemic events, wounds or sores that are not healing well, increased thirst or urination. Denies vision problems, eye exam up-to-date. Checking glucose at home, she states that they have been higher than normal. Taking metformin as prescribed without any side effects.  She admits that she has not been as physically active and has been eating more sweets such as ice cream.  Outpatient Medications Prior to Visit  Medication Sig   amLODipine (NORVASC) 5 MG tablet Take 1 tablet (5 mg total) by mouth daily.   atorvastatin (LIPITOR) 20 MG tablet Take 1 tablet (20 mg total) by mouth at bedtime.   Blood Glucose Monitoring Suppl (ONETOUCH VERIO) w/Device KIT Use to test glucose fating in AM and 2 hours after largest meal   Cholecalciferol (VITAMIN D-3) 5000 units TABS Take 1 tablet by mouth daily.   glucose blood (ONETOUCH VERIO) test strip USE TO CHECK FASTING GLUCOSE IN THE AM AND TO CHECK 2 HOURS AFTER LARGEST MEAL OF THE DAY.   ibuprofen (ADVIL) 600 MG tablet TAKE 1 TABLET BY MOUTH EVERY 8 HOURS AS NEEDED.   Lancets (ONETOUCH DELICA PLUS LANCET33G) MISC USE 2 LANCETS  DAILY AS DIRECTED   losartan (COZAAR) 100 MG tablet Take 1 tablet (100 mg total) by mouth daily.   [DISCONTINUED] metFORMIN (GLUCOPHAGE-XR) 500 MG 24 hr tablet TAKE 2 TABLETS BY MOUTH DAILY AFTER SUPPER   [DISCONTINUED] diazepam (VALIUM) 5 MG tablet Take 1 tablet (5 mg total) by mouth every 12 (twelve) hours as needed. for anxiety   No facility-administered medications prior to visit.    ROS Negative unless otherwise noted in HPI   Objective:     BP 128/74 Comment: home BP  Pulse 66   Resp 18   Ht 5\' 3"  (1.6 m)   Wt 157 lb (71.2 kg)   SpO2 98%   BMI 27.81 kg/m   Physical Exam Constitutional:      General: She is not in acute distress.    Appearance: Normal appearance.  HENT:     Head: Normocephalic and atraumatic.  Cardiovascular:     Rate and Rhythm: Normal rate and regular rhythm.     Heart sounds: Murmur heard.     No friction rub. No gallop.  Pulmonary:     Effort: Pulmonary effort is normal. No respiratory distress.     Breath sounds: No wheezing, rhonchi or rales.  Skin:    General: Skin is warm and dry.  Neurological:     Mental Status: She is alert and oriented to person, place, and time.    Diabetic Foot Exam - Simple   Simple Foot Form Diabetic Foot exam was performed with the following findings: Yes 02/01/2023  1:06 PM  Visual Inspection No deformities, no ulcerations, no other skin breakdown bilaterally: Yes Sensation Testing Intact to touch and monofilament testing bilaterally: Yes Pulse Check Posterior Tibialis and Dorsalis pulse intact bilaterally: Yes Comments Feet are very ticklish    Results for orders placed or performed in visit on 02/01/23  POCT HgB A1C  Result Value Ref Range   Hemoglobin A1C     HbA1c POC (<> result, manual entry) 7.5 4.0 - 5.6 %   HbA1c, POC (prediabetic range)     HbA1c, POC (controlled diabetic range)       Assessment & Plan:  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  We discussed the options of recommitting to nutritional and physical activity interventions versus intensifying metformin therapy.  She would like to make changes to her diet and start exercising again before increasing medication since she knows that she has not done so as well as she should the last few months.  Recheck A1c in 3 months, if necessary can add metformin dose of 1 tablet in the morning.  Orders: -     POCT glycosylated hemoglobin (Hb A1C) -     metFORMIN HCl ER; TAKE 2 TABLETS BY MOUTH DAILY  AFTER SUPPER  Dispense: 180 tablet; Refill: 1  Hypertension associated with diabetes (HCC) Assessment & Plan: BP goal <130/80. Controlled. Continue amlodipine 5 mg and losartan 100 mg daily.    Mixed diabetic hyperlipidemia associated with type 2 diabetes mellitus (HCC) Assessment & Plan: Last lipid panel: LDL 68, HDL 69, triglycerides 71.  Continue atorvastatin 20 mg daily, heart healthy diet low in fats.  Rechecking lipid panels at next appointment.  Will continue to monitor.   CMP is stable.  Return in about 3 months (around 05/03/2023) for follow-up for HTN, HLD, DM, fasting blood work 1 week before.    [name], PA

## 2023-02-01 NOTE — Assessment & Plan Note (Signed)
BP goal <130/80. Controlled. Continue amlodipine 5 mg and losartan 100 mg daily.

## 2023-02-19 ENCOUNTER — Telehealth: Payer: Self-pay

## 2023-02-19 DIAGNOSIS — E119 Type 2 diabetes mellitus without complications: Secondary | ICD-10-CM

## 2023-02-19 MED ORDER — ONETOUCH VERIO VI STRP
ORAL_STRIP | 12 refills | Status: DC
Start: 2023-02-19 — End: 2023-03-01

## 2023-02-19 NOTE — Telephone Encounter (Signed)
Refill sent.

## 2023-02-19 NOTE — Telephone Encounter (Signed)
Prescription Request  02/19/2023  LOV: 02/01/23  What is the name of the medication or equipment? glucose blood (ONETOUCH VERIO) test strip   Have you contacted your pharmacy to request a refill? No   Which pharmacy would you like this sent to?  [id number] [place], [place] - [address]  [place] [address] Phone: [phone] Fax: [phone]   Patient notified that their request is being sent to the clinical staff for review and that they should receive a response within 2 business days.   Please advise at Mobile [phone] (mobile)

## 2023-03-01 MED ORDER — ONETOUCH VERIO W/DEVICE KIT
PACK | 0 refills | Status: DC
Start: 2023-03-01 — End: 2023-10-17

## 2023-03-01 MED ORDER — ONETOUCH VERIO VI STRP
ORAL_STRIP | 12 refills | Status: DC
Start: 2023-03-01 — End: 2023-12-31

## 2023-03-01 NOTE — Addendum Note (Signed)
Addended by: Tonny Bollman on: 03/01/2023 11:13 AM   Modules accepted: Orders

## 2023-03-01 NOTE — Telephone Encounter (Addendum)
[facility] would need to go to [facility] [place], [place] - [address]

## 2023-03-01 NOTE — Telephone Encounter (Signed)
Sent to [facility]

## 2023-04-25 ENCOUNTER — Other Ambulatory Visit: Payer: Self-pay | Admitting: Nurse Practitioner

## 2023-04-25 DIAGNOSIS — E1159 Type 2 diabetes mellitus with other circulatory complications: Secondary | ICD-10-CM

## 2023-04-25 DIAGNOSIS — E1169 Type 2 diabetes mellitus with other specified complication: Secondary | ICD-10-CM

## 2023-04-26 ENCOUNTER — Other Ambulatory Visit: Payer: Medicare Other

## 2023-04-26 MED ORDER — AMLODIPINE BESYLATE 5 MG PO TABS
5.0000 mg | ORAL_TABLET | Freq: Every day | ORAL | 2 refills | Status: DC
Start: 2023-04-26 — End: 2023-10-17

## 2023-04-26 MED ORDER — ATORVASTATIN CALCIUM 20 MG PO TABS
20.0000 mg | ORAL_TABLET | Freq: Every day | ORAL | 2 refills | Status: DC
Start: 2023-04-26 — End: 2024-01-07

## 2023-04-26 NOTE — Telephone Encounter (Signed)
Hi Rachel Camacho.  This patient has you listed as PCP.

## 2023-05-03 ENCOUNTER — Ambulatory Visit: Payer: Medicare Other | Admitting: Family Medicine

## 2023-06-06 ENCOUNTER — Other Ambulatory Visit: Payer: Medicare Other

## 2023-06-06 DIAGNOSIS — E1159 Type 2 diabetes mellitus with other circulatory complications: Secondary | ICD-10-CM | POA: Diagnosis not present

## 2023-06-06 DIAGNOSIS — E1169 Type 2 diabetes mellitus with other specified complication: Secondary | ICD-10-CM

## 2023-06-06 DIAGNOSIS — E782 Mixed hyperlipidemia: Secondary | ICD-10-CM | POA: Diagnosis not present

## 2023-06-06 DIAGNOSIS — N1831 Chronic kidney disease, stage 3a: Secondary | ICD-10-CM | POA: Diagnosis not present

## 2023-06-06 DIAGNOSIS — E0822 Diabetes mellitus due to underlying condition with diabetic chronic kidney disease: Secondary | ICD-10-CM | POA: Diagnosis not present

## 2023-06-06 DIAGNOSIS — I152 Hypertension secondary to endocrine disorders: Secondary | ICD-10-CM

## 2023-06-07 LAB — COMPREHENSIVE METABOLIC PANEL
ALT: 17 [IU]/L (ref 0–32)
AST: 21 [IU]/L (ref 0–40)
Albumin: 4.4 g/dL (ref 3.8–4.8)
Alkaline Phosphatase: 69 [IU]/L (ref 44–121)
BUN/Creatinine Ratio: 20 (ref 12–28)
BUN: 16 mg/dL (ref 8–27)
Bilirubin Total: 0.7 mg/dL (ref 0.0–1.2)
CO2: 18 mmol/L — ABNORMAL LOW (ref 20–29)
Calcium: 9 mg/dL (ref 8.7–10.3)
Chloride: 109 mmol/L — ABNORMAL HIGH (ref 96–106)
Creatinine, Ser: 0.8 mg/dL (ref 0.57–1.00)
Globulin, Total: 2.3 g/dL (ref 1.5–4.5)
Glucose: 103 mg/dL — ABNORMAL HIGH (ref 70–99)
Potassium: 4.2 mmol/L (ref 3.5–5.2)
Sodium: 143 mmol/L (ref 134–144)
Total Protein: 6.7 g/dL (ref 6.0–8.5)
eGFR: 75 mL/min/{1.73_m2} (ref >59)

## 2023-06-07 LAB — CBC WITH DIFFERENTIAL/PLATELET
Basophils Absolute: 0.1 10*3/uL (ref 0.0–0.2)
Basos: 1 %
EOS (ABSOLUTE): 0.3 10*3/uL (ref 0.0–0.4)
Eos: 5 %
Hematocrit: 37.2 % (ref 34.0–46.6)
Hemoglobin: 12.3 g/dL (ref 11.1–15.9)
Immature Grans (Abs): 0 10*3/uL (ref 0.0–0.1)
Immature Granulocytes: 0 %
Lymphocytes Absolute: 2.5 10*3/uL (ref 0.7–3.1)
Lymphs: 42 %
MCH: 28.5 pg (ref 26.6–33.0)
MCHC: 33.1 g/dL (ref 31.5–35.7)
MCV: 86 fL (ref 79–97)
Monocytes Absolute: 0.4 10*3/uL (ref 0.1–0.9)
Monocytes: 7 %
Neutrophils Absolute: 2.7 10*3/uL (ref 1.4–7.0)
Neutrophils: 45 %
Platelets: 250 10*3/uL (ref 150–450)
RBC: 4.32 x10E6/uL (ref 3.77–5.28)
RDW: 13.5 % (ref 11.7–15.4)
WBC: 5.9 10*3/uL (ref 3.4–10.8)

## 2023-06-07 LAB — LIPID PANEL
Chol/HDL Ratio: 2 {ratio} (ref 0.0–4.4)
Cholesterol, Total: 123 mg/dL (ref 100–199)
HDL: 63 mg/dL (ref >39)
LDL Chol Calc (NIH): 46 mg/dL (ref 0–99)
Triglycerides: 67 mg/dL (ref 0–149)
VLDL Cholesterol Cal: 14 mg/dL (ref 5–40)

## 2023-06-07 LAB — HEMOGLOBIN A1C
Est. average glucose Bld gHb Est-mCnc: 143 mg/dL
Hgb A1c MFr Bld: 6.6 % — ABNORMAL HIGH (ref 4.8–5.6)

## 2023-06-11 ENCOUNTER — Encounter: Payer: Self-pay | Admitting: Family Medicine

## 2023-06-11 ENCOUNTER — Ambulatory Visit (INDEPENDENT_AMBULATORY_CARE_PROVIDER_SITE_OTHER): Payer: Medicare Other | Admitting: Family Medicine

## 2023-06-11 VITALS — BP 108/68 | HR 72 | Ht 63.0 in | Wt 155.2 lb

## 2023-06-11 DIAGNOSIS — Z7984 Long term (current) use of oral hypoglycemic drugs: Secondary | ICD-10-CM | POA: Diagnosis not present

## 2023-06-11 DIAGNOSIS — Z1231 Encounter for screening mammogram for malignant neoplasm of breast: Secondary | ICD-10-CM

## 2023-06-11 DIAGNOSIS — E1159 Type 2 diabetes mellitus with other circulatory complications: Secondary | ICD-10-CM

## 2023-06-11 DIAGNOSIS — E782 Mixed hyperlipidemia: Secondary | ICD-10-CM

## 2023-06-11 DIAGNOSIS — R809 Proteinuria, unspecified: Secondary | ICD-10-CM | POA: Diagnosis not present

## 2023-06-11 DIAGNOSIS — E1169 Type 2 diabetes mellitus with other specified complication: Secondary | ICD-10-CM | POA: Diagnosis not present

## 2023-06-11 DIAGNOSIS — Z7689 Persons encountering health services in other specified circumstances: Secondary | ICD-10-CM

## 2023-06-11 DIAGNOSIS — E1129 Type 2 diabetes mellitus with other diabetic kidney complication: Secondary | ICD-10-CM | POA: Diagnosis not present

## 2023-06-11 DIAGNOSIS — N1831 Chronic kidney disease, stage 3a: Secondary | ICD-10-CM

## 2023-06-11 DIAGNOSIS — I152 Hypertension secondary to endocrine disorders: Secondary | ICD-10-CM

## 2023-06-11 LAB — POCT UA - MICROALBUMIN
Albumin/Creatinine Ratio, Urine, POC: >300
Creatinine, POC: 10 mg/dL
Microalbumin Ur, POC: 80 mg/L

## 2023-06-11 NOTE — Assessment & Plan Note (Addendum)
A1c improved significantly to 6.6.  Congratulated patient on the change in her A1c with changes in nutrition and physical activity alone.  Continue with her current habits in addition to metformin 1000 mg daily with dinner.  Will continue to monitor.  Collecting urine microalbumin today.

## 2023-06-11 NOTE — Patient Instructions (Addendum)
AT THE PHARMACY: I encourage you to consider the following preventative care options that are recommended for you.  These are covered by your insurance company because they are for the prevention of issues that can be dangerous to your health. -Shingles vaccine: 2 dose series recommended for all adults starting at age 79. -Tetanus (Tdap) booster: Recommended once every 10 years for all adults 13 and older.  BONE HEALTH: #1 calcium: total of 1200 mg of calcium daily.  If you eat a very calcium rich diet you may be able to obtain that without a supplement.  If not, then I recommend calcium 500 mg twice a day.  There are several products over-the-counter such as Caltrate D and Viactiv chews which are great options that contain calcium and vitamin D. #2 vitamin D: recommend 800 international units daily. #3 exercise: recommend 30 minutes of weightbearing exercise 3 days a week.  Resistance training ,such as doing bands and light weights, can be particularly helpful.

## 2023-06-11 NOTE — Progress Notes (Signed)
Established Patient Office Visit  Subjective   Patient ID: Rachel Camacho, female    DOB: 07/18/1945  Age: 78 y.o. MRN: [id number]  Chief Complaint  Patient presents with   Hypertension   Diabetes   Hyperlipidemia    HPI Rachel Camacho is a 78 y.o. female presenting today for follow up of hypertension, hyperlipidemia, diabetes.  She is also requesting a referral to dermatology with Dr. Langston Reusing. Hypertension:   Pt denies chest pain, SOB, dizziness, edema, syncope, fatigue or heart palpitations. Taking amlodipine and losartan, reports excellent compliance with treatment. Denies side effects. Hyperlipidemia: tolerating atorvastatin well with no myalgias or significant side effects.  The ASCVD Risk score (Arnett DK, et al., 2019) failed to calculate for the following reasons:   The valid total cholesterol range is 130 to 320 mg/dL Diabetes: denies hypoglycemic events, wounds or sores that are not healing well, increased thirst or urination. Denies vision problems, eye exam up-to-date. Taking metformin 1000 mg with dinner as prescribed without any side effects. Previously discussed the options of recommitting to nutritional and physical activity interventions versus intensifying metformin therapy.  She decided to make changes to her diet and start exercising again before increasing medication since she knows that she has not done so as well as she should the last few months.   Outpatient Medications Prior to Visit  Medication Sig   amLODipine (NORVASC) 5 MG tablet Take 1 tablet (5 mg total) by mouth daily.   atorvastatin (LIPITOR) 20 MG tablet Take 1 tablet (20 mg total) by mouth at bedtime.   Blood Glucose Monitoring Suppl (ONETOUCH VERIO) w/Device KIT Use to test glucose fating in AM and 2 hours after largest meal   Cholecalciferol (VITAMIN D-3) 5000 units TABS Take 1 tablet by mouth daily.   glucose blood (ONETOUCH VERIO) test strip USE TO CHECK FASTING GLUCOSE IN THE AM AND TO CHECK 2  HOURS AFTER LARGEST MEAL OF THE DAY.   ibuprofen (ADVIL) 600 MG tablet TAKE 1 TABLET BY MOUTH EVERY 8 HOURS AS NEEDED.   Lancets (ONETOUCH DELICA PLUS LANCET33G) MISC USE 2 LANCETS DAILY AS DIRECTED   losartan (COZAAR) 100 MG tablet Take 1 tablet (100 mg total) by mouth daily.   metFORMIN (GLUCOPHAGE-XR) 500 MG 24 hr tablet TAKE 2 TABLETS BY MOUTH DAILY  AFTER SUPPER   No facility-administered medications prior to visit.    ROS Negative unless otherwise noted in HPI   Objective:     BP 108/68   Pulse 72   Ht 5\' 3"  (1.6 m)   Wt 155 lb 4 oz (70.4 kg)   SpO2 100%   BMI 27.50 kg/m   Physical Exam Constitutional:      General: She is not in acute distress.    Appearance: Normal appearance.  HENT:     Head: Normocephalic and atraumatic.  Cardiovascular:     Rate and Rhythm: Normal rate and regular rhythm.     Heart sounds: No murmur heard.    No friction rub. No gallop.  Pulmonary:     Effort: Pulmonary effort is normal. No respiratory distress.     Breath sounds: No wheezing, rhonchi or rales.  Skin:    General: Skin is warm and dry.  Neurological:     Mental Status: She is alert and oriented to person, place, and time.    Results for orders placed or performed in visit on 06/11/23  POCT UA - Microalbumin  Result Value Ref Range   Microalbumin Ur, POC  80 mg/L   Creatinine, POC 10 mg/dL   Albumin/Creatinine Ratio, Urine, POC >300 mg/g      Assessment & Plan:  Hypertension associated with diabetes (HCC) Assessment & Plan: BP goal <130/80. Controlled. Continue amlodipine 5 mg and losartan 100 mg daily.    Mixed diabetic hyperlipidemia associated with type 2 diabetes mellitus (HCC) Assessment & Plan: Last lipid panel: LDL 46, HDL 63, triglycerides 67.  Continue atorvastatin 20 mg daily, heart healthy diet low in fats. Will continue to monitor.   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  Congratulated patient on the change in her A1c with changes in nutrition and physical activity alone.  Continue with her current habits in addition to metformin 1000 mg daily with dinner.  Will continue to monitor.  Collecting urine microalbumin today.  Orders: -     POCT UA - Microalbumin  Screening mammogram for breast cancer -     3D Screening Mammogram, Left and Right; Future  Encounter for skin care -     Ambulatory referral to Dermatology  Microalbuminuria due to type 2 diabetes mellitus (HCC) Assessment & Plan: On ARB therapy.  Microalbumin elevated >300 mg/g again today, previously 30-300 in October 2023.  Patient has demonstrated improved glycemic control within the past few months.  Recheck urine microalbumin at follow-up appointment.  May need to intensify glycemic control due to albumin in the urine more so than A1c goals.   Recommended updating tetanus and shingles vaccines at the pharmacy.  Return in about 4 months (around 10/09/2023) for follow-up for HTN, HLD, DM, fasting labs 1 week before.    [name], PA

## 2023-06-11 NOTE — Assessment & Plan Note (Signed)
Last lipid panel: LDL 46, HDL 63, triglycerides 67.  Continue atorvastatin 20 mg daily, heart healthy diet low in fats. Will continue to monitor.

## 2023-06-11 NOTE — Assessment & Plan Note (Signed)
BP goal <130/80. Controlled. Continue amlodipine 5 mg and losartan 100 mg daily.

## 2023-06-11 NOTE — Assessment & Plan Note (Signed)
On ARB therapy.  Microalbumin elevated >300 mg/g again today, previously 30-300 in October 2023.  Patient has demonstrated improved glycemic control within the past few months.  Recheck urine microalbumin at follow-up appointment.  May need to intensify glycemic control due to albumin in the urine more so than A1c goals.

## 2023-06-28 ENCOUNTER — Encounter: Payer: Self-pay | Admitting: Family Medicine

## 2023-07-05 ENCOUNTER — Encounter: Payer: Self-pay | Admitting: Family Medicine

## 2023-07-05 NOTE — Telephone Encounter (Signed)
Informed pt  that she can call around and if she gets an office that can see her faster to let us know and we can send the referral to them.

## 2023-07-11 ENCOUNTER — Ambulatory Visit: Payer: Medicare Other

## 2023-07-20 ENCOUNTER — Ambulatory Visit
Admission: RE | Admit: 2023-07-20 | Discharge: 2023-07-20 | Disposition: A | Discharge status: Home / Self Care | Payer: Medicare Other | Source: Ambulatory Visit | Attending: Family Medicine | Admitting: Family Medicine

## 2023-07-20 DIAGNOSIS — Z1231 Encounter for screening mammogram for malignant neoplasm of breast: Secondary | ICD-10-CM

## 2023-07-25 ENCOUNTER — Encounter: Payer: Self-pay | Admitting: Family Medicine

## 2023-07-30 DIAGNOSIS — H25013 Cortical age-related cataract, bilateral: Secondary | ICD-10-CM | POA: Diagnosis not present

## 2023-07-30 DIAGNOSIS — E119 Type 2 diabetes mellitus without complications: Secondary | ICD-10-CM | POA: Diagnosis not present

## 2023-07-30 DIAGNOSIS — H524 Presbyopia: Secondary | ICD-10-CM | POA: Diagnosis not present

## 2023-07-30 LAB — HM DIABETES EYE EXAM

## 2023-09-17 DIAGNOSIS — Z860101 Personal history of adenomatous and serrated colon polyps: Secondary | ICD-10-CM | POA: Diagnosis not present

## 2023-09-23 ENCOUNTER — Other Ambulatory Visit: Payer: Self-pay | Admitting: Family Medicine

## 2023-09-23 DIAGNOSIS — E0822 Diabetes mellitus due to underlying condition with diabetic chronic kidney disease: Secondary | ICD-10-CM

## 2023-10-01 ENCOUNTER — Other Ambulatory Visit: Payer: Self-pay | Admitting: *Deleted

## 2023-10-01 DIAGNOSIS — E1169 Type 2 diabetes mellitus with other specified complication: Secondary | ICD-10-CM

## 2023-10-01 DIAGNOSIS — I152 Hypertension secondary to endocrine disorders: Secondary | ICD-10-CM

## 2023-10-01 DIAGNOSIS — E559 Vitamin D deficiency, unspecified: Secondary | ICD-10-CM

## 2023-10-02 ENCOUNTER — Other Ambulatory Visit: Payer: Medicare Other

## 2023-10-02 DIAGNOSIS — E1169 Type 2 diabetes mellitus with other specified complication: Secondary | ICD-10-CM

## 2023-10-02 DIAGNOSIS — I152 Hypertension secondary to endocrine disorders: Secondary | ICD-10-CM

## 2023-10-02 DIAGNOSIS — E559 Vitamin D deficiency, unspecified: Secondary | ICD-10-CM

## 2023-10-03 ENCOUNTER — Other Ambulatory Visit

## 2023-10-09 ENCOUNTER — Ambulatory Visit: Payer: Medicare Other | Admitting: Family Medicine

## 2023-10-11 DIAGNOSIS — E1169 Type 2 diabetes mellitus with other specified complication: Secondary | ICD-10-CM | POA: Diagnosis not present

## 2023-10-11 DIAGNOSIS — I152 Hypertension secondary to endocrine disorders: Secondary | ICD-10-CM | POA: Diagnosis not present

## 2023-10-11 DIAGNOSIS — E782 Mixed hyperlipidemia: Secondary | ICD-10-CM | POA: Diagnosis not present

## 2023-10-11 DIAGNOSIS — E1159 Type 2 diabetes mellitus with other circulatory complications: Secondary | ICD-10-CM | POA: Diagnosis not present

## 2023-10-11 DIAGNOSIS — E559 Vitamin D deficiency, unspecified: Secondary | ICD-10-CM | POA: Diagnosis not present

## 2023-10-12 LAB — HEMOGLOBIN A1C
Est. average glucose Bld gHb Est-mCnc: 143 mg/dL
Hgb A1c MFr Bld: 6.6 % — ABNORMAL HIGH (ref 4.8–5.6)

## 2023-10-12 LAB — COMPREHENSIVE METABOLIC PANEL WITH GFR
ALT: 24 IU/L (ref 0–32)
AST: 28 IU/L (ref 0–40)
Albumin: 4.8 g/dL (ref 3.8–4.8)
Alkaline Phosphatase: 84 IU/L (ref 44–121)
BUN/Creatinine Ratio: 16 (ref 12–28)
BUN: 11 mg/dL (ref 8–27)
Bilirubin Total: 1.1 mg/dL (ref 0.0–1.2)
CO2: 20 mmol/L (ref 20–29)
Calcium: 9.7 mg/dL (ref 8.7–10.3)
Chloride: 101 mmol/L (ref 96–106)
Creatinine, Ser: 0.67 mg/dL (ref 0.57–1.00)
Globulin, Total: 2.8 g/dL (ref 1.5–4.5)
Glucose: 99 mg/dL (ref 70–99)
Potassium: 4.5 mmol/L (ref 3.5–5.2)
Sodium: 139 mmol/L (ref 134–144)
Total Protein: 7.6 g/dL (ref 6.0–8.5)
eGFR: 89 mL/min/{1.73_m2} (ref >59)

## 2023-10-12 LAB — CBC WITH DIFFERENTIAL/PLATELET
Basophils Absolute: 0.1 10*3/uL (ref 0.0–0.2)
Basos: 1 %
EOS (ABSOLUTE): 0.2 10*3/uL (ref 0.0–0.4)
Eos: 3 %
Hematocrit: 42.7 % (ref 34.0–46.6)
Hemoglobin: 13.7 g/dL (ref 11.1–15.9)
Immature Grans (Abs): 0 10*3/uL (ref 0.0–0.1)
Immature Granulocytes: 0 %
Lymphocytes Absolute: 2.5 10*3/uL (ref 0.7–3.1)
Lymphs: 35 %
MCH: 28.1 pg (ref 26.6–33.0)
MCHC: 32.1 g/dL (ref 31.5–35.7)
MCV: 88 fL (ref 79–97)
Monocytes Absolute: 0.4 10*3/uL (ref 0.1–0.9)
Monocytes: 6 %
Neutrophils Absolute: 4.1 10*3/uL (ref 1.4–7.0)
Neutrophils: 55 %
Platelets: 290 10*3/uL (ref 150–450)
RBC: 4.88 x10E6/uL (ref 3.77–5.28)
RDW: 13.4 % (ref 11.7–15.4)
WBC: 7.3 10*3/uL (ref 3.4–10.8)

## 2023-10-12 LAB — LIPID PANEL
Chol/HDL Ratio: 1.9 ratio (ref 0.0–4.4)
Cholesterol, Total: 128 mg/dL (ref 100–199)
HDL: 67 mg/dL (ref >39)
LDL Chol Calc (NIH): 44 mg/dL (ref 0–99)
Triglycerides: 92 mg/dL (ref 0–149)
VLDL Cholesterol Cal: 17 mg/dL (ref 5–40)

## 2023-10-12 LAB — VITAMIN D 25 HYDROXY (VIT D DEFICIENCY, FRACTURES): Vit D, 25-Hydroxy: 93.5 ng/mL (ref 30.0–100.0)

## 2023-10-12 LAB — TSH: TSH: 1.33 u[IU]/mL (ref 0.450–4.500)

## 2023-10-15 ENCOUNTER — Ambulatory Visit: Payer: Self-pay | Admitting: Family Medicine

## 2023-10-16 ENCOUNTER — Other Ambulatory Visit: Payer: Self-pay | Admitting: Family Medicine

## 2023-10-16 DIAGNOSIS — E119 Type 2 diabetes mellitus without complications: Secondary | ICD-10-CM

## 2023-10-17 ENCOUNTER — Encounter: Payer: Self-pay | Admitting: Family Medicine

## 2023-10-17 ENCOUNTER — Ambulatory Visit (INDEPENDENT_AMBULATORY_CARE_PROVIDER_SITE_OTHER): Admitting: Family Medicine

## 2023-10-17 ENCOUNTER — Ambulatory Visit: Payer: Self-pay

## 2023-10-17 VITALS — BP 114/74 | HR 83 | Temp 98.1°F | Ht 63.0 in | Wt 151.0 lb

## 2023-10-17 DIAGNOSIS — E1159 Type 2 diabetes mellitus with other circulatory complications: Secondary | ICD-10-CM

## 2023-10-17 DIAGNOSIS — E1169 Type 2 diabetes mellitus with other specified complication: Secondary | ICD-10-CM | POA: Diagnosis not present

## 2023-10-17 DIAGNOSIS — E782 Mixed hyperlipidemia: Secondary | ICD-10-CM

## 2023-10-17 DIAGNOSIS — I152 Hypertension secondary to endocrine disorders: Secondary | ICD-10-CM

## 2023-10-17 DIAGNOSIS — R809 Proteinuria, unspecified: Secondary | ICD-10-CM

## 2023-10-17 DIAGNOSIS — E559 Vitamin D deficiency, unspecified: Secondary | ICD-10-CM

## 2023-10-17 DIAGNOSIS — R42 Dizziness and giddiness: Secondary | ICD-10-CM | POA: Insufficient documentation

## 2023-10-17 DIAGNOSIS — E1129 Type 2 diabetes mellitus with other diabetic kidney complication: Secondary | ICD-10-CM

## 2023-10-17 DIAGNOSIS — N1831 Chronic kidney disease, stage 3a: Secondary | ICD-10-CM

## 2023-10-17 DIAGNOSIS — Z7984 Long term (current) use of oral hypoglycemic drugs: Secondary | ICD-10-CM

## 2023-10-17 DIAGNOSIS — M85851 Other specified disorders of bone density and structure, right thigh: Secondary | ICD-10-CM

## 2023-10-17 DIAGNOSIS — E0822 Diabetes mellitus due to underlying condition with diabetic chronic kidney disease: Secondary | ICD-10-CM

## 2023-10-17 LAB — GLUCOSE, POCT (MANUAL RESULT ENTRY): POC Glucose: 172 mg/dL — AB (ref 70–99)

## 2023-10-17 NOTE — Assessment & Plan Note (Addendum)
 On ARB therapy.  A1c remains at 6.6. GFR last week was greatly improved from 75 to 89.  Recheck urine microalbumin at follow-up appointment.

## 2023-10-17 NOTE — Assessment & Plan Note (Signed)
 BP goal <130/80. Controlled. Concern that now that she has lost some weight, her blood pressure may be dropping too low and contributing to her dizziness. Will discontinue her amlodipine  5 mg. Continue  losartan  100 mg daily. Pt instructed to check BP at home over the next two weeks following this medication change and send me a MyChart message to update me on her readings.

## 2023-10-17 NOTE — Assessment & Plan Note (Signed)
 Last lipid panel: LDL 44, HDL 67, triglycerides 92.  Continue atorvastatin  20 mg daily, heart healthy diet low in fats. Will continue to monitor.

## 2023-10-17 NOTE — Assessment & Plan Note (Addendum)
 Given patient's description of symptoms, suspect the dizziness is due to orthostatic hypotension. Orthostatic hypotension not demonstrated in office today. Plan to discontinue the amlodipine  and reassess her BP and symptoms in 2 weeks through MyChart. Encouraged patient to elevate feet when possible and remain mindful to take it slow when she transitions from sitting to standing. Encouraged her to increase her daily fluid intake.

## 2023-10-17 NOTE — Telephone Encounter (Signed)
 Copied from CRM [id number]. Topic: Clinical - Red Word Triage >> Oct 17, 2023  1:50 PM Rachel Camacho wrote: Red Word that prompted transfer to Nurse Triage: The patient states she was recently diagnosed with vitamin d  deficiency. She states she has been taking vitamin d  and eating everything that she knows has vitamin d  in it. However, she is lightheaded and experiencing extreme fatigue.   Chief Complaint: Fatigue Symptoms: Lightheadedness Frequency: Acute  Pertinent Negatives: Patient denies chest pain, dyspnea, or pallor  Disposition: [] ED /[] Urgent Care (no appt availability in office) / [x] Appointment(In office/virtual)/ []  [facility]/ [] Home Care/ [] Refused Recommended Disposition /[] [facility]/ []  Follow-up with PCP  Additional Notes: Rachel Camacho is being triaged for fatigue and lightheadedness. The patient reports that she has not been out in the sun for a couple of days and has started to experience fatigue and lightheadedness that is worse than normal. The patient believes her Vitamin D  levels are the cause of her symptoms, and is currently taking 5000 international units of Vitamin D .   Reason for Disposition  [1] MODERATE weakness (i.e., interferes with work, school, normal activities) AND [2] persists > 3 days  Answer Assessment - Initial Assessment Questions 1. DESCRIPTION: "Describe how you are feeling."     Weakness, Fatigue  2. SEVERITY: "How bad is it?"  "Can you stand and walk?"   - MILD (0-3): Feels weak or tired, but does not interfere with work, school or normal activities.   - MODERATE (4-7): Able to stand and walk; weakness interferes with work, school, or normal activities.   - SEVERE (8-10): Unable to stand or walk; unable to do usual activities.     Moderate  3. ONSET: "When did these symptoms begin?" (e.g., hours, days, weeks, months)     This week  4. CAUSE: "What do you think is causing the weakness or fatigue?" (e.g., not drinking enough fluids,  medical problem, trouble sleeping)     Vitamin D  Levels  5. NEW MEDICINES:  "Have you started on any new medicines recently?" (e.g., opioid pain medicines, benzodiazepines, muscle relaxants, antidepressants, antihistamines, neuroleptics, beta blockers)     Vitamin D  5000 IU 6. OTHER SYMPTOMS: "Do you have any other symptoms?" (e.g., chest pain, fever, cough, SOB, vomiting, diarrhea, bleeding, other areas of pain)     Lightheadedness  7. PREGNANCY: "Is there any chance you are pregnant?" "When was your last menstrual period?"     No and No  Protocols used: Weakness (Generalized) and Fatigue-A-AH

## 2023-10-17 NOTE — Progress Notes (Signed)
 Established Patient Office Visit  Subjective   Patient ID: Rachel Camacho, female    DOB: 04/16/1946  Age: 78 y.o. MRN: [id number]  Chief Complaint  Patient presents with  . Acute Visit    Dizziness     HPI  Rachel Camacho is a 78 y.o. female who presents to the clinic today for follow-up on HTN, HLD,     ROS Per HPI.    Objective:     BP 114/74   Pulse 83   Temp 98.1 F (36.7 C) (Oral)   Ht 5\' 3"  (1.6 m)   Wt 151 lb (68.5 kg)   SpO2 97%   BMI 26.75 kg/m    Physical Exam   No results found for any visits on 10/17/23.    The ASCVD Risk score (Arnett DK, et al., 2019) failed to calculate for the following reasons:   The valid total cholesterol range is 130 to 320 mg/dL    Assessment & Plan:   Dizziness -     POCT glucose (manual entry)    Assessment and Plan              No follow-ups on file.    Odilia Bennett, PA-C

## 2023-10-17 NOTE — Patient Instructions (Addendum)
 It was nice to see you today!  As we discussed in clinic today:   -Stop taking your Amlodipine  5 mg at night for your blood pressure.  -I am sending you home with a blood pressure log. Please record these at home blood pressures and send me a MyChart message in 2 weeks letting me know what it is at home. -Since you have lost weight, I think this extra support for your blood pressure is causing it to drop too low when you stand, which can be contributing to your symptoms of dizziness and fatigue.  -Continue all other medications as normal -I will place the referral for your DEXA scan and they will call you to schedule it. -Allow your body to rest over the next few days and elevate your feet when possible. Remember to be conscious of how fast you go from sitting to standing and try to take it slow. -Continue to drink plenty of fluids out the day.  If you have any problems before your next visit feel free to message me via MyChart (minor issues or questions) or call the office, otherwise you may reach out to schedule an office visit.  Thank you! Meryl Acosta, PA-C

## 2023-10-17 NOTE — Assessment & Plan Note (Addendum)
 A1c improved significantly to 6.6.  GFR last week was greatly improved from 75 to 89. Continue checking BG at home. Continue with her current habits in addition to metformin  1000 mg daily with dinner.  Will continue to monitor.

## 2023-10-17 NOTE — Progress Notes (Signed)
 Established Patient Office Visit  Subjective   Patient ID: Rachel Camacho, female    DOB: 1945/11/07  Age: 78 y.o. MRN: [id number]  Chief Complaint  Patient presents with   Acute Visit    Dizziness     HPI  Rachel Camacho is a very pleasant 78 y.o. female who presents to the clinic today for follow-up on HTN, HLD, DM.  Patient also reports that for the last 3 months, she has felt dizzy and fatigued. Reports that her symptoms have worsened in the last week, which is why she wanted to be seen earlier than her appointment next week. She describes the dizziness as most bothersome when she stands up. Denies vision or hearing changes. Denies falls. Denies sensation that room is spinning. Also reports fatigue in the sense that she does not want to get out of bed in the mornings and wants to "lay around all day". She says that this is very different from her baseline. Denies sick contacts or recent travel. Patient feels that she sleeps well at night but still wakes up tired. Declines sleep study. Denies changes in medications.   HTN - On losartan  100 mg daily and amlodipine  5 mg daily. Reports compliance with medication. Checks BP at home a few times weekly and reports readings of 110-120's/70's80's.   HLD - Currently on atorvastatin  20 mg daily. Compliant with medication. Denies myalgias.   DM - On metformin  XR 1000 mg daily. Checks her blood sugar at home but reports that her glucometer broke 3 days ago so she has not been able to check it since then. Most recent A1c 6.6. Denies hypoglycemia events, wounds, or sores that are not healing. Denies polydipsia/poluria.    ROS Per HPI.    Objective:      BP 114/74   Pulse 83   Temp 98.1 F (36.7 C) (Oral)   Ht 5\' 3"  (1.6 m)   Wt 151 lb (68.5 kg)   SpO2 97%   BMI 26.75 kg/m    Physical Exam Constitutional:      General: She is not in acute distress.    Appearance: Normal appearance.  Eyes:     Extraocular Movements:     Right eye:  Normal extraocular motion and no nystagmus.     Left eye: Normal extraocular motion and no nystagmus.  Cardiovascular:     Rate and Rhythm: Normal rate and regular rhythm.     Heart sounds: Normal heart sounds. No murmur heard.    No friction rub. No gallop.  Pulmonary:     Effort: Pulmonary effort is normal. No respiratory distress.     Breath sounds: Normal breath sounds.  Musculoskeletal:        General: No swelling.  Skin:    General: Skin is warm and dry.  Neurological:     General: No focal deficit present.     Mental Status: She is alert.     Cranial Nerves: Cranial nerves 2-12 are intact. No cranial nerve deficit or facial asymmetry.     Coordination: Coordination is intact.  Psychiatric:        Mood and Affect: Mood normal.        Behavior: Behavior normal.        Thought Content: Thought content normal.      Results for orders placed or performed in visit on 10/17/23  POCT Glucose (CBG)  Result Value Ref Range   POC Glucose 172 (A) 70 - 99 mg/dl  The ASCVD Risk score (Arnett DK, et al., 2019) failed to calculate for the following reasons:   The valid total cholesterol range is 130 to 320 mg/dL    Assessment & Plan:   Dizziness Assessment & Plan: Given patient's description of symptoms, suspect the dizziness is due to orthostatic hypotension. Orthostatic hypotension not demonstrated in office today. Plan to discontinue the amlodipine  and reassess her BP and symptoms in 2 weeks through MyChart. Encouraged patient to elevate feet when possible and remain mindful to take it slow when she transitions from sitting to standing. Encouraged her to increase her daily fluid intake.   Orders: -     POCT glucose (manual entry)  Vitamin D  deficiency  Osteopenia of neck of right femur -     DG Bone Density; Future  Hypertension associated with diabetes (HCC) Assessment & Plan: BP goal <130/80. Controlled. Concern that now that she has lost some weight, her blood  pressure may be dropping too low and contributing to her dizziness. Will discontinue her amlodipine  5 mg. Continue  losartan  100 mg daily. Pt instructed to check BP at home over the next two weeks following this medication change and send me a MyChart message to update me on her readings.   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  GFR last week was greatly improved from 75 to 89. Continue checking BG at home. Continue with her current habits in addition to metformin  1000 mg daily with dinner.  Will continue to monitor.    Microalbuminuria due to type 2 diabetes mellitus [facility]) Assessment & Plan: On ARB therapy.  A1c remains at 6.6. GFR last week was greatly improved from 75 to 89.  Recheck urine microalbumin at follow-up appointment.    Mixed diabetic hyperlipidemia associated with type 2 diabetes mellitus (HCC) Assessment & Plan: Last lipid panel: LDL 44, HDL 67, triglycerides 92.  Continue atorvastatin  20 mg daily, heart healthy diet low in fats. Will continue to monitor.      Assessment and Plan              Return in about 6 months (around 04/18/2024) for HTN, DM, HLD, fatigue.    Odilia Bennett, PA-C

## 2023-11-01 ENCOUNTER — Telehealth: Payer: Self-pay | Admitting: *Deleted

## 2023-11-01 ENCOUNTER — Ambulatory Visit: Admitting: Family Medicine

## 2023-11-07 NOTE — Telephone Encounter (Signed)
 Mychart message sent.

## 2023-12-11 LAB — HM COLONOSCOPY

## 2023-12-27 ENCOUNTER — Ambulatory Visit: Payer: Medicare Other

## 2023-12-27 ENCOUNTER — Other Ambulatory Visit: Payer: Self-pay | Admitting: Family Medicine

## 2023-12-27 DIAGNOSIS — Z0000 Encounter for general adult medical examination without abnormal findings: Secondary | ICD-10-CM

## 2023-12-27 DIAGNOSIS — E1159 Type 2 diabetes mellitus with other circulatory complications: Secondary | ICD-10-CM

## 2023-12-27 NOTE — Patient Instructions (Signed)
 Rachel Camacho , Thank you for taking time out of your busy schedule to complete your Annual Wellness Visit with me. I enjoyed our conversation and look forward to speaking with you again next year. I, as well as your care team,  appreciate your ongoing commitment to your health goals. Please review the following plan we discussed and let me know if I can assist you in the future. Your Game plan/ To Do List    Referrals: If you haven't heard from the office you've been referred to, please reach out to them at the phone provided.   Follow up Visits: We will see or speak with you next year for your Next Medicare AWV with our clinical staff Have you seen your provider in the last 6 months (3 months if uncontrolled diabetes)? Yes  Clinician Recommendations:  Aim for 30 minutes of exercise or brisk walking, 6-8 glasses of water, and 5 servings of fruits and vegetables each day.       This is a list of the screenings recommended for you:  Health Maintenance  Topic Date Due   DTaP/Tdap/Td vaccine (1 - Tdap) Never done   Zoster (Shingles) Vaccine (1 of 2) Never done   COVID-19 Vaccine (5 - 2024-25 season) 12/04/2023   Flu Shot  12/21/2023   Yearly kidney function blood test for diabetes  01/11/2024   Complete foot exam   02/01/2024   Hemoglobin A1C  04/12/2024   Yearly kidney health urinalysis for diabetes  06/10/2024   Eye exam for diabetics  07/29/2024   Medicare Annual Wellness Visit  12/26/2024   Pneumococcal Vaccine for age over 4  Completed   DEXA scan (bone density measurement)  Completed   Hepatitis C Screening  Completed   Hepatitis B Vaccine  Aged Out   HPV Vaccine  Aged Out   Meningitis B Vaccine  Aged Out   Colon Cancer Screening  Discontinued    Advanced directives: (ACP Link)Information on Advanced Care Planning can be found at [place]  Secretary of [facility] Advance Health Care Directives Advance Health Care Directives. [email or web address]  Advance Care Planning is important because  it:  [x]  Makes sure you receive the medical care that is consistent with your values, goals, and preferences  [x]  It provides guidance to your family and loved ones and reduces their decisional burden about whether or not they are making the right decisions based on your wishes.  Follow the link provided in your after visit summary or read over the paperwork we have mailed to you to help you started getting your Advance Directives in place. If you need assistance in completing these, please reach out to us  so that we can help you!  See attachments for Preventive Care and Fall Prevention Tips.

## 2023-12-27 NOTE — Progress Notes (Signed)
 Subjective:   Rachel Camacho is a 78 y.o. who presents for a Medicare Wellness preventive visit.  As a reminder, Annual Wellness Visits don't include a physical exam, and some assessments may be limited, especially if this visit is performed virtually. We may recommend an in-person follow-up visit with your provider if needed.  Visit Complete: Virtual I connected with  Kaoir Mazzeo on 12/27/23 by a audio enabled telemedicine application and verified that I am speaking with the correct person using two identifiers.  Patient Location: Home  Provider Location: Home Office  I discussed the limitations of evaluation and management by telemedicine. The patient expressed understanding and agreed to proceed.  Vital Signs: Because this visit was a virtual/telehealth visit, some criteria may be missing or patient reported. Any vitals not documented were not able to be obtained and vitals that have been documented are patient reported.  VideoError- Librarian, academic were attempted between this provider and patient, however failed, due to patient having technical difficulties OR patient did not have access to video capability.  We continued and completed visit with audio only.   Persons Participating in Visit: Patient.  AWV Questionnaire: No: Patient Medicare AWV questionnaire was not completed prior to this visit.  Cardiac Risk Factors include: advanced age (>41men, >38 women);diabetes mellitus;dyslipidemia;hypertension     Objective:    Today's Vitals   There is no height or weight on file to calculate BMI.     12/27/2023   10:16 AM 12/19/2022    1:30 PM  Advanced Directives  Does Patient Have a Medical Advance Directive? No No  Would patient like information on creating a medical advance directive?  No - Patient declined    Current Medications (verified) Outpatient Encounter Medications as of 12/27/2023  Medication Sig   atorvastatin  (LIPITOR) 20 MG tablet  Take 1 tablet (20 mg total) by mouth at bedtime.   Blood Glucose Monitoring Suppl (ONETOUCH VERIO FLEX SYSTEM) w/Device KIT USE TO TEST GLUCOSE FASTING IN  IN THE MORNING AND 2 HOURS AFTER LARGEST MEAL   Cholecalciferol (VITAMIN D -3) 5000 units TABS Take 1 tablet by mouth daily.   glucose blood (ONETOUCH VERIO) test strip USE TO CHECK FASTING GLUCOSE IN THE AM AND TO CHECK 2 HOURS AFTER LARGEST MEAL OF THE DAY.   ibuprofen  (ADVIL ) 600 MG tablet TAKE 1 TABLET BY MOUTH EVERY 8 HOURS AS NEEDED.   Lancets (ONETOUCH DELICA PLUS LANCET33G) MISC USE 2 LANCETS DAILY AS DIRECTED   metFORMIN  (GLUCOPHAGE -XR) 500 MG 24 hr tablet TAKE 2 TABLETS BY MOUTH DAILY AFTER SUPPER   [DISCONTINUED] losartan  (COZAAR ) 100 MG tablet Take 1 tablet (100 mg total) by mouth daily.   No facility-administered encounter medications on file as of 12/27/2023.    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 Intial UPC 4.7 grams. Conservative Rx. 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 Betsey)   History reviewed. No pertinent surgical history. History reviewed. No pertinent family history. Social History   Socioeconomic History   Marital status: Divorced    Spouse name: Not on file   Number of children: Not on file   Years of education: Not on file   Highest education level: Not on file  Occupational History   Not on file  Tobacco Use  Smoking status: Never    Passive exposure: Never   Smokeless tobacco: Never  Vaping Use   Vaping status: Never Used  Substance and Sexual Activity   Alcohol use: Yes    Comment: social   Drug use: No   Sexual activity: Not Currently    Birth control/protection: None  Other Topics Concern   Not on file  Social History Narrative   Not on file   Social  Drivers of Health   Financial Resource Strain: Low Risk  (12/27/2023)   Overall Financial Resource Strain (CARDIA)    Difficulty of Paying Living Expenses: Not hard at all  Food Insecurity: No Food Insecurity (12/27/2023)   Hunger Vital Sign    Worried About Running Out of Food in the Last Year: Never true    Ran Out of Food in the Last Year: Never true  Transportation Needs: No Transportation Needs (12/27/2023)   PRAPARE - Administrator, Civil Service (Medical): No    Lack of Transportation (Non-Medical): No  Physical Activity: Inactive (12/27/2023)   Exercise Vital Sign    Days of Exercise per Week: 0 days    Minutes of Exercise per Session: 0 min  Stress: No Stress Concern Present (12/27/2023)   [employer] of Occupational Health - Occupational Stress Questionnaire    Feeling of Stress: Not at all  Social Connections: Moderately Isolated (12/27/2023)   Social Connection and Isolation Panel    Frequency of Communication with Friends and Family: More than three times a week    Frequency of Social Gatherings with Friends and Family: Once a week    Attends Religious Services: 1 to 4 times per year    Active Member of [employer] or Organizations: No    Attends Engineer, structural: Never    Marital Status: Divorced    Tobacco Counseling Counseling given: Not Answered    Clinical Intake:  Pre-visit preparation completed: Yes  Pain : No/denies pain     Nutritional Risks: Nausea/ vomitting/ diarrhea (diarrhea two weeks ago, resolved) Diabetes: Yes CBG done?: No Did pt. bring in CBG monitor from home?: No  Lab Results  Component Value Date   HGBA1C 6.6 (H) 10/11/2023   HGBA1C 6.6 (H) 06/06/2023   HGBA1C 7.5 02/01/2023     How often do you need to have someone help you when you read instructions, pamphlets, or other written materials from your doctor or pharmacy?: 1 - Never  Interpreter Needed?: No  Information entered by :: NAllen LPN   Activities of  Daily Living     12/27/2023   10:12 AM  In your present state of health, do you have any difficulty performing the following activities:  Hearing? 0  Vision? 0  Difficulty concentrating or making decisions? 0  Walking or climbing stairs? 0  Dressing or bathing? 0  Doing errands, shopping? 0  Preparing Food and eating ? N  Using the Toilet? N  In the past six months, have you accidently leaked urine? N  Do you have problems with loss of bowel control? N  Managing your Medications? N  Managing your Finances? N  Housekeeping or managing your Housekeeping? N    Patient Care Team: Gayle Saddie JULIANNA DEVONNA as PCP - General (Physician Assistant) Betsey Channel, MD as Consulting Physician (Nephrology)  I have updated your Care Teams any recent Medical Services you may have received from other providers in the past year.     Assessment:   This is a routine  wellness examination for Whitni.  Hearing/Vision screen Hearing Screening - Comments:: Denies hearing issues Vision Screening - Comments:: Regular eye exams, [facility]   Goals Addressed             This Visit's Progress    Patient Stated       12/27/2023, start exercising again       Depression Screen     12/27/2023   10:17 AM 06/11/2023    3:27 PM 12/19/2022    1:28 PM 12/06/2022    3:03 PM 07/03/2022   11:25 AM 02/28/2022    2:15 PM 10/06/2021   11:09 AM  PHQ 2/9 Scores  PHQ - 2 Score 0 0 0 0 0 1 1  PHQ- 9 Score 0 0 0 2 0 3 2    Fall Risk     12/27/2023   10:17 AM 06/11/2023    3:26 PM 12/19/2022    1:29 PM 09/30/2022    9:25 PM 07/03/2022   11:25 AM  Fall Risk   Falls in the past year? 0 0 0 0 0  Number falls in past yr: 0 0 0 0 0  Injury with Fall? 0 0 0 0 0  Risk for fall due to : Medication side effect No Fall Risks No Fall Risks    Follow up Falls evaluation completed;Falls prevention discussed Falls evaluation completed Falls prevention discussed      MEDICARE RISK AT HOME:  Medicare Risk at Home Any  stairs in or around the home?: Yes If so, are there any without handrails?: No Home free of loose throw rugs in walkways, pet beds, electrical cords, etc?: Yes Adequate lighting in your home to reduce risk of falls?: Yes Life alert?: No Use of a cane, walker or w/c?: No Grab bars in the bathroom?: No Shower chair or bench in shower?: No Elevated toilet seat or a handicapped toilet?: No  TIMED UP AND GO:  Was the test performed?  No  Cognitive Function: 6CIT completed        12/27/2023   10:19 AM 12/19/2022    1:30 PM 08/27/2020   10:25 AM 08/27/2019   10:49 AM  6CIT Screen  What Year? 0 points 0 points 0 points 0 points  What month? 0 points 0 points 0 points 0 points  What time? 0 points 0 points 0 points 0 points  Count back from 20 0 points 0 points 0 points 0 points  Months in reverse 0 points 0 points 0 points 0 points  Repeat phrase 0 points 4 points 0 points 0 points  Total Score 0 points 4 points 0 points 0 points    Immunizations Immunization History  Administered Date(s) Administered   Influenza Inj Mdck Quad With Preservative 02/28/2018   Influenza, High Dose Seasonal PF 05/08/2019   Influenza, Seasonal, Injecte, Preservative Fre 03/21/2017   Influenza-Unspecified 06/06/2023   PFIZER(Purple Top)SARS-COV-2 Vaccination 07/20/2019, 08/19/2019, 10/07/2020   PPD Test 12/31/2017   Pfizer(Comirnaty)Fall Seasonal Vaccine 12 years and older 06/06/2023   Pneumococcal Conjugate-13 10/04/2017   Pneumococcal Polysaccharide-23 04/09/2018   RSV,unspecified 08/03/2023    Screening Tests Health Maintenance  Topic Date Due   DTaP/Tdap/Td (1 - Tdap) Never done   Zoster Vaccines- Shingrix  (1 of 2) Never done   COVID-19 Vaccine (5 - 2024-25 season) 12/04/2023   INFLUENZA VACCINE  12/21/2023   Diabetic kidney evaluation - eGFR measurement  01/11/2024   FOOT EXAM  02/01/2024   HEMOGLOBIN A1C  04/12/2024   Diabetic  kidney evaluation - Urine ACR  06/10/2024   OPHTHALMOLOGY EXAM   07/29/2024   Medicare Annual Wellness (AWV)  12/26/2024   Pneumococcal Vaccine: 50+ Years  Completed   DEXA SCAN  Completed   Hepatitis C Screening  Completed   Hepatitis B Vaccines  Aged Out   HPV VACCINES  Aged Out   Meningococcal B Vaccine  Aged Out   Colonoscopy  Discontinued    Health Maintenance  Health Maintenance Due  Topic Date Due   DTaP/Tdap/Td (1 - Tdap) Never done   Zoster Vaccines- Shingrix  (1 of 2) Never done   COVID-19 Vaccine (5 - 2024-25 season) 12/04/2023   INFLUENZA VACCINE  12/21/2023   Diabetic kidney evaluation - eGFR measurement  01/11/2024   Health Maintenance Items Addressed: Due for flu, covid, TDAP and shingles vaccine.  Additional Screening:  Vision Screening: Recommended annual ophthalmology exams for early detection of glaucoma and other disorders of the eye. Would you like a referral to an eye doctor? No    Dental Screening: Recommended annual dental exams for proper oral hygiene  Community Resource Referral / Chronic Care Management: CRR required this visit?  No   CCM required this visit?  No   Plan:    I have personally reviewed and noted the following in the patient's chart:   Medical and social history Use of alcohol, tobacco or illicit drugs  Current medications and supplements including opioid prescriptions. Patient is not currently taking opioid prescriptions. Functional ability and status Nutritional status Physical activity Advanced directives List of other physicians Hospitalizations, surgeries, and ER visits in previous 12 months Vitals Screenings to include cognitive, depression, and falls Referrals and appointments  In addition, I have reviewed and discussed with patient certain preventive protocols, quality metrics, and best practice recommendations. A written personalized care plan for preventive services as well as general preventive health recommendations were provided to patient.   Ardella FORBES Dawn,  LPN   05/23/7972   After Visit Summary: (MyChart) Due to this being a telephonic visit, the after visit summary with patients personalized plan was offered to patient via MyChart   Notes: Nothing significant to report at this time.

## 2023-12-31 ENCOUNTER — Other Ambulatory Visit: Payer: Self-pay

## 2023-12-31 MED ORDER — CONTOUR PLUS BLUE W/DEVICE KIT: 1.0000 | PACK | Freq: Every morning | 0 refills | Status: AC

## 2023-12-31 MED ORDER — GLUCOSE BLOOD VI STRP: ORAL_STRIP | 12 refills | Status: AC

## 2023-12-31 MED ORDER — CONTOUR PLUS BLUE W/DEVICE KIT: 1.0000 | PACK | Freq: Every morning | 0 refills | Status: DC

## 2023-12-31 MED ORDER — GLUCOSE BLOOD VI STRP: ORAL_STRIP | 12 refills | Status: DC

## 2023-12-31 NOTE — Addendum Note (Signed)
 Addended byBETHA GAYLE NUMBERS on: 12/31/2023 11:31 AM   Modules accepted: Orders

## 2024-01-04 ENCOUNTER — Other Ambulatory Visit: Payer: Self-pay | Admitting: Family Medicine

## 2024-01-04 DIAGNOSIS — E1169 Type 2 diabetes mellitus with other specified complication: Secondary | ICD-10-CM

## 2024-01-04 DIAGNOSIS — E1159 Type 2 diabetes mellitus with other circulatory complications: Secondary | ICD-10-CM

## 2024-01-28 ENCOUNTER — Other Ambulatory Visit: Payer: Self-pay | Admitting: Family Medicine

## 2024-01-28 DIAGNOSIS — I152 Hypertension secondary to endocrine disorders: Secondary | ICD-10-CM

## 2024-01-29 ENCOUNTER — Ambulatory Visit (INDEPENDENT_AMBULATORY_CARE_PROVIDER_SITE_OTHER): Payer: Medicare Other | Admitting: Dermatology

## 2024-01-29 VITALS — BP 123/77

## 2024-01-29 DIAGNOSIS — R234 Changes in skin texture: Secondary | ICD-10-CM | POA: Diagnosis not present

## 2024-01-29 DIAGNOSIS — R238 Other skin changes: Secondary | ICD-10-CM | POA: Diagnosis not present

## 2024-01-29 DIAGNOSIS — D485 Neoplasm of uncertain behavior of skin: Secondary | ICD-10-CM | POA: Diagnosis not present

## 2024-01-29 DIAGNOSIS — L819 Disorder of pigmentation, unspecified: Secondary | ICD-10-CM

## 2024-01-29 DIAGNOSIS — L821 Other seborrheic keratosis: Secondary | ICD-10-CM

## 2024-01-29 NOTE — Progress Notes (Signed)
 New Patient Visit   Subjective  Rachel Camacho is a 78 y.o. female who presents for the following: Moles of her right lower leg and left shoulder. They have been there for years but are getting larger.    The following portions of the chart were reviewed this encounter and updated as appropriate: medications, allergies, medical history  Review of Systems:  No other skin or systemic complaints except as noted in HPI or Assessment and Plan.  Objective  Well appearing patient in no apparent distress; mood and affect are within normal limits.   A focused examination was performed of the following areas: Right leg, left shoulder   Relevant exam findings are noted in the Assessment and Plan.  Left post upper arm 1.0 cm pigmented papule  Right Lower Leg 0.7 cm pigmented papule   Assessment & Plan   1. Pigmented Papule on Left Posterior Upper Arm - Assessment: Lesion suspected to be pigmented seborrheic keratosis, but dysplastic nevus cannot be ruled out. Growth present for approximately 2 years with persistence after previous cryotherapy treatment. Definitive diagnosis required given treatment failure and clinical appearance. - Plan:    Perform shave biopsy of left posterior upper arm lesion    Send specimen for histopathological examination    Provide wound care instructions: remove Band-Aid before showering, allow soapy water to run over site, apply Aquaphor and new Band-Aid after showering    Continue wound care for approximately 2 weeks    Review biopsy results within one week    Communicate results to patient via MyChart  2. Pigmented Crusted Papule on Right Lower Leg - Assessment: 7-millimeter pigmented crusted papule noted on right lower leg requiring histopathological evaluation for definitive diagnosis. - Plan:    Perform shave biopsy of right lower leg lesion    Send specimen for histopathological examination    Provide wound care instructions: remove Band-Aid before  showering, allow soapy water to run over site, apply Aquaphor and new Band-Aid after showering    Continue wound care for approximately 2 weeks    Review biopsy results within one week    Communicate results to patient via MyChart  Follow-up within one week to review biopsy results.  NEOPLASM OF UNCERTAIN BEHAVIOR OF SKIN (2) Left post upper arm Skin / nail biopsy Type of biopsy: tangential   Informed consent: discussed and consent obtained   Timeout: patient name, date of birth, surgical site, and procedure verified   Procedure prep:  Patient was prepped and draped in usual sterile fashion Prep type:  Isopropyl alcohol Anesthesia: the lesion was anesthetized in a standard fashion   Anesthetic:  1% lidocaine w/ epinephrine 1-100,000 buffered w/ 8.4% NaHCO3 Instrument used: flexible razor blade   Hemostasis achieved with: pressure, aluminum chloride and electrodesiccation   Outcome: patient tolerated procedure well   Post-procedure details: sterile dressing applied and wound care instructions given   Dressing type: bandage and petrolatum    Specimen 1 - Surgical pathology Differential Diagnosis: Pigmented SK vs dysplastic nevus  Check Margins: No Right Lower Leg Skin / nail biopsy Type of biopsy: tangential   Informed consent: discussed and consent obtained   Timeout: patient name, date of birth, surgical site, and procedure verified   Procedure prep:  Patient was prepped and draped in usual sterile fashion Prep type:  Isopropyl alcohol Anesthesia: the lesion was anesthetized in a standard fashion   Anesthetic:  1% lidocaine w/ epinephrine 1-100,000 buffered w/ 8.4% NaHCO3 Instrument used: flexible razor blade  Hemostasis achieved with: pressure, aluminum chloride and electrodesiccation   Outcome: patient tolerated procedure well   Post-procedure details: sterile dressing applied and wound care instructions given   Dressing type: bandage and petrolatum    Specimen 2 -  Surgical pathology Differential Diagnosis: Pigmented SK vs dysplastic nevus  Check Margins: No  Return if symptoms worsen or fail to improve.  I, Roseline Hutchinson, CMA, am acting as scribe for [employer], DO .   Documentation: I have reviewed the above documentation for accuracy and completeness, and I agree with the above.  Delon Lenis, DO

## 2024-01-29 NOTE — Patient Instructions (Signed)

## 2024-01-30 LAB — SURGICAL PATHOLOGY

## 2024-01-31 ENCOUNTER — Ambulatory Visit: Payer: Self-pay | Admitting: Dermatology

## 2024-02-06 ENCOUNTER — Ambulatory Visit: Payer: Self-pay

## 2024-02-06 ENCOUNTER — Ambulatory Visit (HOSPITAL_BASED_OUTPATIENT_CLINIC_OR_DEPARTMENT_OTHER)
Admission: RE | Admit: 2024-02-06 | Discharge: 2024-02-06 | Disposition: A | Discharge status: Home / Self Care | Source: Ambulatory Visit

## 2024-02-06 DIAGNOSIS — M85851 Other specified disorders of bone density and structure, right thigh: Secondary | ICD-10-CM | POA: Insufficient documentation

## 2024-03-22 ENCOUNTER — Other Ambulatory Visit: Payer: Self-pay | Admitting: Family Medicine

## 2024-03-22 DIAGNOSIS — N1831 Chronic kidney disease, stage 3a: Secondary | ICD-10-CM

## 2024-04-10 ENCOUNTER — Other Ambulatory Visit: Payer: Self-pay

## 2024-04-10 DIAGNOSIS — E559 Vitamin D deficiency, unspecified: Secondary | ICD-10-CM

## 2024-04-10 DIAGNOSIS — R5383 Other fatigue: Secondary | ICD-10-CM

## 2024-04-10 DIAGNOSIS — E1159 Type 2 diabetes mellitus with other circulatory complications: Secondary | ICD-10-CM

## 2024-04-10 DIAGNOSIS — N1831 Chronic kidney disease, stage 3a: Secondary | ICD-10-CM

## 2024-04-10 DIAGNOSIS — E1169 Type 2 diabetes mellitus with other specified complication: Secondary | ICD-10-CM

## 2024-04-14 ENCOUNTER — Other Ambulatory Visit

## 2024-04-14 DIAGNOSIS — E1169 Type 2 diabetes mellitus with other specified complication: Secondary | ICD-10-CM

## 2024-04-14 DIAGNOSIS — E1159 Type 2 diabetes mellitus with other circulatory complications: Secondary | ICD-10-CM

## 2024-04-14 DIAGNOSIS — R5383 Other fatigue: Secondary | ICD-10-CM

## 2024-04-14 DIAGNOSIS — E559 Vitamin D deficiency, unspecified: Secondary | ICD-10-CM

## 2024-04-14 DIAGNOSIS — N1831 Diabetes mellitus due to underlying condition with diabetic chronic kidney disease: Secondary | ICD-10-CM

## 2024-04-16 ENCOUNTER — Ambulatory Visit: Payer: Self-pay

## 2024-04-16 LAB — CBC WITH DIFFERENTIAL/PLATELET
Basophils Absolute: 0 x10E3/uL (ref 0.0–0.2)
Basos: 1 %
EOS (ABSOLUTE): 0.3 x10E3/uL (ref 0.0–0.4)
Eos: 4 %
Hematocrit: 42 % (ref 34.0–46.6)
Hemoglobin: 13.8 g/dL (ref 11.1–15.9)
Immature Grans (Abs): 0 x10E3/uL (ref 0.0–0.1)
Immature Granulocytes: 0 %
Lymphocytes Absolute: 2.2 x10E3/uL (ref 0.7–3.1)
Lymphs: 33 %
MCH: 29.1 pg (ref 26.6–33.0)
MCHC: 32.9 g/dL (ref 31.5–35.7)
MCV: 88 fL (ref 79–97)
Monocytes Absolute: 0.4 x10E3/uL (ref 0.1–0.9)
Monocytes: 6 %
Neutrophils Absolute: 3.7 x10E3/uL (ref 1.4–7.0)
Neutrophils: 56 %
Platelets: 268 x10E3/uL (ref 150–450)
RBC: 4.75 x10E6/uL (ref 3.77–5.28)
RDW: 13.2 % (ref 11.7–15.4)
WBC: 6.7 x10E3/uL (ref 3.4–10.8)

## 2024-04-16 LAB — COMPREHENSIVE METABOLIC PANEL WITH GFR
ALT: 17 IU/L (ref 0–32)
AST: 20 IU/L (ref 0–40)
Albumin: 4.6 g/dL (ref 3.8–4.8)
Alkaline Phosphatase: 70 IU/L (ref 49–135)
BUN/Creatinine Ratio: 24 (ref 12–28)
BUN: 18 mg/dL (ref 8–27)
Bilirubin Total: 0.7 mg/dL (ref 0.0–1.2)
CO2: 22 mmol/L (ref 20–29)
Calcium: 9.6 mg/dL (ref 8.7–10.3)
Chloride: 101 mmol/L (ref 96–106)
Creatinine, Ser: 0.76 mg/dL (ref 0.57–1.00)
Globulin, Total: 2.1 g/dL (ref 1.5–4.5)
Glucose: 116 mg/dL — ABNORMAL HIGH (ref 70–99)
Potassium: 4.3 mmol/L (ref 3.5–5.2)
Sodium: 140 mmol/L (ref 134–144)
Total Protein: 6.7 g/dL (ref 6.0–8.5)
eGFR: 80 mL/min/1.73 (ref >59)

## 2024-04-16 LAB — LIPID PANEL
Chol/HDL Ratio: 2.1 ratio (ref 0.0–4.4)
Cholesterol, Total: 121 mg/dL (ref 100–199)
HDL: 59 mg/dL (ref >39)
LDL Chol Calc (NIH): 48 mg/dL (ref 0–99)
Triglycerides: 65 mg/dL (ref 0–149)
VLDL Cholesterol Cal: 14 mg/dL (ref 5–40)

## 2024-04-16 LAB — MICROALBUMIN / CREATININE URINE RATIO
Creatinine, Urine: 88.2 mg/dL
Microalb/Creat Ratio: 157 mg/g{creat} — ABNORMAL HIGH (ref 0–29)
Microalbumin, Urine: 138.1 ug/mL

## 2024-04-16 LAB — TSH: TSH: 1.71 u[IU]/mL (ref 0.450–4.500)

## 2024-04-16 LAB — HEMOGLOBIN A1C
Est. average glucose Bld gHb Est-mCnc: 140 mg/dL
Hgb A1c MFr Bld: 6.5 % — ABNORMAL HIGH (ref 4.8–5.6)

## 2024-04-16 LAB — B12 AND FOLATE PANEL
Folate: >20 ng/mL (ref >3.0)
Vitamin B-12: 1310 pg/mL — ABNORMAL HIGH (ref 232–1245)

## 2024-04-16 LAB — VITAMIN D 25 HYDROXY (VIT D DEFICIENCY, FRACTURES): Vit D, 25-Hydroxy: 97.5 ng/mL (ref 30.0–100.0)

## 2024-04-21 ENCOUNTER — Ambulatory Visit

## 2024-04-21 VITALS — BP 133/85 | HR 66 | Temp 98.0°F | Ht 63.0 in | Wt 152.1 lb

## 2024-04-21 DIAGNOSIS — Z7984 Long term (current) use of oral hypoglycemic drugs: Secondary | ICD-10-CM

## 2024-04-21 DIAGNOSIS — R809 Proteinuria, unspecified: Secondary | ICD-10-CM

## 2024-04-21 DIAGNOSIS — E1159 Type 2 diabetes mellitus with other circulatory complications: Secondary | ICD-10-CM | POA: Diagnosis not present

## 2024-04-21 DIAGNOSIS — M25551 Pain in right hip: Secondary | ICD-10-CM | POA: Insufficient documentation

## 2024-04-21 DIAGNOSIS — E1129 Type 2 diabetes mellitus with other diabetic kidney complication: Secondary | ICD-10-CM | POA: Diagnosis not present

## 2024-04-21 DIAGNOSIS — I152 Hypertension secondary to endocrine disorders: Secondary | ICD-10-CM

## 2024-04-21 DIAGNOSIS — E1169 Type 2 diabetes mellitus with other specified complication: Secondary | ICD-10-CM | POA: Diagnosis not present

## 2024-04-21 DIAGNOSIS — E782 Mixed hyperlipidemia: Secondary | ICD-10-CM

## 2024-04-21 DIAGNOSIS — R42 Dizziness and giddiness: Secondary | ICD-10-CM

## 2024-04-21 DIAGNOSIS — M8580 Other specified disorders of bone density and structure, unspecified site: Secondary | ICD-10-CM | POA: Insufficient documentation

## 2024-04-21 DIAGNOSIS — Z23 Encounter for immunization: Secondary | ICD-10-CM | POA: Diagnosis not present

## 2024-04-21 DIAGNOSIS — N1831 Chronic kidney disease, stage 3a: Secondary | ICD-10-CM

## 2024-04-21 DIAGNOSIS — M25552 Pain in left hip: Secondary | ICD-10-CM

## 2024-04-21 NOTE — Progress Notes (Signed)
 Established Patient Office Visit  Subjective   Patient ID: Rachel Camacho, female    DOB: [birth year]  Age: 78 y.o. MRN: [id number]  Chief Complaint  Patient presents with   Medical Management of Chronic Issues    HPI   History of Present Illness   Rachel Camacho is a 78 year old female with type 2 diabetes and hypertension who presents for a routine follow-up and flu shot.  Dizziness and fatigue - Dizziness improved after discontinuing amlodipine  at last OV in May - Continues to experience fatigue despite dizziness/lightheadedness improving. Does report that she recently started exercising daily again which has improved fatigue.  - No snoring or waking up gasping for air - Wakes up at night to use the bathroom on average once per night   Glycemic control - Takes metformin  without gastrointestinal side effects  Lipid management - Takes atorvastatin  20 mg. Tolerating well without side effects   Musculoskeletal symptoms and bone health - Experiences stiffness in hips, particularly after sitting for long periods, which improves with movement. Denies true pain in the hips - History of osteopenia on Ca and Vit D Supplements   Blood pressure monitoring - Home blood pressure readings usually 125-130 mmHg over 70-80's DBP.  - Currently not taking amlodipine  due to previous dizziness associated with its use - Monitors blood pressure regularly          ROS Per HPI.    Objective:     BP 133/85   Pulse 66   Temp 98 F (36.7 C) (Oral)   Ht 5' 3 (1.6 m)   Wt 152 lb 1.3 oz (69 kg)   SpO2 97%   BMI 26.94 kg/m    Physical Exam Constitutional:      General: She is not in acute distress.    Appearance: Normal appearance.  Eyes:     Pupils: Pupils are equal, round, and reactive to light.  Cardiovascular:     Rate and Rhythm: Normal rate and regular rhythm.     Pulses:          Posterior tibial pulses are 1+ on the right side and 1+ on the left side.     Heart sounds:  Normal heart sounds. No murmur heard.    No friction rub. No gallop.  Pulmonary:     Effort: Pulmonary effort is normal. No respiratory distress.     Breath sounds: Normal breath sounds.  Abdominal:     General: Bowel sounds are normal.  Musculoskeletal:        General: No swelling.     Cervical back: Neck supple.  Feet:     Right foot:     Protective Sensation: 10 sites tested.  10 sites sensed.     Skin integrity: Skin integrity normal.     Left foot:     Protective Sensation: 10 sites tested.  10 sites sensed.     Skin integrity: Skin integrity normal.  Lymphadenopathy:     Cervical: No cervical adenopathy.  Skin:    General: Skin is warm and dry.  Neurological:     General: No focal deficit present.     Mental Status: She is alert.  Psychiatric:        Mood and Affect: Mood normal.        Behavior: Behavior normal.        Thought Content: Thought content normal.      No results found for any visits on 04/21/24.  Last CBC Lab Results  Component Value Date   WBC 6.7 04/14/2024   HGB 13.8 04/14/2024   HCT 42.0 04/14/2024   MCV 88 04/14/2024   MCH 29.1 04/14/2024   RDW 13.2 04/14/2024   PLT 268 04/14/2024   Last metabolic panel Lab Results  Component Value Date   GLUCOSE 116 (H) 04/14/2024   NA 140 04/14/2024   K 4.3 04/14/2024   CL 101 04/14/2024   CO2 22 04/14/2024   BUN 18 04/14/2024   CREATININE 0.76 04/14/2024   EGFR 80 04/14/2024   CALCIUM  9.6 04/14/2024   PHOS 4.3 07/12/2017   PROT 6.7 04/14/2024   ALBUMIN 4.6 04/14/2024   LABGLOB 2.1 04/14/2024   AGRATIO 2.1 07/03/2022   BILITOT 0.7 04/14/2024   ALKPHOS 70 04/14/2024   AST 20 04/14/2024   ALT 17 04/14/2024   Last lipids Lab Results  Component Value Date   CHOL 121 04/14/2024   HDL 59 04/14/2024   LDLCALC 48 04/14/2024   TRIG 65 04/14/2024   CHOLHDL 2.1 04/14/2024   Last hemoglobin A1c Lab Results  Component Value Date   HGBA1C 6.5 (H) 04/14/2024   Last thyroid  functions Lab  Results  Component Value Date   TSH 1.710 04/14/2024   FREET4 0.95 09/03/2019   Last vitamin D  Lab Results  Component Value Date   VD25OH 97.5 04/14/2024      The ASCVD Risk score (Arnett DK, et al., 2019) failed to calculate for the following reasons:   The valid total cholesterol range is 130 to 320 mg/dL    Assessment & Plan:   Encounter for vaccination -     Flu vaccine HIGH DOSE PF(Fluzone Trivalent)  Bilateral hip pain Assessment & Plan: Stiffness improves with movement, suggesting arthritis. No imaging needed at this time.  - Referred to physical therapy for hip stiffness and strengthening exercises. - Will consider anti-inflammatory medication if stiffness worsens. Patient agreeable to the plan.   Orders: -     Ambulatory referral to Physical Therapy  Mixed diabetic hyperlipidemia associated with type 2 diabetes mellitus (HCC) Assessment & Plan: Last lipid panel: LDL 48, HDL 59, Trig 65. LDL at goal. CMP WNL. Cont atorvastatin  20 mg daily.    Microalbuminuria due to type 2 diabetes mellitus (HCC) Assessment & Plan: Maximized ARB therapy. A1c well controlled at 6.5. UACR revealed worsened microalbuminuria with ratio of 157. She does follow with nephrology as well. Discussed importance of good BP and A1c control as well as adequate fluid intake. Will cont to monitor UACR. If ratio continues to worsen, patient may benefit from addition of Jardiance to regimen less so for A1c but for kidney benefits.    Hypertension associated with diabetes (HCC) Assessment & Plan: BP goal <130/80. Controlled. BP remaining at goal with discontinuation of amlodipine  back in May.  BP slightly above goal today in office. Patient attributes this to having to wait in the clinic today.  Continue checking BP at home daily and notify of readings persistently above goal. Will cont to monitor.   Dizziness Assessment & Plan: Improved after discontinuation of amlodipine  6 months ago. Will  cont to monitor.    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 UACR, foot exam, eye exam all updated. Continue Metformin  XR 1000 mg daily. EGFR stable at 80. Will cont to monitor.   Osteopenia, unspecified location Assessment & Plan: Managed with calcium  and vitamin D . Bone  density scan showed osteopenia. Exercise encouraged. - Continue calcium  and vitamin D  supplementation. - Encouraged weight-bearing exercises. - Will repeat bone density scan in two years.     Return in about 6 months (around 10/20/2024) for HTN, DM, HLD.    Saddie JULIANNA Sacks, PA-C

## 2024-04-21 NOTE — Assessment & Plan Note (Signed)
 Managed with calcium  and vitamin D . Bone density scan showed osteopenia. Exercise encouraged. - Continue calcium  and vitamin D  supplementation. - Encouraged weight-bearing exercises. - Will repeat bone density scan in two years.

## 2024-04-21 NOTE — Assessment & Plan Note (Signed)
 Maximized ARB therapy. A1c well controlled at 6.5. UACR revealed worsened microalbuminuria with ratio of 157. She does follow with nephrology as well. Discussed importance of good BP and A1c control as well as adequate fluid intake. Will cont to monitor UACR. If ratio continues to worsen, patient may benefit from addition of Jardiance to regimen less so for A1c but for kidney benefits.

## 2024-04-21 NOTE — Assessment & Plan Note (Signed)
 Last lipid panel: LDL 48, HDL 59, Trig 65. LDL at goal. CMP WNL. Cont atorvastatin  20 mg daily.

## 2024-04-21 NOTE — Assessment & Plan Note (Signed)
 Improved after discontinuation of amlodipine  6 months ago. Will cont to monitor.

## 2024-04-21 NOTE — Assessment & Plan Note (Signed)
 Stiffness improves with movement, suggesting arthritis. No imaging needed at this time.  - Referred to physical therapy for hip stiffness and strengthening exercises. - Will consider anti-inflammatory medication if stiffness worsens. Patient agreeable to the plan.

## 2024-04-21 NOTE — Assessment & Plan Note (Signed)
 BP goal <130/80. Controlled. BP remaining at goal with discontinuation of amlodipine  back in May.  BP slightly above goal today in office. Patient attributes this to having to wait in the clinic today.  Continue checking BP at home daily and notify of readings persistently above goal. Will cont to monitor.

## 2024-04-21 NOTE — Assessment & Plan Note (Signed)
 A1c stable at 5.6. UACR, foot exam, eye exam all updated. Continue Metformin  XR 1000 mg daily. EGFR stable at 80. Will cont to monitor.

## 2024-04-21 NOTE — Patient Instructions (Signed)
 VISIT SUMMARY: Today, we reviewed your type 2 diabetes, hypertension, cholesterol, and bone health. We also addressed your dizziness, fatigue, and hip stiffness. You received your flu shot and discussed other vaccinations.  YOUR PLAN: TYPE 2 DIABETES MELLITUS: Your diabetes is well-controlled with a hemoglobin A1c of 6.5%. -Continue your current diabetes management regimen. -Continue taking losartan  for kidney protection. - Continue with good water intake and electrolyte supplement. -We performed your annual diabetic urine exam.  ESSENTIAL HYPERTENSION: Your blood pressure is well-controlled at home. -Continue your current antihypertensive regimen without amlodipine . -Monitor your blood pressure at home a couple of times a week.  HYPERLIPIDEMIA: Your cholesterol levels are well-controlled with atorvastatin . -Continue taking atorvastatin  20 mg at bedtime.  OSTEOPENIA: You have osteopenia, and we are managing it with calcium  and vitamin D . -Continue taking calcium  and vitamin D  supplements. -Engage in weight-bearing exercises. -We will repeat your bone density scan in two years.  ARTHRALGIA AND STIFFNESS OF BILATERAL HIPS: You experience hip stiffness that improves with movement, suggesting arthritis. -Attend physical therapy for hip stiffness and strengthening exercises.I have referred you to [facility] in [place]. They will call you to get the appointment set up.  -We will consider anti-inflammatory medication if your stiffness worsens or does not get better with physical therapy.  VITAMIN B12 EXCESS: Your B12 levels are slightly elevated, which may contribute to fatigue and dizziness. -Reduce your B12 supplementation to every other day.   GENERAL HEALTH MAINTENANCE: Your routine health maintenance is up to date. -You received your flu shot today. -We recommend getting a tetanus vaccine at your next visit. -We recommend getting a COVID vaccine at the pharmacy.  If  you have any problems before your next visit feel free to message me via MyChart (minor issues or questions) or call the office, otherwise you may reach out to schedule an office visit.  Thank you! Saddie Sacks, PA-C

## 2024-10-15 ENCOUNTER — Other Ambulatory Visit

## 2024-10-20 ENCOUNTER — Ambulatory Visit: Admitting: Family Medicine

## 2025-02-19 ENCOUNTER — Ambulatory Visit
